# Patient Record
Sex: Male | Born: 1940
Health system: Southern US, Community
[De-identification: ages and names within clinical notes are randomized; demographics above are authoritative.]

## PROBLEM LIST (undated history)

## (undated) DIAGNOSIS — E785 Hyperlipidemia, unspecified: Secondary | ICD-10-CM

## (undated) DIAGNOSIS — G4733 Obstructive sleep apnea (adult) (pediatric): Secondary | ICD-10-CM

## (undated) DIAGNOSIS — I779 Disorder of arteries and arterioles, unspecified: Secondary | ICD-10-CM

## (undated) DIAGNOSIS — I071 Rheumatic tricuspid insufficiency: Secondary | ICD-10-CM

## (undated) DIAGNOSIS — E1142 Type 2 diabetes mellitus with diabetic polyneuropathy: Secondary | ICD-10-CM

## (undated) DIAGNOSIS — N200 Calculus of kidney: Secondary | ICD-10-CM

## (undated) DIAGNOSIS — I10 Essential (primary) hypertension: Secondary | ICD-10-CM

## (undated) DIAGNOSIS — G629 Polyneuropathy, unspecified: Secondary | ICD-10-CM

## (undated) DIAGNOSIS — I2510 Atherosclerotic heart disease of native coronary artery without angina pectoris: Secondary | ICD-10-CM

## (undated) DIAGNOSIS — I38 Endocarditis, valve unspecified: Secondary | ICD-10-CM

## (undated) DIAGNOSIS — G709 Myoneural disorder, unspecified: Secondary | ICD-10-CM

## (undated) DIAGNOSIS — M4317 Spondylolisthesis, lumbosacral region: Secondary | ICD-10-CM

## (undated) DIAGNOSIS — R161 Splenomegaly, not elsewhere classified: Secondary | ICD-10-CM

## (undated) DIAGNOSIS — K219 Gastro-esophageal reflux disease without esophagitis: Secondary | ICD-10-CM

## (undated) DIAGNOSIS — K760 Fatty (change of) liver, not elsewhere classified: Secondary | ICD-10-CM

## (undated) DIAGNOSIS — M170 Bilateral primary osteoarthritis of knee: Secondary | ICD-10-CM

## (undated) DIAGNOSIS — H269 Unspecified cataract: Secondary | ICD-10-CM

## (undated) DIAGNOSIS — G4730 Sleep apnea, unspecified: Secondary | ICD-10-CM

## (undated) DIAGNOSIS — C4490 Unspecified malignant neoplasm of skin, unspecified: Secondary | ICD-10-CM

## (undated) DIAGNOSIS — D1771 Benign lipomatous neoplasm of kidney: Secondary | ICD-10-CM

## (undated) DIAGNOSIS — I5189 Other ill-defined heart diseases: Secondary | ICD-10-CM

## (undated) DIAGNOSIS — E039 Hypothyroidism, unspecified: Secondary | ICD-10-CM

## (undated) DIAGNOSIS — K5790 Diverticulosis of intestine, part unspecified, without perforation or abscess without bleeding: Secondary | ICD-10-CM

## (undated) DIAGNOSIS — I872 Venous insufficiency (chronic) (peripheral): Secondary | ICD-10-CM

## (undated) DIAGNOSIS — I517 Cardiomegaly: Secondary | ICD-10-CM

## (undated) DIAGNOSIS — E119 Type 2 diabetes mellitus without complications: Secondary | ICD-10-CM

## (undated) DIAGNOSIS — K4090 Unilateral inguinal hernia, without obstruction or gangrene, not specified as recurrent: Secondary | ICD-10-CM

## (undated) DIAGNOSIS — I340 Nonrheumatic mitral (valve) insufficiency: Secondary | ICD-10-CM

## (undated) DIAGNOSIS — M1990 Unspecified osteoarthritis, unspecified site: Secondary | ICD-10-CM

## (undated) DIAGNOSIS — K449 Diaphragmatic hernia without obstruction or gangrene: Secondary | ICD-10-CM

## (undated) DIAGNOSIS — R001 Bradycardia, unspecified: Secondary | ICD-10-CM

## (undated) DIAGNOSIS — M47816 Spondylosis without myelopathy or radiculopathy, lumbar region: Secondary | ICD-10-CM

## (undated) HISTORY — PX: HERNIA REPAIR: SHX51

## (undated) HISTORY — DX: Sleep apnea, unspecified: G47.30

## (undated) HISTORY — PX: KNEE ARTHROSCOPY: SUR90

## (undated) HISTORY — DX: Unspecified osteoarthritis, unspecified site: M19.90

## (undated) HISTORY — DX: Hypothyroidism, unspecified: E03.9

## (undated) HISTORY — DX: Diverticulosis of intestine, part unspecified, without perforation or abscess without bleeding: K57.90

## (undated) HISTORY — DX: Hyperlipidemia, unspecified: E78.5

## (undated) HISTORY — PX: COLONOSCOPY: SHX174

## (undated) HISTORY — DX: Type 2 diabetes mellitus without complications: E11.9

## (undated) HISTORY — DX: Essential (primary) hypertension: I10

## (undated) HISTORY — DX: Atherosclerotic heart disease of native coronary artery without angina pectoris: I25.10

## (undated) HISTORY — DX: Unspecified cataract: H26.9

## (undated) HISTORY — DX: Myoneural disorder, unspecified: G70.9

## (undated) HISTORY — PX: CYST REMOVAL NECK: SHX6281

## (undated) HISTORY — PX: INGUINAL HERNIA REPAIR: SUR1180

## (undated) HISTORY — PX: POLYPECTOMY: SHX149

## (undated) HISTORY — PX: TONSILLECTOMY AND ADENOIDECTOMY: SUR1326

## (undated) HISTORY — PX: EYE SURGERY: SHX253

---

## 1976-10-31 HISTORY — PX: NASAL POLYP SURGERY: SHX186

## 1976-10-31 HISTORY — PX: OTHER SURGICAL HISTORY: SHX169

## 1998-10-31 HISTORY — PX: OTHER SURGICAL HISTORY: SHX169

## 2001-10-31 HISTORY — PX: KNEE ARTHROSCOPY: SUR90

## 2003-11-01 HISTORY — PX: KNEE ARTHROSCOPY: SUR90

## 2005-09-19 ENCOUNTER — Ambulatory Visit: Payer: Self-pay | Admitting: Specialist

## 2006-04-14 ENCOUNTER — Other Ambulatory Visit: Payer: Self-pay

## 2006-04-20 ENCOUNTER — Ambulatory Visit: Payer: Self-pay | Admitting: Orthopaedic Surgery

## 2007-12-19 ENCOUNTER — Ambulatory Visit: Payer: Self-pay | Admitting: Gastroenterology

## 2008-01-02 ENCOUNTER — Ambulatory Visit: Payer: Self-pay | Admitting: Gastroenterology

## 2008-01-29 ENCOUNTER — Ambulatory Visit: Payer: Self-pay

## 2008-02-21 ENCOUNTER — Other Ambulatory Visit: Payer: Self-pay

## 2008-02-21 ENCOUNTER — Ambulatory Visit: Payer: Self-pay | Admitting: Orthopaedic Surgery

## 2008-02-29 ENCOUNTER — Ambulatory Visit: Payer: Self-pay | Admitting: Orthopaedic Surgery

## 2010-10-31 HISTORY — PX: TIBIA FRACTURE SURGERY: SHX806

## 2011-04-01 ENCOUNTER — Ambulatory Visit: Payer: Self-pay | Admitting: Family Medicine

## 2011-04-06 ENCOUNTER — Ambulatory Visit: Payer: Self-pay | Admitting: Family Medicine

## 2011-05-05 ENCOUNTER — Ambulatory Visit: Payer: Self-pay | Admitting: Family Medicine

## 2011-05-24 ENCOUNTER — Ambulatory Visit: Payer: Self-pay

## 2011-10-18 ENCOUNTER — Ambulatory Visit: Payer: Self-pay | Admitting: Internal Medicine

## 2011-10-20 ENCOUNTER — Inpatient Hospital Stay: Payer: Self-pay | Admitting: Orthopedic Surgery

## 2011-11-01 HISTORY — PX: KNEE ARTHROSCOPY: SUR90

## 2011-11-02 DIAGNOSIS — S93409A Sprain of unspecified ligament of unspecified ankle, initial encounter: Secondary | ICD-10-CM | POA: Diagnosis not present

## 2011-11-23 DIAGNOSIS — S93409A Sprain of unspecified ligament of unspecified ankle, initial encounter: Secondary | ICD-10-CM | POA: Diagnosis not present

## 2011-11-30 DIAGNOSIS — M25669 Stiffness of unspecified knee, not elsewhere classified: Secondary | ICD-10-CM | POA: Diagnosis not present

## 2011-11-30 DIAGNOSIS — M6281 Muscle weakness (generalized): Secondary | ICD-10-CM | POA: Diagnosis not present

## 2011-11-30 DIAGNOSIS — M25619 Stiffness of unspecified shoulder, not elsewhere classified: Secondary | ICD-10-CM | POA: Diagnosis not present

## 2011-11-30 DIAGNOSIS — M25569 Pain in unspecified knee: Secondary | ICD-10-CM | POA: Diagnosis not present

## 2011-11-30 DIAGNOSIS — M7989 Other specified soft tissue disorders: Secondary | ICD-10-CM | POA: Diagnosis not present

## 2011-11-30 DIAGNOSIS — M25519 Pain in unspecified shoulder: Secondary | ICD-10-CM | POA: Diagnosis not present

## 2011-12-02 DIAGNOSIS — M25619 Stiffness of unspecified shoulder, not elsewhere classified: Secondary | ICD-10-CM | POA: Diagnosis not present

## 2011-12-02 DIAGNOSIS — M6281 Muscle weakness (generalized): Secondary | ICD-10-CM | POA: Diagnosis not present

## 2011-12-02 DIAGNOSIS — M25569 Pain in unspecified knee: Secondary | ICD-10-CM | POA: Diagnosis not present

## 2011-12-02 DIAGNOSIS — M25519 Pain in unspecified shoulder: Secondary | ICD-10-CM | POA: Diagnosis not present

## 2011-12-02 DIAGNOSIS — M25669 Stiffness of unspecified knee, not elsewhere classified: Secondary | ICD-10-CM | POA: Diagnosis not present

## 2011-12-02 DIAGNOSIS — M7989 Other specified soft tissue disorders: Secondary | ICD-10-CM | POA: Diagnosis not present

## 2011-12-07 DIAGNOSIS — M25579 Pain in unspecified ankle and joints of unspecified foot: Secondary | ICD-10-CM | POA: Diagnosis not present

## 2011-12-07 DIAGNOSIS — M25673 Stiffness of unspecified ankle, not elsewhere classified: Secondary | ICD-10-CM | POA: Diagnosis not present

## 2011-12-07 DIAGNOSIS — M7989 Other specified soft tissue disorders: Secondary | ICD-10-CM | POA: Diagnosis not present

## 2011-12-07 DIAGNOSIS — M6281 Muscle weakness (generalized): Secondary | ICD-10-CM | POA: Diagnosis not present

## 2011-12-07 DIAGNOSIS — M25676 Stiffness of unspecified foot, not elsewhere classified: Secondary | ICD-10-CM | POA: Diagnosis not present

## 2011-12-09 DIAGNOSIS — M25519 Pain in unspecified shoulder: Secondary | ICD-10-CM | POA: Diagnosis not present

## 2011-12-09 DIAGNOSIS — M6281 Muscle weakness (generalized): Secondary | ICD-10-CM | POA: Diagnosis not present

## 2011-12-09 DIAGNOSIS — M25619 Stiffness of unspecified shoulder, not elsewhere classified: Secondary | ICD-10-CM | POA: Diagnosis not present

## 2011-12-09 DIAGNOSIS — M7989 Other specified soft tissue disorders: Secondary | ICD-10-CM | POA: Diagnosis not present

## 2011-12-10 DIAGNOSIS — I10 Essential (primary) hypertension: Secondary | ICD-10-CM | POA: Diagnosis not present

## 2011-12-10 DIAGNOSIS — E119 Type 2 diabetes mellitus without complications: Secondary | ICD-10-CM | POA: Diagnosis not present

## 2011-12-10 DIAGNOSIS — E7800 Pure hypercholesterolemia, unspecified: Secondary | ICD-10-CM | POA: Diagnosis not present

## 2011-12-10 DIAGNOSIS — E669 Obesity, unspecified: Secondary | ICD-10-CM | POA: Diagnosis not present

## 2011-12-14 DIAGNOSIS — M25519 Pain in unspecified shoulder: Secondary | ICD-10-CM | POA: Diagnosis not present

## 2011-12-14 DIAGNOSIS — M25619 Stiffness of unspecified shoulder, not elsewhere classified: Secondary | ICD-10-CM | POA: Diagnosis not present

## 2011-12-14 DIAGNOSIS — M6281 Muscle weakness (generalized): Secondary | ICD-10-CM | POA: Diagnosis not present

## 2011-12-14 DIAGNOSIS — M7989 Other specified soft tissue disorders: Secondary | ICD-10-CM | POA: Diagnosis not present

## 2011-12-16 DIAGNOSIS — M7989 Other specified soft tissue disorders: Secondary | ICD-10-CM | POA: Diagnosis not present

## 2011-12-16 DIAGNOSIS — M25619 Stiffness of unspecified shoulder, not elsewhere classified: Secondary | ICD-10-CM | POA: Diagnosis not present

## 2011-12-16 DIAGNOSIS — M25519 Pain in unspecified shoulder: Secondary | ICD-10-CM | POA: Diagnosis not present

## 2011-12-16 DIAGNOSIS — M6281 Muscle weakness (generalized): Secondary | ICD-10-CM | POA: Diagnosis not present

## 2011-12-21 DIAGNOSIS — M6281 Muscle weakness (generalized): Secondary | ICD-10-CM | POA: Diagnosis not present

## 2011-12-21 DIAGNOSIS — M25519 Pain in unspecified shoulder: Secondary | ICD-10-CM | POA: Diagnosis not present

## 2011-12-21 DIAGNOSIS — M25619 Stiffness of unspecified shoulder, not elsewhere classified: Secondary | ICD-10-CM | POA: Diagnosis not present

## 2011-12-21 DIAGNOSIS — M7989 Other specified soft tissue disorders: Secondary | ICD-10-CM | POA: Diagnosis not present

## 2011-12-23 DIAGNOSIS — M6281 Muscle weakness (generalized): Secondary | ICD-10-CM | POA: Diagnosis not present

## 2011-12-23 DIAGNOSIS — M25519 Pain in unspecified shoulder: Secondary | ICD-10-CM | POA: Diagnosis not present

## 2011-12-23 DIAGNOSIS — M7989 Other specified soft tissue disorders: Secondary | ICD-10-CM | POA: Diagnosis not present

## 2011-12-23 DIAGNOSIS — M25619 Stiffness of unspecified shoulder, not elsewhere classified: Secondary | ICD-10-CM | POA: Diagnosis not present

## 2012-03-06 DIAGNOSIS — M25569 Pain in unspecified knee: Secondary | ICD-10-CM | POA: Diagnosis not present

## 2012-03-06 DIAGNOSIS — M2540 Effusion, unspecified joint: Secondary | ICD-10-CM | POA: Diagnosis not present

## 2012-03-14 DIAGNOSIS — M12569 Traumatic arthropathy, unspecified knee: Secondary | ICD-10-CM | POA: Diagnosis not present

## 2012-03-19 DIAGNOSIS — M1710 Unilateral primary osteoarthritis, unspecified knee: Secondary | ICD-10-CM | POA: Diagnosis not present

## 2012-03-19 DIAGNOSIS — M12569 Traumatic arthropathy, unspecified knee: Secondary | ICD-10-CM | POA: Diagnosis not present

## 2012-04-02 DIAGNOSIS — M1710 Unilateral primary osteoarthritis, unspecified knee: Secondary | ICD-10-CM | POA: Diagnosis not present

## 2012-04-02 DIAGNOSIS — M12569 Traumatic arthropathy, unspecified knee: Secondary | ICD-10-CM | POA: Diagnosis not present

## 2012-04-09 DIAGNOSIS — I10 Essential (primary) hypertension: Secondary | ICD-10-CM | POA: Diagnosis not present

## 2012-04-09 DIAGNOSIS — E119 Type 2 diabetes mellitus without complications: Secondary | ICD-10-CM | POA: Diagnosis not present

## 2012-04-09 DIAGNOSIS — E785 Hyperlipidemia, unspecified: Secondary | ICD-10-CM | POA: Diagnosis not present

## 2012-04-09 DIAGNOSIS — M1990 Unspecified osteoarthritis, unspecified site: Secondary | ICD-10-CM | POA: Diagnosis not present

## 2012-04-09 DIAGNOSIS — E039 Hypothyroidism, unspecified: Secondary | ICD-10-CM | POA: Diagnosis not present

## 2012-04-17 ENCOUNTER — Ambulatory Visit: Payer: Self-pay | Admitting: Orthopedic Surgery

## 2012-04-17 DIAGNOSIS — I10 Essential (primary) hypertension: Secondary | ICD-10-CM

## 2012-04-17 DIAGNOSIS — Z01812 Encounter for preprocedural laboratory examination: Secondary | ICD-10-CM | POA: Diagnosis not present

## 2012-04-17 DIAGNOSIS — Z01810 Encounter for preprocedural cardiovascular examination: Secondary | ICD-10-CM | POA: Diagnosis not present

## 2012-04-17 DIAGNOSIS — M25569 Pain in unspecified knee: Secondary | ICD-10-CM | POA: Diagnosis not present

## 2012-04-17 LAB — POTASSIUM: Potassium: 4.1 mmol/L (ref 3.5–5.1)

## 2012-04-20 ENCOUNTER — Ambulatory Visit: Payer: Self-pay | Admitting: Orthopedic Surgery

## 2012-04-20 DIAGNOSIS — E669 Obesity, unspecified: Secondary | ICD-10-CM | POA: Diagnosis not present

## 2012-04-20 DIAGNOSIS — M23305 Other meniscus derangements, unspecified medial meniscus, unspecified knee: Secondary | ICD-10-CM | POA: Diagnosis not present

## 2012-04-20 DIAGNOSIS — IMO0002 Reserved for concepts with insufficient information to code with codable children: Secondary | ICD-10-CM | POA: Diagnosis not present

## 2012-04-20 DIAGNOSIS — E079 Disorder of thyroid, unspecified: Secondary | ICD-10-CM | POA: Diagnosis not present

## 2012-04-20 DIAGNOSIS — E119 Type 2 diabetes mellitus without complications: Secondary | ICD-10-CM | POA: Diagnosis not present

## 2012-04-20 DIAGNOSIS — M129 Arthropathy, unspecified: Secondary | ICD-10-CM | POA: Diagnosis not present

## 2012-04-20 DIAGNOSIS — Z6839 Body mass index (BMI) 39.0-39.9, adult: Secondary | ICD-10-CM | POA: Diagnosis not present

## 2012-04-20 DIAGNOSIS — M23359 Other meniscus derangements, posterior horn of lateral meniscus, unspecified knee: Secondary | ICD-10-CM | POA: Diagnosis not present

## 2012-04-20 DIAGNOSIS — R609 Edema, unspecified: Secondary | ICD-10-CM | POA: Diagnosis not present

## 2012-04-20 DIAGNOSIS — M23349 Other meniscus derangements, anterior horn of lateral meniscus, unspecified knee: Secondary | ICD-10-CM | POA: Diagnosis not present

## 2012-04-20 DIAGNOSIS — Z7982 Long term (current) use of aspirin: Secondary | ICD-10-CM | POA: Diagnosis not present

## 2012-04-20 DIAGNOSIS — Z79899 Other long term (current) drug therapy: Secondary | ICD-10-CM | POA: Diagnosis not present

## 2012-04-20 DIAGNOSIS — Z87891 Personal history of nicotine dependence: Secondary | ICD-10-CM | POA: Diagnosis not present

## 2012-04-20 DIAGNOSIS — M1710 Unilateral primary osteoarthritis, unspecified knee: Secondary | ICD-10-CM | POA: Diagnosis not present

## 2012-04-20 DIAGNOSIS — I10 Essential (primary) hypertension: Secondary | ICD-10-CM | POA: Diagnosis not present

## 2012-06-18 DIAGNOSIS — T6391XA Toxic effect of contact with unspecified venomous animal, accidental (unintentional), initial encounter: Secondary | ICD-10-CM | POA: Diagnosis not present

## 2012-06-18 DIAGNOSIS — E119 Type 2 diabetes mellitus without complications: Secondary | ICD-10-CM | POA: Diagnosis not present

## 2012-06-18 DIAGNOSIS — I10 Essential (primary) hypertension: Secondary | ICD-10-CM | POA: Diagnosis not present

## 2012-06-18 DIAGNOSIS — M129 Arthropathy, unspecified: Secondary | ICD-10-CM | POA: Diagnosis not present

## 2012-06-18 DIAGNOSIS — Z23 Encounter for immunization: Secondary | ICD-10-CM | POA: Diagnosis not present

## 2012-07-25 DIAGNOSIS — E119 Type 2 diabetes mellitus without complications: Secondary | ICD-10-CM | POA: Diagnosis not present

## 2012-07-25 DIAGNOSIS — I10 Essential (primary) hypertension: Secondary | ICD-10-CM | POA: Diagnosis not present

## 2012-07-25 DIAGNOSIS — E785 Hyperlipidemia, unspecified: Secondary | ICD-10-CM | POA: Diagnosis not present

## 2012-07-25 DIAGNOSIS — M129 Arthropathy, unspecified: Secondary | ICD-10-CM | POA: Diagnosis not present

## 2012-08-21 DIAGNOSIS — Z23 Encounter for immunization: Secondary | ICD-10-CM | POA: Diagnosis not present

## 2012-08-21 DIAGNOSIS — I10 Essential (primary) hypertension: Secondary | ICD-10-CM | POA: Diagnosis not present

## 2012-08-21 DIAGNOSIS — M129 Arthropathy, unspecified: Secondary | ICD-10-CM | POA: Diagnosis not present

## 2012-10-29 DIAGNOSIS — E119 Type 2 diabetes mellitus without complications: Secondary | ICD-10-CM | POA: Diagnosis not present

## 2012-10-29 DIAGNOSIS — I10 Essential (primary) hypertension: Secondary | ICD-10-CM | POA: Diagnosis not present

## 2012-10-29 DIAGNOSIS — E7800 Pure hypercholesterolemia, unspecified: Secondary | ICD-10-CM | POA: Diagnosis not present

## 2012-10-29 DIAGNOSIS — M129 Arthropathy, unspecified: Secondary | ICD-10-CM | POA: Diagnosis not present

## 2012-12-05 DIAGNOSIS — Z87891 Personal history of nicotine dependence: Secondary | ICD-10-CM | POA: Diagnosis not present

## 2012-12-05 DIAGNOSIS — E119 Type 2 diabetes mellitus without complications: Secondary | ICD-10-CM | POA: Diagnosis not present

## 2012-12-05 DIAGNOSIS — T7840XA Allergy, unspecified, initial encounter: Secondary | ICD-10-CM | POA: Diagnosis not present

## 2012-12-05 DIAGNOSIS — R221 Localized swelling, mass and lump, neck: Secondary | ICD-10-CM | POA: Diagnosis not present

## 2012-12-05 DIAGNOSIS — I10 Essential (primary) hypertension: Secondary | ICD-10-CM | POA: Diagnosis not present

## 2012-12-05 DIAGNOSIS — T783XXA Angioneurotic edema, initial encounter: Secondary | ICD-10-CM | POA: Diagnosis not present

## 2012-12-05 DIAGNOSIS — Z7982 Long term (current) use of aspirin: Secondary | ICD-10-CM | POA: Diagnosis not present

## 2012-12-05 DIAGNOSIS — E039 Hypothyroidism, unspecified: Secondary | ICD-10-CM | POA: Diagnosis not present

## 2012-12-05 DIAGNOSIS — Z79899 Other long term (current) drug therapy: Secondary | ICD-10-CM | POA: Diagnosis not present

## 2012-12-05 DIAGNOSIS — Z800 Family history of malignant neoplasm of digestive organs: Secondary | ICD-10-CM | POA: Diagnosis not present

## 2012-12-05 DIAGNOSIS — R220 Localized swelling, mass and lump, head: Secondary | ICD-10-CM | POA: Diagnosis not present

## 2012-12-05 LAB — COMPREHENSIVE METABOLIC PANEL
Alkaline Phosphatase: 151 U/L — ABNORMAL HIGH (ref 50–136)
Anion Gap: 6 — ABNORMAL LOW (ref 7–16)
Chloride: 113 mmol/L — ABNORMAL HIGH (ref 98–107)
Co2: 24 mmol/L (ref 21–32)
Creatinine: 1.34 mg/dL — ABNORMAL HIGH (ref 0.60–1.30)
EGFR (African American): >60
EGFR (Non-African Amer.): 53 — ABNORMAL LOW
Glucose: 159 mg/dL — ABNORMAL HIGH (ref 65–99)
Osmolality: 298 (ref 275–301)
SGOT(AST): 24 U/L (ref 15–37)
SGPT (ALT): 34 U/L (ref 12–78)
Sodium: 143 mmol/L (ref 136–145)
Total Protein: 7.3 g/dL (ref 6.4–8.2)

## 2012-12-05 LAB — CBC
HGB: 14.5 g/dL (ref 13.0–18.0)
MCH: 30.1 pg (ref 26.0–34.0)
MCV: 90 fL (ref 80–100)
Platelet: 217 10*3/uL (ref 150–440)
RBC: 4.81 10*6/uL (ref 4.40–5.90)

## 2012-12-06 ENCOUNTER — Observation Stay: Payer: Self-pay | Admitting: Internal Medicine

## 2012-12-06 DIAGNOSIS — T783XXA Angioneurotic edema, initial encounter: Secondary | ICD-10-CM | POA: Diagnosis not present

## 2012-12-06 DIAGNOSIS — I10 Essential (primary) hypertension: Secondary | ICD-10-CM | POA: Diagnosis not present

## 2012-12-06 DIAGNOSIS — R21 Rash and other nonspecific skin eruption: Secondary | ICD-10-CM | POA: Diagnosis not present

## 2012-12-06 LAB — CBC WITH DIFFERENTIAL/PLATELET
Basophil #: 0 10*3/uL (ref 0.0–0.1)
Eosinophil #: 0 10*3/uL (ref 0.0–0.7)
HGB: 12.8 g/dL — ABNORMAL LOW (ref 13.0–18.0)
Lymphocyte #: 0.5 10*3/uL — ABNORMAL LOW (ref 1.0–3.6)
Lymphocyte %: 9.7 %
MCH: 30 pg (ref 26.0–34.0)
MCHC: 32.8 g/dL (ref 32.0–36.0)
MCV: 91 fL (ref 80–100)
Monocyte #: 0.1 x10 3/mm — ABNORMAL LOW (ref 0.2–1.0)
Neutrophil %: 88.6 %
Platelet: 163 10*3/uL (ref 150–440)
RBC: 4.26 10*6/uL — ABNORMAL LOW (ref 4.40–5.90)
RDW: 14.7 % — ABNORMAL HIGH (ref 11.5–14.5)
WBC: 5.2 10*3/uL (ref 3.8–10.6)

## 2012-12-06 LAB — BASIC METABOLIC PANEL
Anion Gap: 8 (ref 7–16)
BUN: 35 mg/dL — ABNORMAL HIGH (ref 7–18)
Calcium, Total: 10.1 mg/dL (ref 8.5–10.1)
Chloride: 114 mmol/L — ABNORMAL HIGH (ref 98–107)
Creatinine: 1.26 mg/dL (ref 0.60–1.30)
EGFR (African American): >60
Glucose: 327 mg/dL — ABNORMAL HIGH (ref 65–99)
Osmolality: 304 (ref 275–301)
Potassium: 4.3 mmol/L (ref 3.5–5.1)

## 2012-12-20 DIAGNOSIS — J309 Allergic rhinitis, unspecified: Secondary | ICD-10-CM | POA: Diagnosis not present

## 2012-12-20 DIAGNOSIS — I10 Essential (primary) hypertension: Secondary | ICD-10-CM | POA: Diagnosis not present

## 2012-12-20 DIAGNOSIS — E119 Type 2 diabetes mellitus without complications: Secondary | ICD-10-CM | POA: Diagnosis not present

## 2012-12-20 DIAGNOSIS — T783XXA Angioneurotic edema, initial encounter: Secondary | ICD-10-CM | POA: Diagnosis not present

## 2012-12-25 ENCOUNTER — Encounter: Payer: Self-pay | Admitting: Gastroenterology

## 2013-01-09 ENCOUNTER — Encounter: Payer: Self-pay | Admitting: Gastroenterology

## 2013-01-14 ENCOUNTER — Ambulatory Visit: Payer: Self-pay | Admitting: Family Medicine

## 2013-01-14 DIAGNOSIS — R05 Cough: Secondary | ICD-10-CM | POA: Diagnosis not present

## 2013-01-14 DIAGNOSIS — R059 Cough, unspecified: Secondary | ICD-10-CM | POA: Diagnosis not present

## 2013-01-14 DIAGNOSIS — R9431 Abnormal electrocardiogram [ECG] [EKG]: Secondary | ICD-10-CM | POA: Diagnosis not present

## 2013-01-14 DIAGNOSIS — I2510 Atherosclerotic heart disease of native coronary artery without angina pectoris: Secondary | ICD-10-CM | POA: Diagnosis not present

## 2013-01-14 DIAGNOSIS — E119 Type 2 diabetes mellitus without complications: Secondary | ICD-10-CM | POA: Diagnosis not present

## 2013-01-14 DIAGNOSIS — R079 Chest pain, unspecified: Secondary | ICD-10-CM | POA: Diagnosis not present

## 2013-01-14 DIAGNOSIS — I10 Essential (primary) hypertension: Secondary | ICD-10-CM | POA: Diagnosis not present

## 2013-01-21 DIAGNOSIS — E119 Type 2 diabetes mellitus without complications: Secondary | ICD-10-CM | POA: Diagnosis not present

## 2013-01-21 DIAGNOSIS — I209 Angina pectoris, unspecified: Secondary | ICD-10-CM | POA: Diagnosis not present

## 2013-01-21 DIAGNOSIS — E782 Mixed hyperlipidemia: Secondary | ICD-10-CM | POA: Diagnosis not present

## 2013-01-21 DIAGNOSIS — R0602 Shortness of breath: Secondary | ICD-10-CM | POA: Diagnosis not present

## 2013-02-05 DIAGNOSIS — I209 Angina pectoris, unspecified: Secondary | ICD-10-CM | POA: Diagnosis not present

## 2013-02-05 DIAGNOSIS — I119 Hypertensive heart disease without heart failure: Secondary | ICD-10-CM | POA: Diagnosis not present

## 2013-02-05 DIAGNOSIS — E782 Mixed hyperlipidemia: Secondary | ICD-10-CM | POA: Diagnosis not present

## 2013-02-05 DIAGNOSIS — R9430 Abnormal result of cardiovascular function study, unspecified: Secondary | ICD-10-CM | POA: Diagnosis not present

## 2013-02-13 ENCOUNTER — Ambulatory Visit: Payer: Self-pay | Admitting: Internal Medicine

## 2013-02-13 DIAGNOSIS — E785 Hyperlipidemia, unspecified: Secondary | ICD-10-CM | POA: Diagnosis not present

## 2013-02-13 DIAGNOSIS — Z87891 Personal history of nicotine dependence: Secondary | ICD-10-CM | POA: Diagnosis not present

## 2013-02-13 DIAGNOSIS — I209 Angina pectoris, unspecified: Secondary | ICD-10-CM | POA: Diagnosis not present

## 2013-02-13 DIAGNOSIS — E079 Disorder of thyroid, unspecified: Secondary | ICD-10-CM | POA: Diagnosis not present

## 2013-02-13 DIAGNOSIS — I2510 Atherosclerotic heart disease of native coronary artery without angina pectoris: Secondary | ICD-10-CM | POA: Diagnosis not present

## 2013-02-13 DIAGNOSIS — Z79899 Other long term (current) drug therapy: Secondary | ICD-10-CM | POA: Diagnosis not present

## 2013-02-13 DIAGNOSIS — I10 Essential (primary) hypertension: Secondary | ICD-10-CM | POA: Diagnosis not present

## 2013-02-13 DIAGNOSIS — Z800 Family history of malignant neoplasm of digestive organs: Secondary | ICD-10-CM | POA: Diagnosis not present

## 2013-02-13 DIAGNOSIS — Z7982 Long term (current) use of aspirin: Secondary | ICD-10-CM | POA: Diagnosis not present

## 2013-02-13 DIAGNOSIS — G4730 Sleep apnea, unspecified: Secondary | ICD-10-CM | POA: Diagnosis not present

## 2013-02-13 DIAGNOSIS — R9430 Abnormal result of cardiovascular function study, unspecified: Secondary | ICD-10-CM | POA: Diagnosis not present

## 2013-02-13 DIAGNOSIS — E119 Type 2 diabetes mellitus without complications: Secondary | ICD-10-CM | POA: Diagnosis not present

## 2013-02-13 DIAGNOSIS — Z8249 Family history of ischemic heart disease and other diseases of the circulatory system: Secondary | ICD-10-CM | POA: Diagnosis not present

## 2013-02-13 HISTORY — DX: Atherosclerotic heart disease of native coronary artery without angina pectoris: I25.10

## 2013-02-13 HISTORY — PX: LEFT HEART CATH AND CORONARY ANGIOGRAPHY: CATH118249

## 2013-02-14 ENCOUNTER — Ambulatory Visit: Payer: Self-pay | Admitting: Family Medicine

## 2013-02-14 DIAGNOSIS — J984 Other disorders of lung: Secondary | ICD-10-CM | POA: Diagnosis not present

## 2013-02-14 DIAGNOSIS — R918 Other nonspecific abnormal finding of lung field: Secondary | ICD-10-CM | POA: Diagnosis not present

## 2013-02-14 DIAGNOSIS — I2510 Atherosclerotic heart disease of native coronary artery without angina pectoris: Secondary | ICD-10-CM | POA: Diagnosis not present

## 2013-02-14 DIAGNOSIS — N289 Disorder of kidney and ureter, unspecified: Secondary | ICD-10-CM | POA: Diagnosis not present

## 2013-02-14 DIAGNOSIS — E119 Type 2 diabetes mellitus without complications: Secondary | ICD-10-CM | POA: Diagnosis not present

## 2013-02-14 DIAGNOSIS — I10 Essential (primary) hypertension: Secondary | ICD-10-CM | POA: Diagnosis not present

## 2013-02-14 DIAGNOSIS — E039 Hypothyroidism, unspecified: Secondary | ICD-10-CM | POA: Diagnosis not present

## 2013-02-27 DIAGNOSIS — G4730 Sleep apnea, unspecified: Secondary | ICD-10-CM | POA: Diagnosis not present

## 2013-02-27 DIAGNOSIS — I2510 Atherosclerotic heart disease of native coronary artery without angina pectoris: Secondary | ICD-10-CM | POA: Diagnosis not present

## 2013-02-27 DIAGNOSIS — I119 Hypertensive heart disease without heart failure: Secondary | ICD-10-CM | POA: Diagnosis not present

## 2013-02-27 DIAGNOSIS — E782 Mixed hyperlipidemia: Secondary | ICD-10-CM | POA: Diagnosis not present

## 2013-03-26 DIAGNOSIS — E785 Hyperlipidemia, unspecified: Secondary | ICD-10-CM | POA: Diagnosis not present

## 2013-03-26 DIAGNOSIS — I059 Rheumatic mitral valve disease, unspecified: Secondary | ICD-10-CM | POA: Diagnosis not present

## 2013-03-26 DIAGNOSIS — I10 Essential (primary) hypertension: Secondary | ICD-10-CM | POA: Diagnosis not present

## 2013-03-26 DIAGNOSIS — I2789 Other specified pulmonary heart diseases: Secondary | ICD-10-CM | POA: Diagnosis not present

## 2013-04-16 DIAGNOSIS — I10 Essential (primary) hypertension: Secondary | ICD-10-CM | POA: Diagnosis not present

## 2013-04-16 DIAGNOSIS — E119 Type 2 diabetes mellitus without complications: Secondary | ICD-10-CM | POA: Diagnosis not present

## 2013-04-16 DIAGNOSIS — E785 Hyperlipidemia, unspecified: Secondary | ICD-10-CM | POA: Diagnosis not present

## 2013-04-16 DIAGNOSIS — I2510 Atherosclerotic heart disease of native coronary artery without angina pectoris: Secondary | ICD-10-CM | POA: Diagnosis not present

## 2013-04-17 DIAGNOSIS — E785 Hyperlipidemia, unspecified: Secondary | ICD-10-CM | POA: Diagnosis not present

## 2013-04-17 DIAGNOSIS — I10 Essential (primary) hypertension: Secondary | ICD-10-CM | POA: Diagnosis not present

## 2013-05-02 DIAGNOSIS — H2510 Age-related nuclear cataract, unspecified eye: Secondary | ICD-10-CM | POA: Diagnosis not present

## 2013-05-14 DIAGNOSIS — G4730 Sleep apnea, unspecified: Secondary | ICD-10-CM | POA: Diagnosis not present

## 2013-05-14 DIAGNOSIS — I2510 Atherosclerotic heart disease of native coronary artery without angina pectoris: Secondary | ICD-10-CM | POA: Diagnosis not present

## 2013-05-14 DIAGNOSIS — E782 Mixed hyperlipidemia: Secondary | ICD-10-CM | POA: Diagnosis not present

## 2013-05-14 DIAGNOSIS — I119 Hypertensive heart disease without heart failure: Secondary | ICD-10-CM | POA: Diagnosis not present

## 2013-06-18 DIAGNOSIS — E119 Type 2 diabetes mellitus without complications: Secondary | ICD-10-CM | POA: Diagnosis not present

## 2013-06-18 DIAGNOSIS — I2510 Atherosclerotic heart disease of native coronary artery without angina pectoris: Secondary | ICD-10-CM | POA: Diagnosis not present

## 2013-06-18 DIAGNOSIS — E785 Hyperlipidemia, unspecified: Secondary | ICD-10-CM | POA: Diagnosis not present

## 2013-06-18 DIAGNOSIS — E669 Obesity, unspecified: Secondary | ICD-10-CM | POA: Diagnosis not present

## 2013-09-18 DIAGNOSIS — E782 Mixed hyperlipidemia: Secondary | ICD-10-CM | POA: Diagnosis not present

## 2013-09-18 DIAGNOSIS — E119 Type 2 diabetes mellitus without complications: Secondary | ICD-10-CM | POA: Diagnosis not present

## 2013-09-18 DIAGNOSIS — R609 Edema, unspecified: Secondary | ICD-10-CM | POA: Diagnosis not present

## 2013-09-18 DIAGNOSIS — I495 Sick sinus syndrome: Secondary | ICD-10-CM | POA: Diagnosis not present

## 2013-09-19 DIAGNOSIS — L600 Ingrowing nail: Secondary | ICD-10-CM | POA: Diagnosis not present

## 2013-09-19 DIAGNOSIS — E119 Type 2 diabetes mellitus without complications: Secondary | ICD-10-CM | POA: Diagnosis not present

## 2013-09-19 DIAGNOSIS — L02519 Cutaneous abscess of unspecified hand: Secondary | ICD-10-CM | POA: Diagnosis not present

## 2013-09-19 DIAGNOSIS — I2510 Atherosclerotic heart disease of native coronary artery without angina pectoris: Secondary | ICD-10-CM | POA: Diagnosis not present

## 2013-09-24 DIAGNOSIS — L03039 Cellulitis of unspecified toe: Secondary | ICD-10-CM | POA: Diagnosis not present

## 2013-09-24 DIAGNOSIS — L600 Ingrowing nail: Secondary | ICD-10-CM | POA: Diagnosis not present

## 2013-10-07 DIAGNOSIS — L600 Ingrowing nail: Secondary | ICD-10-CM | POA: Diagnosis not present

## 2013-10-14 DIAGNOSIS — L600 Ingrowing nail: Secondary | ICD-10-CM | POA: Diagnosis not present

## 2013-10-14 DIAGNOSIS — E119 Type 2 diabetes mellitus without complications: Secondary | ICD-10-CM | POA: Diagnosis not present

## 2013-10-14 DIAGNOSIS — E669 Obesity, unspecified: Secondary | ICD-10-CM | POA: Diagnosis not present

## 2013-10-14 DIAGNOSIS — Z1331 Encounter for screening for depression: Secondary | ICD-10-CM | POA: Diagnosis not present

## 2013-10-14 DIAGNOSIS — M129 Arthropathy, unspecified: Secondary | ICD-10-CM | POA: Diagnosis not present

## 2013-10-14 DIAGNOSIS — Z1330 Encounter for screening examination for mental health and behavioral disorders, unspecified: Secondary | ICD-10-CM | POA: Diagnosis not present

## 2013-10-14 DIAGNOSIS — L03039 Cellulitis of unspecified toe: Secondary | ICD-10-CM | POA: Diagnosis not present

## 2013-10-14 DIAGNOSIS — E7800 Pure hypercholesterolemia, unspecified: Secondary | ICD-10-CM | POA: Diagnosis not present

## 2013-10-14 DIAGNOSIS — I10 Essential (primary) hypertension: Secondary | ICD-10-CM | POA: Diagnosis not present

## 2014-01-14 DIAGNOSIS — I059 Rheumatic mitral valve disease, unspecified: Secondary | ICD-10-CM | POA: Diagnosis not present

## 2014-01-14 DIAGNOSIS — I2789 Other specified pulmonary heart diseases: Secondary | ICD-10-CM | POA: Diagnosis not present

## 2014-01-14 DIAGNOSIS — E785 Hyperlipidemia, unspecified: Secondary | ICD-10-CM | POA: Diagnosis not present

## 2014-01-14 DIAGNOSIS — I10 Essential (primary) hypertension: Secondary | ICD-10-CM | POA: Diagnosis not present

## 2014-02-17 DIAGNOSIS — E669 Obesity, unspecified: Secondary | ICD-10-CM | POA: Diagnosis not present

## 2014-02-17 DIAGNOSIS — I2510 Atherosclerotic heart disease of native coronary artery without angina pectoris: Secondary | ICD-10-CM | POA: Diagnosis not present

## 2014-02-17 DIAGNOSIS — E1149 Type 2 diabetes mellitus with other diabetic neurological complication: Secondary | ICD-10-CM | POA: Diagnosis not present

## 2014-02-17 DIAGNOSIS — I10 Essential (primary) hypertension: Secondary | ICD-10-CM | POA: Diagnosis not present

## 2014-03-03 DIAGNOSIS — I10 Essential (primary) hypertension: Secondary | ICD-10-CM | POA: Diagnosis not present

## 2014-03-03 DIAGNOSIS — E119 Type 2 diabetes mellitus without complications: Secondary | ICD-10-CM | POA: Diagnosis not present

## 2014-03-03 DIAGNOSIS — E7800 Pure hypercholesterolemia, unspecified: Secondary | ICD-10-CM | POA: Diagnosis not present

## 2014-03-03 DIAGNOSIS — E669 Obesity, unspecified: Secondary | ICD-10-CM | POA: Diagnosis not present

## 2014-05-19 DIAGNOSIS — E785 Hyperlipidemia, unspecified: Secondary | ICD-10-CM | POA: Diagnosis not present

## 2014-05-19 DIAGNOSIS — I10 Essential (primary) hypertension: Secondary | ICD-10-CM | POA: Diagnosis not present

## 2014-05-19 DIAGNOSIS — I2510 Atherosclerotic heart disease of native coronary artery without angina pectoris: Secondary | ICD-10-CM | POA: Diagnosis not present

## 2014-05-19 DIAGNOSIS — E119 Type 2 diabetes mellitus without complications: Secondary | ICD-10-CM | POA: Diagnosis not present

## 2014-05-27 ENCOUNTER — Encounter: Payer: Self-pay | Admitting: Gastroenterology

## 2014-06-11 ENCOUNTER — Encounter: Payer: Self-pay | Admitting: Gastroenterology

## 2014-06-16 DIAGNOSIS — Z23 Encounter for immunization: Secondary | ICD-10-CM | POA: Diagnosis not present

## 2014-06-16 DIAGNOSIS — E039 Hypothyroidism, unspecified: Secondary | ICD-10-CM | POA: Diagnosis not present

## 2014-06-16 DIAGNOSIS — E785 Hyperlipidemia, unspecified: Secondary | ICD-10-CM | POA: Diagnosis not present

## 2014-06-16 DIAGNOSIS — E1149 Type 2 diabetes mellitus with other diabetic neurological complication: Secondary | ICD-10-CM | POA: Diagnosis not present

## 2014-06-16 DIAGNOSIS — I10 Essential (primary) hypertension: Secondary | ICD-10-CM | POA: Diagnosis not present

## 2014-06-16 DIAGNOSIS — N529 Male erectile dysfunction, unspecified: Secondary | ICD-10-CM | POA: Diagnosis not present

## 2014-06-16 LAB — LIPID PANEL
Cholesterol: 101 mg/dL (ref 0–200)
HDL: 28 mg/dL — AB (ref 35–70)
LDL CALC: 29 mg/dL
LDL/HDL RATIO: 1
Triglycerides: 221 mg/dL — AB (ref 40–160)

## 2014-07-08 ENCOUNTER — Ambulatory Visit: Payer: Self-pay | Admitting: Family Medicine

## 2014-07-08 DIAGNOSIS — Z23 Encounter for immunization: Secondary | ICD-10-CM | POA: Diagnosis not present

## 2014-07-08 DIAGNOSIS — M129 Arthropathy, unspecified: Secondary | ICD-10-CM | POA: Diagnosis not present

## 2014-07-08 DIAGNOSIS — N529 Male erectile dysfunction, unspecified: Secondary | ICD-10-CM | POA: Diagnosis not present

## 2014-07-08 DIAGNOSIS — E119 Type 2 diabetes mellitus without complications: Secondary | ICD-10-CM | POA: Diagnosis not present

## 2014-07-08 DIAGNOSIS — I2510 Atherosclerotic heart disease of native coronary artery without angina pectoris: Secondary | ICD-10-CM | POA: Diagnosis not present

## 2014-07-08 DIAGNOSIS — E785 Hyperlipidemia, unspecified: Secondary | ICD-10-CM | POA: Diagnosis not present

## 2014-07-08 LAB — PSA: PSA: 2.9

## 2014-07-22 DIAGNOSIS — E785 Hyperlipidemia, unspecified: Secondary | ICD-10-CM | POA: Diagnosis not present

## 2014-07-22 DIAGNOSIS — E1129 Type 2 diabetes mellitus with other diabetic kidney complication: Secondary | ICD-10-CM | POA: Diagnosis not present

## 2014-07-22 DIAGNOSIS — Z23 Encounter for immunization: Secondary | ICD-10-CM | POA: Diagnosis not present

## 2014-07-22 DIAGNOSIS — N529 Male erectile dysfunction, unspecified: Secondary | ICD-10-CM | POA: Diagnosis not present

## 2014-07-22 DIAGNOSIS — I10 Essential (primary) hypertension: Secondary | ICD-10-CM | POA: Diagnosis not present

## 2014-07-31 ENCOUNTER — Ambulatory Visit (AMBULATORY_SURGERY_CENTER): Payer: Self-pay | Admitting: *Deleted

## 2014-07-31 VITALS — Ht 69.0 in | Wt 250.6 lb

## 2014-07-31 DIAGNOSIS — Z800 Family history of malignant neoplasm of digestive organs: Secondary | ICD-10-CM

## 2014-07-31 MED ORDER — MOVIPREP 100 G PO SOLR: ORAL | Status: DC

## 2014-07-31 NOTE — Progress Notes (Signed)
No allergies to eggs or soy. No problems with anesthesia.  Pt given Emmi instructions for colonoscopy  No oxygen use  No diet drug use  

## 2014-08-14 ENCOUNTER — Encounter: Payer: Self-pay | Admitting: Gastroenterology

## 2014-08-14 ENCOUNTER — Ambulatory Visit (AMBULATORY_SURGERY_CENTER): Payer: Medicare Other | Admitting: Gastroenterology

## 2014-08-14 VITALS — BP 136/71 | HR 51 | Temp 97.6°F | Resp 17 | Ht 69.0 in | Wt 250.0 lb

## 2014-08-14 DIAGNOSIS — D120 Benign neoplasm of cecum: Secondary | ICD-10-CM

## 2014-08-14 DIAGNOSIS — I2510 Atherosclerotic heart disease of native coronary artery without angina pectoris: Secondary | ICD-10-CM | POA: Diagnosis not present

## 2014-08-14 DIAGNOSIS — Z1211 Encounter for screening for malignant neoplasm of colon: Secondary | ICD-10-CM

## 2014-08-14 DIAGNOSIS — E119 Type 2 diabetes mellitus without complications: Secondary | ICD-10-CM | POA: Diagnosis not present

## 2014-08-14 DIAGNOSIS — D128 Benign neoplasm of rectum: Secondary | ICD-10-CM

## 2014-08-14 DIAGNOSIS — Z800 Family history of malignant neoplasm of digestive organs: Secondary | ICD-10-CM

## 2014-08-14 LAB — HM COLONOSCOPY

## 2014-08-14 MED ORDER — SODIUM CHLORIDE 0.9 % IV SOLN: 500.0000 mL | INTRAVENOUS | Status: DC

## 2014-08-14 NOTE — Op Note (Signed)
[facility]  [address] [place], [id number]   COLONOSCOPY PROCEDURE REPORT  PATIENT: Zachary Martinez, Zachary Martinez  MR#: [id number] BIRTHDATE: 1941-07-14 , 73  yrs. old GENDER: male ENDOSCOPIST: Ladene Artist, MD, [facility] PROCEDURE DATE:  08/14/2014 PROCEDURE:   Colonoscopy with biopsy First Screening Colonoscopy - Avg.  risk and is 50 yrs.  old or older - No.  Prior Negative Screening - Now for repeat screening. N/A  History of Adenoma - Now for follow-up colonoscopy & has been > or = to 3 yrs.  N/A  Polyps Removed Today? Yes. ASA CLASS:   Class II INDICATIONS:patient's immediate family history of colon cancer and patient's family history of colon cancer, distant relatives. MEDICATIONS: Monitored anesthesia care and Propofol 300 mg IV DESCRIPTION OF PROCEDURE:   After the risks benefits and alternatives of the procedure were thoroughly explained, informed consent was obtained.  The digital rectal exam revealed no abnormalities of the rectum.   The LB [id number]  endoscope was introduced through the anus and advanced to the cecum, which was identified by both the appendix and ileocecal valve. No adverse events experienced.   The quality of the prep was good, using MoviPrep  The instrument was then slowly withdrawn as the colon was fully examined.  COLON FINDINGS: There was moderate diverticulosis noted in the sigmoid colon and descending colon with associated colonic spasm and muscular hypertrophy.   A sessile polyp measuring 5 mm in size was found at the cecum.  A polypectomy was performed with cold forceps.  The resection was complete, the polyp tissue was completely retrieved and sent to histology.   A sessile polyp measuring 4 mm in size was found in the rectum.  A polypectomy was performed with cold forceps.  The resection was complete, the polyp tissue was completely retrieved and sent to histology.   The examination was otherwise normal.  Retroflexed views  revealed internal Grade II hemorrhoids. The time to cecum=4 minutes 13 seconds.  Withdrawal time=11 minutes 26 seconds.  The scope was withdrawn and the procedure completed. COMPLICATIONS: There were no immediate complications.  ENDOSCOPIC IMPRESSION: 1.   Moderate diverticulosis in the sigmoid colon and descending colon 2.   Sessile polyp at the cecum; polypectomy performed with cold forceps 3.   Sessile polyp in the rectum; polypectomy performed with cold forceps 4.   Grade II internal hemorrhoids  RECOMMENDATIONS: 1.  Await pathology results 2.  High fiber diet with liberal fluid intake. 3.  Repeat Colonoscopy in 5 years.  eSigned:  Ladene Artist, MD, [facility] 08/14/2014 11:50 AM   cc: Miguel Aschoff, MD

## 2014-08-14 NOTE — Progress Notes (Signed)
Called to room to assist during endoscopic procedure.  Patient ID and intended procedure confirmed with present staff. Received instructions for my participation in the procedure from the performing physician.  

## 2014-08-14 NOTE — Patient Instructions (Signed)
YOU HAD AN ENDOSCOPIC PROCEDURE TODAY AT THE [facility]: Refer to the procedure report that was given to you for any specific questions about what was found during the examination.  If the procedure report does not answer your questions, please call your gastroenterologist to clarify.  If you requested that your care partner not be given the details of your procedure findings, then the procedure report has been included in a sealed envelope for you to review at your convenience later.  YOU SHOULD EXPECT: Some feelings of bloating in the abdomen. Passage of more gas than usual.  Walking can help get rid of the air that was put into your GI tract during the procedure and reduce the bloating. If you had a lower endoscopy (such as a colonoscopy or flexible sigmoidoscopy) you may notice spotting of blood in your stool or on the toilet paper. If you underwent a bowel prep for your procedure, then you may not have a normal bowel movement for a few days.  DIET: Your first meal following the procedure should be a light meal and then it is ok to progress to your normal diet.  A half-sandwich or bowl of soup is an example of a good first meal.  Heavy or fried foods are harder to digest and may make you feel nauseous or bloated.  Likewise meals heavy in dairy and vegetables can cause extra gas to form and this can also increase the bloating.  Drink plenty of fluids but you should avoid alcoholic beverages for 24 hours.  ACTIVITY: Your care partner should take you home directly after the procedure.  You should plan to take it easy, moving slowly for the rest of the day.  You can resume normal activity the day after the procedure however you should NOT DRIVE or use heavy machinery for 24 hours (because of the sedation medicines used during the test).    SYMPTOMS TO REPORT IMMEDIATELY: A gastroenterologist can be reached at any hour.  During normal business hours, 8:30 AM to 5:00 PM Monday through Friday,  call [phone].  After hours and on weekends, please call the GI answering service at [phone] who will take a message and have the physician on call contact you.   Following lower endoscopy (colonoscopy or flexible sigmoidoscopy):  Excessive amounts of blood in the stool  Significant tenderness or worsening of abdominal pains  Swelling of the abdomen that is new, acute  Fever of 100F or higher  FOLLOW UP: If any biopsies were taken you will be contacted by phone or by letter within the next 1-3 weeks.  Call your gastroenterologist if you have not heard about the biopsies in 3 weeks.  Our staff will call the home number listed on your records the next business day following your procedure to check on you and address any questions or concerns that you may have at that time regarding the information given to you following your procedure. This is a courtesy call and so if there is no answer at the home number and we have not heard from you through the emergency physician on call, we will assume that you have returned to your regular daily activities without incident.  SIGNATURES/CONFIDENTIALITY: You and/or your care partner have signed paperwork which will be entered into your electronic medical record.  These signatures attest to the fact that that the information above on your After Visit Summary has been reviewed and is understood.  Full responsibility of the confidentiality of this   discharge information lies with you and/or your care-partner.   Resume medications. Information given on polyps, diverticulosis, hemorrhoids and high fiber diet.

## 2014-08-14 NOTE — Progress Notes (Signed)
Patient awakening,vss,report to rn 

## 2014-08-15 ENCOUNTER — Telehealth: Payer: Self-pay | Admitting: *Deleted

## 2014-08-15 NOTE — Telephone Encounter (Signed)
  Follow up Call-  Call back number 08/14/2014  Post procedure Call Back phone  # [phone]  Permission to leave phone message Yes     Patient questions:  Do you have a fever, pain , or abdominal swelling? No. Pain Score  0 *  Have you tolerated food without any problems? Yes.    Have you been able to return to your normal activities? Yes.    Do you have any questions about your discharge instructions: Diet   No. Medications  No. Follow up visit  No.  Do you have questions or concerns about your Care? No.  Actions: * If pain score is 4 or above: No action needed, pain <4.

## 2014-08-20 ENCOUNTER — Encounter: Payer: Self-pay | Admitting: Gastroenterology

## 2014-10-14 DIAGNOSIS — M25511 Pain in right shoulder: Secondary | ICD-10-CM | POA: Diagnosis not present

## 2014-10-14 DIAGNOSIS — I10 Essential (primary) hypertension: Secondary | ICD-10-CM | POA: Diagnosis not present

## 2014-10-14 DIAGNOSIS — E780 Pure hypercholesterolemia: Secondary | ICD-10-CM | POA: Diagnosis not present

## 2014-10-14 DIAGNOSIS — E119 Type 2 diabetes mellitus without complications: Secondary | ICD-10-CM | POA: Diagnosis not present

## 2014-10-27 DIAGNOSIS — M75111 Incomplete rotator cuff tear or rupture of right shoulder, not specified as traumatic: Secondary | ICD-10-CM | POA: Diagnosis not present

## 2014-10-27 DIAGNOSIS — M25511 Pain in right shoulder: Secondary | ICD-10-CM | POA: Diagnosis not present

## 2014-11-05 DIAGNOSIS — H2513 Age-related nuclear cataract, bilateral: Secondary | ICD-10-CM | POA: Diagnosis not present

## 2014-11-11 DIAGNOSIS — M25511 Pain in right shoulder: Secondary | ICD-10-CM | POA: Diagnosis not present

## 2014-11-13 DIAGNOSIS — M75111 Incomplete rotator cuff tear or rupture of right shoulder, not specified as traumatic: Secondary | ICD-10-CM | POA: Diagnosis not present

## 2014-11-18 DIAGNOSIS — M75111 Incomplete rotator cuff tear or rupture of right shoulder, not specified as traumatic: Secondary | ICD-10-CM | POA: Diagnosis not present

## 2014-11-20 DIAGNOSIS — M75111 Incomplete rotator cuff tear or rupture of right shoulder, not specified as traumatic: Secondary | ICD-10-CM | POA: Diagnosis not present

## 2014-11-24 DIAGNOSIS — G4733 Obstructive sleep apnea (adult) (pediatric): Secondary | ICD-10-CM | POA: Diagnosis not present

## 2014-11-24 DIAGNOSIS — I119 Hypertensive heart disease without heart failure: Secondary | ICD-10-CM | POA: Insufficient documentation

## 2014-11-24 DIAGNOSIS — I10 Essential (primary) hypertension: Secondary | ICD-10-CM | POA: Diagnosis not present

## 2014-11-24 DIAGNOSIS — I340 Nonrheumatic mitral (valve) insufficiency: Secondary | ICD-10-CM | POA: Insufficient documentation

## 2014-11-24 DIAGNOSIS — E782 Mixed hyperlipidemia: Secondary | ICD-10-CM | POA: Diagnosis not present

## 2014-11-25 DIAGNOSIS — M75111 Incomplete rotator cuff tear or rupture of right shoulder, not specified as traumatic: Secondary | ICD-10-CM | POA: Diagnosis not present

## 2014-11-27 DIAGNOSIS — M75111 Incomplete rotator cuff tear or rupture of right shoulder, not specified as traumatic: Secondary | ICD-10-CM | POA: Diagnosis not present

## 2014-12-01 DIAGNOSIS — R0602 Shortness of breath: Secondary | ICD-10-CM | POA: Diagnosis not present

## 2014-12-01 DIAGNOSIS — E782 Mixed hyperlipidemia: Secondary | ICD-10-CM | POA: Diagnosis not present

## 2014-12-01 DIAGNOSIS — I2510 Atherosclerotic heart disease of native coronary artery without angina pectoris: Secondary | ICD-10-CM | POA: Diagnosis not present

## 2014-12-01 DIAGNOSIS — R609 Edema, unspecified: Secondary | ICD-10-CM | POA: Diagnosis not present

## 2014-12-01 DIAGNOSIS — I10 Essential (primary) hypertension: Secondary | ICD-10-CM | POA: Diagnosis not present

## 2014-12-01 DIAGNOSIS — R600 Localized edema: Secondary | ICD-10-CM | POA: Diagnosis not present

## 2014-12-15 ENCOUNTER — Ambulatory Visit: Payer: Self-pay | Admitting: Orthopedic Surgery

## 2014-12-15 DIAGNOSIS — S46211A Strain of muscle, fascia and tendon of other parts of biceps, right arm, initial encounter: Secondary | ICD-10-CM | POA: Diagnosis not present

## 2014-12-15 DIAGNOSIS — M24111 Other articular cartilage disorders, right shoulder: Secondary | ICD-10-CM | POA: Diagnosis not present

## 2014-12-15 DIAGNOSIS — S46811A Strain of other muscles, fascia and tendons at shoulder and upper arm level, right arm, initial encounter: Secondary | ICD-10-CM | POA: Diagnosis not present

## 2014-12-22 DIAGNOSIS — M67919 Unspecified disorder of synovium and tendon, unspecified shoulder: Secondary | ICD-10-CM | POA: Insufficient documentation

## 2014-12-22 DIAGNOSIS — M65811 Other synovitis and tenosynovitis, right shoulder: Secondary | ICD-10-CM | POA: Diagnosis not present

## 2014-12-22 DIAGNOSIS — M75121 Complete rotator cuff tear or rupture of right shoulder, not specified as traumatic: Secondary | ICD-10-CM | POA: Diagnosis not present

## 2014-12-22 DIAGNOSIS — M719 Bursopathy, unspecified: Secondary | ICD-10-CM

## 2014-12-30 ENCOUNTER — Ambulatory Visit: Payer: Self-pay | Admitting: Surgery

## 2014-12-30 DIAGNOSIS — I2510 Atherosclerotic heart disease of native coronary artery without angina pectoris: Secondary | ICD-10-CM | POA: Diagnosis not present

## 2014-12-30 DIAGNOSIS — M75101 Unspecified rotator cuff tear or rupture of right shoulder, not specified as traumatic: Secondary | ICD-10-CM | POA: Diagnosis not present

## 2014-12-30 DIAGNOSIS — Z01812 Encounter for preprocedural laboratory examination: Secondary | ICD-10-CM | POA: Diagnosis not present

## 2014-12-30 DIAGNOSIS — Z01810 Encounter for preprocedural cardiovascular examination: Secondary | ICD-10-CM | POA: Diagnosis not present

## 2014-12-31 DIAGNOSIS — E119 Type 2 diabetes mellitus without complications: Secondary | ICD-10-CM | POA: Diagnosis not present

## 2014-12-31 DIAGNOSIS — I2510 Atherosclerotic heart disease of native coronary artery without angina pectoris: Secondary | ICD-10-CM | POA: Diagnosis not present

## 2014-12-31 DIAGNOSIS — E780 Pure hypercholesterolemia: Secondary | ICD-10-CM | POA: Diagnosis not present

## 2015-01-01 ENCOUNTER — Ambulatory Visit: Payer: Self-pay | Admitting: Surgery

## 2015-01-01 DIAGNOSIS — I10 Essential (primary) hypertension: Secondary | ICD-10-CM | POA: Diagnosis not present

## 2015-01-01 DIAGNOSIS — M65811 Other synovitis and tenosynovitis, right shoulder: Secondary | ICD-10-CM | POA: Diagnosis not present

## 2015-01-01 DIAGNOSIS — Z79899 Other long term (current) drug therapy: Secondary | ICD-10-CM | POA: Diagnosis not present

## 2015-01-01 DIAGNOSIS — E118 Type 2 diabetes mellitus with unspecified complications: Secondary | ICD-10-CM | POA: Diagnosis not present

## 2015-01-01 DIAGNOSIS — M25511 Pain in right shoulder: Secondary | ICD-10-CM | POA: Diagnosis not present

## 2015-01-01 DIAGNOSIS — M75101 Unspecified rotator cuff tear or rupture of right shoulder, not specified as traumatic: Secondary | ICD-10-CM | POA: Diagnosis not present

## 2015-01-01 DIAGNOSIS — S43421A Sprain of right rotator cuff capsule, initial encounter: Secondary | ICD-10-CM | POA: Diagnosis not present

## 2015-01-01 DIAGNOSIS — Z800 Family history of malignant neoplasm of digestive organs: Secondary | ICD-10-CM | POA: Diagnosis not present

## 2015-01-01 DIAGNOSIS — G4730 Sleep apnea, unspecified: Secondary | ICD-10-CM | POA: Diagnosis not present

## 2015-01-01 DIAGNOSIS — Z8249 Family history of ischemic heart disease and other diseases of the circulatory system: Secondary | ICD-10-CM | POA: Diagnosis not present

## 2015-01-01 DIAGNOSIS — Z87891 Personal history of nicotine dependence: Secondary | ICD-10-CM | POA: Diagnosis not present

## 2015-01-01 DIAGNOSIS — Z888 Allergy status to other drugs, medicaments and biological substances status: Secondary | ICD-10-CM | POA: Diagnosis not present

## 2015-01-01 DIAGNOSIS — M75121 Complete rotator cuff tear or rupture of right shoulder, not specified as traumatic: Secondary | ICD-10-CM | POA: Diagnosis not present

## 2015-01-01 DIAGNOSIS — Z9889 Other specified postprocedural states: Secondary | ICD-10-CM | POA: Diagnosis not present

## 2015-01-01 DIAGNOSIS — M7521 Bicipital tendinitis, right shoulder: Secondary | ICD-10-CM | POA: Diagnosis not present

## 2015-01-01 DIAGNOSIS — I2510 Atherosclerotic heart disease of native coronary artery without angina pectoris: Secondary | ICD-10-CM | POA: Diagnosis not present

## 2015-01-01 DIAGNOSIS — E785 Hyperlipidemia, unspecified: Secondary | ICD-10-CM | POA: Diagnosis not present

## 2015-01-01 DIAGNOSIS — S43431A Superior glenoid labrum lesion of right shoulder, initial encounter: Secondary | ICD-10-CM | POA: Diagnosis not present

## 2015-01-01 DIAGNOSIS — G8918 Other acute postprocedural pain: Secondary | ICD-10-CM | POA: Diagnosis not present

## 2015-01-01 DIAGNOSIS — Z7982 Long term (current) use of aspirin: Secondary | ICD-10-CM | POA: Diagnosis not present

## 2015-01-07 DIAGNOSIS — M75111 Incomplete rotator cuff tear or rupture of right shoulder, not specified as traumatic: Secondary | ICD-10-CM | POA: Diagnosis not present

## 2015-01-14 DIAGNOSIS — M75111 Incomplete rotator cuff tear or rupture of right shoulder, not specified as traumatic: Secondary | ICD-10-CM | POA: Diagnosis not present

## 2015-01-21 DIAGNOSIS — M75111 Incomplete rotator cuff tear or rupture of right shoulder, not specified as traumatic: Secondary | ICD-10-CM | POA: Diagnosis not present

## 2015-01-24 ENCOUNTER — Emergency Department: Payer: Self-pay | Admitting: Emergency Medicine

## 2015-01-24 DIAGNOSIS — E119 Type 2 diabetes mellitus without complications: Secondary | ICD-10-CM | POA: Diagnosis not present

## 2015-01-24 DIAGNOSIS — S81812A Laceration without foreign body, left lower leg, initial encounter: Secondary | ICD-10-CM | POA: Diagnosis not present

## 2015-01-24 DIAGNOSIS — I10 Essential (primary) hypertension: Secondary | ICD-10-CM | POA: Diagnosis not present

## 2015-01-28 DIAGNOSIS — M75111 Incomplete rotator cuff tear or rupture of right shoulder, not specified as traumatic: Secondary | ICD-10-CM | POA: Diagnosis not present

## 2015-02-04 DIAGNOSIS — M75111 Incomplete rotator cuff tear or rupture of right shoulder, not specified as traumatic: Secondary | ICD-10-CM | POA: Diagnosis not present

## 2015-02-05 DIAGNOSIS — E119 Type 2 diabetes mellitus without complications: Secondary | ICD-10-CM | POA: Diagnosis not present

## 2015-02-05 DIAGNOSIS — E780 Pure hypercholesterolemia: Secondary | ICD-10-CM | POA: Diagnosis not present

## 2015-02-05 DIAGNOSIS — I10 Essential (primary) hypertension: Secondary | ICD-10-CM | POA: Diagnosis not present

## 2015-02-05 DIAGNOSIS — E1143 Type 2 diabetes mellitus with diabetic autonomic (poly)neuropathy: Secondary | ICD-10-CM | POA: Diagnosis not present

## 2015-02-05 DIAGNOSIS — E669 Obesity, unspecified: Secondary | ICD-10-CM | POA: Diagnosis not present

## 2015-02-05 LAB — CBC AND DIFFERENTIAL
HEMATOCRIT: 42 % (ref 41–53)
Hemoglobin: 13.8 g/dL (ref 13.5–17.5)
NEUTROS ABS: 3 /uL
PLATELETS: 208 10*3/uL (ref 150–399)
WBC: 5.2 10*3/mL

## 2015-02-05 LAB — BASIC METABOLIC PANEL
BUN: 18 mg/dL (ref 4–21)
CREATININE: 0.7 mg/dL (ref 0.6–1.3)
Glucose: 183 mg/dL
Potassium: 4.4 mmol/L (ref 3.4–5.3)
SODIUM: 143 mmol/L (ref 137–147)

## 2015-02-05 LAB — TSH: TSH: 2.99 u[IU]/mL (ref 0.41–5.90)

## 2015-02-05 LAB — HEPATIC FUNCTION PANEL
ALT: 16 U/L (ref 10–40)
AST: 15 U/L (ref 14–40)
Alkaline Phosphatase: 104 U/L (ref 25–125)
BILIRUBIN, TOTAL: 0.3 mg/dL

## 2015-02-11 DIAGNOSIS — M75111 Incomplete rotator cuff tear or rupture of right shoulder, not specified as traumatic: Secondary | ICD-10-CM | POA: Diagnosis not present

## 2015-02-16 DIAGNOSIS — M75111 Incomplete rotator cuff tear or rupture of right shoulder, not specified as traumatic: Secondary | ICD-10-CM | POA: Diagnosis not present

## 2015-02-19 DIAGNOSIS — M25511 Pain in right shoulder: Secondary | ICD-10-CM | POA: Diagnosis not present

## 2015-02-20 NOTE — Consult Note (Signed)
PATIENT NAME:  Zachary Martinez, Zachary Martinez MR#:  [id number] DATE OF BIRTH:  01/18/1941  DATE OF CONSULTATION:  12/05/2012  REFERRING PHYSICIAN: Sheryl L. Benjaman Lobe, MD CONSULTING PHYSICIAN:  Sammuel Hines. Richardson Landry, MD  REASON FOR CONSULTATION: Throat swelling.   HISTORY OF PRESENTING ILLNESS: This 74 year old male was brought into the Emergency Room this evening with swelling of his tongue as well as hives and itching involving the abdomen. He is on lisinopril, but the rash and itching were a little bit uncharacteristic of angioedema. He does not have any known allergies. He did not take any unusual medications beyond his normal medications today, except for an ibuprofen earlier in the day, but all the medications were taken in the morning, none of them have been taken in the evening. When this started, he had just finished drinking milk and eating some crackers. He had not had any exposure to peanuts or shrimp. He had had spaghetti for dinner, really nothing out of the ordinary. When I was called, he had had some impressive tongue swelling, making it hard for Dr. Benjaman Lobe to see the posterior pharynx beyond the swollen tongue and was feeling like his throat was swelling. When he was evaluated here, he was treated with a dose of epinephrine, Solu-Medrol, Zantac and Benadryl. Since I was called, apparently the tongue swelling has improved, and he is feeling like his throat is better and denies any difficulty breathing at the moment.   PAST MEDICAL HISTORY: Hypothyroidism, diabetes, hypertension. He recently had some issues of abdominal discomfort and diarrhea after a trip out of the country, although the symptoms have been improving.   SOCIAL HISTORY: The patient is a nonsmoker, nondrinker.   MEDICATIONS:  1. Synthroid 88 mcg daily.  2. Pioglitazone 30 mg p.o. daily.  3. Metformin 1000 mg p.o. daily.  4. Hydralazine 25 mg p.o. b.i.d.  5. Bisoprolol 1 tablet p.o. daily.  6. Benazepril 20 mg 2 p.o. daily.  7. Aspirin 81  mg p.o. daily.  8. Amlodipine 10 mg p.o. daily.   ALLERGIES: None known.   REVIEW OF SYSTEMS: The patient has had rash with itching over his trunk, swelling of the tongue. Was not having any trouble swallowing except for the brief period where his throat felt tight. He has not had any cough, nausea or vomiting.   PHYSICAL EXAMINATION:  VITAL SIGNS: Temperature is 96.8, pulse 53, blood pressure was 178/64, oxygen concentration is 99%.  GENERAL: A well-developed, well-nourished male. He seems anxious, but he is not stridulous or showing any labored breathing, and he says that he is breathing better currently.  HEAD AND FACE: Head is normocephalic, atraumatic. There are no facial skin lesions.  EARS: External ears, ear canals and tympanic membranes are clear bilaterally.   NASAL: The external nose unremarkable. Nasal cavity is minimally congested with clear secretions. Slight leftward septal deviation. No purulence is seen.  ORAL CAVITY AND OROPHARYNX: Lips did not look particularly swollen. The tongue has some mild edema at this point, but now I can see the posterior pharynx, and apparently this was not possible previously. There is some minimal swelling of the uvula at this point. No exudate is seen. Floor of mouth is unremarkable. Teeth are unremarkable.  NECK: The neck is supple without adenopathy or mass. There is no thyromegaly. There is no swelling of the neck.  RESPIRATORY: Lungs are clear to auscultation without rales, rhonchi or wheezing.  NEUROLOGIC: Cranial nerves II through XII are grossly intact.   DATA REVIEW: White count is  normal at 5.9.   LARYNGOSCOPY: Fiber-optic nasal laryngoscopy was performed. There may be some residual edema of the tongue visible at the level of the tongue base. There is no evidence of infection, and the supraglottic larynx and vocal cords are free of any edema. Vocal cords are clear and mobile.   ASSESSMENT: This patient has had an allergic reaction. This  is less likely angioedema because of the presence of urticaria. The cause of the rash is uncertain, however. It may be prudent to have him stop nonsteroidals until this can be sorted out further. We would be happy to see him in the office for RAST testing to establish whether any of the foods he has eaten recently might have caused this as well as nonsteroidals, which could be assessed. Stopping the lisinopril is somewhat questionable since this was not typical of actual angioedema given the rash, but could be considered as a safety measure. Currently, he has no evidence of need for airway intervention, particularly a surgical airway. The tongue swelling has improved significantly, and there is no swelling of the lower airway. Obviously, he will need to be observed for any rebound effect and reconsultation considered if the airway becomes unstable. Hospital admission to the medicine service might be considered after a period of observation this evening in the Emergency Room. Given the nature of the swelling recently, which was more involving the tongue base, a nasal trumpet and jaw thrust maneuvers could be utilized if the patient's airway becomes difficult again until a proper airway can be established, but fortunately, that is not necessary at this point. Obviously, further management with steroids and epinephrine may be necessary depending on any rebound progression of his condition.     ____________________________ Sammuel Hines. Richardson Landry, MD psb:OSi D: 12/05/2012 23:05:45 ET T: 12/06/2012 05:51:30 ET JOB#: [id number]  cc: Sammuel Hines. Richardson Landry, MD, <Dictator> Riley Nearing MD ELECTRONICALLY SIGNED 12/12/2012 10:56

## 2015-02-20 NOTE — Discharge Summary (Signed)
PATIENT NAME:  Zachary Martinez, Zachary Martinez MR#:  [id number] DATE OF BIRTH:  Dec 07, 1940  DATE OF ADMISSION:  12/06/2012 DATE OF DISCHARGE:  12/06/2012  PRIMARY CARE PHYSICIAN:  Dr. Rosanna Randy.   DISCHARGE DIAGNOSES:  1.  Angioedema.  2.  Allergic reaction.  3.  Uncontrolled hypertension.  4.  Diabetes mellitus.  5.  Obesity.   CONSULTATIONS:  Dr. Clyde Canterbury of ENT.   IMAGING STUDIES DONE:  None.   PROCEDURES:  Flexible fiber-optic nasal laryngoscope.   ADMITTING HISTORY, PHYSICAL AND HOSPITAL COURSE:  Please see detailed H and P dictated by Dr. Tana Coast.  In brief, 74 year old Caucasian male patient with history of diabetes mellitus, hypertension, hypothyroidism on benazepril presented to the Emergency Room with acute swelling of his lips and tongue which started at 8:30 p.m. on 12/05/2012.  The patient had significant swelling of his tongue and some edema in his throat, was admitted to the hospitalist service and CCU for observation for any deterioration.   HOSPITAL COURSE: 1.  Tongue swelling.  The patient was seen by ENT, Clyde Canterbury who did not feel like this is angioedema, although patient was on an ACE inhibitor.  The patient was treated with steroids along with epinephrine, had a flexible nasal laryngoscope that showed some edema in his throat.  The patient's tongue swelling has resolved completely.  No further rash which he had on his arms and is being discharged home with an EpiPen, prednisone for three more days, benazepril stopped and Benadryl as needed.  The patient will follow up with primary care physician, Dr. Rosanna Randy and Dr. Clyde Canterbury of ENT for an RAST allergy test.  2.  Hypertension.  The patient's benazepril has been stopped.  His hydralazine increased from 25 twice daily to 100 3 times a day.  I have advised him to keep a log of his blood pressures to be checked twice a day and follow up with his primary care physician.   Prior to discharge the patient is saturating 97% on room air, blood  pressure 147/61.   DISCHARGE MEDICATIONS: 1.  Amlodipine 10 mg oral once a day.  2.  Aspirin 81 mg oral once a day.  3.  Bisoprolol hydrochlorothiazide 10/6.25 oral once a day.  4.  Synthroid 88 mcg oral once a day.  5.  Pioglitazone 30 mg oral once a day.  6.  Metformin 1000 mg oral once a day.  7.  Hydralazine 100 mg oral 3 times a day.  8.  Prednisone 20 mg oral once a day for three days.  9.  Benadryl 25 mg oral 3 times a day as needed for rash or itching.  10.  EpiPen one dose as needed for severe allergy, throat or tongue swelling.   DISCHARGE INSTRUCTIONS:  The patient was discharged home on a carbohydrate-controlled low-sodium diet.  Activity as tolerated.  Follow up with Dr. Clyde Canterbury of ENT in 1 to 2 weeks and Dr. Rosanna Randy within a week.   TIME SPENT TODAY ON THIS DISCHARGE SUMMARY:  Was 25 minutes.     ____________________________ Leia Alf Kemoni Ortega, MD srs:ea D: 12/06/2012 14:23:00 ET T: 12/07/2012 01:06:53 ET JOB#: [id number]  cc: Alveta Heimlich R. Benjamim Harnish, MD, <Dictator> Sammuel Hines. Richardson Landry, MD Dr. Lindwood Coke MD ELECTRONICALLY SIGNED 12/14/2012 1:01

## 2015-02-20 NOTE — Op Note (Signed)
PATIENT NAME:  Zachary Martinez, Zachary Martinez MR#:  [id number] DATE OF BIRTH:  06-18-1941  DATE OF PROCEDURE:  12/05/2012  PREOPERATIVE DIAGNOSIS: Allergic reaction with throat swelling.   POSTOPERATIVE DIAGNOSIS: Allergic reaction with throat swelling.   PROCEDURE: Flexible fiber-optic nasal laryngoscopy.   SURGEON: Malon Kindle, MD   ANESTHESIA: None.   INDICATIONS: This is a patient with probable allergic reaction with swelling of his tongue and feeling like his throat was tightening. He is actually doing a little better currently, but the airway is being assessed for any risk of progressive involvement.   FINDINGS: The nasal cavity, nasopharynx, hypopharynx, larynx and tongue base were essentially unremarkable. There might have been some residual edema of the base of the tongue from his tongue swelling, but by the time I saw the patient his tongue swelling was improving. There was absolutely no swelling of the supraglottic larynx or vocal cords. Vocal cords were clear and mobile. There was no swelling involving the hypopharynx at all.   DESCRIPTION OF PROCEDURE: After discussing the procedure with the patient, the flexible scope was passed through the right nasal cavity, through the nasopharynx and down to the region of the hypopharynx, larynx and tongue base. These areas were carefully inspected and he was instructed to phonate. Findings are as noted above.  ____________________________ Sammuel Hines. Richardson Landry, MD psb:sb D: 12/05/2012 22:55:00 ET T: 12/06/2012 07:18:48 ET JOB#: [id number]  cc: Sammuel Hines. Richardson Landry, MD, <Dictator> Riley Nearing MD ELECTRONICALLY SIGNED 12/12/2012 10:57

## 2015-02-20 NOTE — H&P (Signed)
PATIENT NAME:  Zachary Martinez, Zachary Martinez MR#:  [id number] DATE OF BIRTH:  1941/10/10  DATE OF ADMISSION:  12/05/2012  CHIEF COMPLAINT:  Swelling of the lips and tongue that started around 8:30 p.m. tonight.   HISTORY OF PRESENT ILLNESS:  The patient is a 74 year old Caucasian male with multiple medical problems including diabetes mellitus, hypertension, hypothyroidism, who is currently on benazepril, presented to the ED with sudden swelling of the lips and the tongue that started around 8:30 p.m. today.  The patient had only a soup and water after returning from the church and started having swelling in the tongue and the lips.  He also developed a rash on his chest which at the time of my encounter had considerably improved.  He also had itching on the chest.  The patient currently has no difficulty swallowing or in his respiratory status.  In the Emergency Room patient was given Solu-Medrol which improved the swelling of the tongue.  He denied any fevers or chills or starting any new medications or eating any new food today.  Hospitalist service was requested for admission for observation.    REVIEW OF SYSTEMS:  CONSTITUTIONAL:  He denies any fever, chills or any loss of appetite or fatigue.  HEENT:  Please see history of present illness.  No blurry vision or any headaches.  The patient has developed tongue swelling and seen in the oropharynx, but no stridor or any dysphonia.  CARDIOVASCULAR:  No chest pain, any shortness of breath or palpitations or any peripheral edema. RESPIRATORY:  The patient denies any wheezing or any shortness of breath, any coughing or productive phlegm. GASTROINTESTINAL:  The patient denies any nausea, vomiting, abdominal pain, any diarrhea, constipation or bleeding in the stools.  MUSCULOSKELETAL:  He has arthritis and has had left leg surgery, otherwise no gait instability.  NEUROLOGIC:  The patient denies any dizziness, lightheadedness or any syncopal episode or any seizures.   HEMATOLOGY:  The patient denies any bleeding disorders or any family history of angioedema.  PAST MEDICAL HISTORY:   1.  Hypertension. 2.  Hypothyroidism. 3.  Diabetes mellitus.  PAST SURGICAL HISTORY:  Left arthroscopic surgery on the left leg.   MEDICATIONS PRIOR TO ADMISSION:  Synthroid 88 mcg daily, pioglitazone 30 mg by mouth daily, metformin 1000 mg daily, hydralazine 25 mg twice daily, bisoprolol hydrochlorothiazide 10 mg/6.25 mg daily, benazepril 20 mg daily, aspirin 81 mg daily.   SOCIAL HISTORY:  The patient is a former smoker, however has quit smoking.  Alcohol denies.  He denies any drug use.  He currently lives at home and is functional with his ADLs.  FAMILY HISTORY:  The patient states that he has a history of colon cancer in the brother and one of his aunts, but no history of familial angioedema in the family.   PHYSICAL EXAMINATION:  VITAL SIGNS:  Pulse 59, respirations 17, blood pressure 163/54, O2 sat is 98%.   GENERAL:  The patient is alert, awake and oriented x 3, not in acute distress.  No dysarthria or dysphonia.  No stridor noted. HEENT:  Anicteric sclerae.  Pink conjunctivae.  Pupils reactive to light and accommodation.  EOMI.  Still significant swelling of the tongue, lip swelling is decreasing per family.  CARDIOVASCULAR:  S1, S2.  Regular rate and rhythm.   CHEST:  Clear to auscultation bilaterally. ABDOMEN:  Soft, nontender, nondistended.  Normal bowel sounds.  EXTREMITIES:  No cyanosis, clubbing or edema noted in the upper and lower extremities bilaterally.   NEUROLOGIC:  No focal neurological deficits noted.  Cranial nerves II-XII intact.  Strength V/V in upper and lower extremities bilaterally.   SKIN:  The rash on the chest, small macular papular rash diffusely over the chest has significantly improved per family.    LABORATORY AND DIAGNOSTIC DATA:  Sodium 143, potassium 4.0, chloride 113, BUN 39, creatinine 1.34, calcium 10.2, alk phos 151, AST 24, ALT  34, WBC is 5.9, hemoglobin 14.5, hematocrit 43.5.  ASSESSMENT AND PLAN:  The patient is a 74 year old male with a history of hypertension, hyperlipidemia and diabetes mellitus on ACE inhibitor for hypertension, presenting with an onset of angioedema with rash on his chest.  Differential diagnosis includes angioedema, likely secondary to ACE inhibitor, however he could have allergic reaction to something else, however he denies any new medications or new food.   1.  Angioedema.  The patient will be admitted for observation to CCU step-down for closer monitoring.  He still has significant tongue swelling, however no dysphonia, dysarthria, dysphagia or any stridor.  The patient is currently maintaining his respiratory airway.  I will start the patient on Decadron scheduled with IV Pepcid and Benadryl as needed.  I will also obtain Complement levels, C1 esterase inhibitor levels.  The patient does not have a history of any familial angioedema.  The patient can be safely discharged tomorrow if he is tolerating diet and the tongue swelling has improved.  2.  Rash on his chest.  Unclear in etiology, possibly allergic reaction.  This has significantly resolved at the time of my encounter, continue Benadryl. 3.  Hypertension.  Hold ACE inhibitor and discontinue.  Continue for now bisoprolol, amlodipine and HCTZ.   4.  Diabetes mellitus.  Continue sliding scale insulin while inpatient.  5.  CODE STATUS:  FULL CODE.  6.  Deep vein thrombosis prophylaxis, SCDs.   TIME SPENT:  One hour.     ____________________________ Estill Cotta, MD rr:ea D: 12/06/2012 00:08:36 ET T: 12/06/2012 00:33:18 ET JOB#: [id number]  cc: Estill Cotta, MD, <Dictator> Audia Amick MD ELECTRONICALLY SIGNED 01/12/2013 20:06

## 2015-02-22 NOTE — Discharge Summary (Signed)
PATIENT NAME:  Zachary Martinez, Zachary Martinez MR#:  [id number] DATE OF BIRTH:  May 12, 1941  DATE OF ADMISSION:  10/20/2011 DATE OF DISCHARGE:  10/22/2011  ADMITTING DIAGNOSIS: Status post ORIF of left tibial plateau fracture with meniscal debridement.   ATTENDING: Hessie Knows, MD, [place]    PROCEDURES: On December 20th, the patient underwent ORIF of left lateral tibial plateau fracture arthroscopically aided. This was by Dr. Hessie Knows with general anesthesia.   ESTIMATED BLOOD LOSS: 150 mL.  TOURNIQUET TIME: 96 minutes.   OPERATIVE FINDINGS: Large depressed fragment was reduced. A small lateral meniscal tear was debrided.  IMPLANTS: Biomet with a lateral tibial locking plate.   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 He was working with one other person carrying a 6 x 6 x 16 foot pressure treated piece of wood. He stepped in a hole and had a valgus injury to his knee. He was brought to [facility] where x-ray was obtained and then Dr. Rudene Christians had talked to the physician at [facility], Dr. Valere Dross, and a CT scan was ordered of the knee for better evaluation of the fracture pattern. He is a generally active male. He does not do a lot of exercising but is physically active. He has a history of non-insulin-dependent diabetes and reports it's well controlled. He has not smoked for many years. He denies any prodromal symptoms. No passing out or other problem with this fall. It is a mechanical injury.   ALLERGIES AND ADVERSE REACTIONS: None.   PAST MEDICAL HISTORY:  1. Chickenpox.  2. Diabetes. 3. Hypertension. 4. Thyroid disease.   PHYSICAL EXAMINATION: HEART: Regular rate and rhythm. LUNGS: Clear to auscultation. LEFT KNEE: Moderate swelling to the knee but no fracture blisters. No significant ecchymosis. He has a valgus deformity that is present that can be passively corrected. Distally he is neurovascularly intact with  moderate edema around the lower calf and foot on the left, worse compared to the right. Sensation is intact. He has palpable pulse at the dorsalis pedis. Skin is intact about the left knee.   X-rays revealed a lateral tibial plateau fracture with very large depressed central lateral tibial plateau injury.   HOSPITAL COURSE: The patient underwent the aforementioned procedure to his left knee by Dr. Rudene Christians on December 20th and was transferred to the PAC-U and then the orthopedic floor in stable condition. Hemoglobin was found to be 11.1 on postop #1. The patient had been treated with left knee immobilizer. He was treated with aspirin and TED hose for DVT prophylaxis. The patient was able to tolerate his diet well. He worked with physical therapy while here and had ambulated 100 feet with a rolling walker. He did have a stable postoperative course. Foley catheter was removed on the 22nd.  His dressing was changed on December 22nd and there was no sign of infection about his knee reported per Zachary Dixon, PA-C.   CONDITION AT DISCHARGE: Stable.   DISPOSITION: Home.  DISCHARGE MEDICATIONS:  1. Percocet 5/325 mg 1 to 2 every four hours as needed for pain.  2. Aspirin 81 mg once a day.   DISCHARGE INSTRUCTIONS AND FOLLOW-UP:  1. He will elevate the left leg as much as possible.  2. He is nonweightbearing on the left leg but may put a little weight on his toes to steady himself.  3. Regular diet with no concentrated sweets or sugar.  3. He may ice  his left knee but will leave his bandage on and keep it clean and dry.  4. He will call our office for any disturbing symptoms.  5. He will follow-up December 24th at 9:15 a.m. with Dr. Hessie Knows of [facility].  6. He will continue his knee immobilizer.  ____________________________ [name], [name] jrp:drc D: 10/24/2011 13:58:15 ET T: 10/26/2011 11:25:07 ET JOB#: [id number]  cc: Jerrel Ivory. Charlett Nose, [name], <Dictator> Jerrel Ivory  Tameko Halder PA ELECTRONICALLY SIGNED 11/01/2011 9:25

## 2015-02-22 NOTE — Op Note (Signed)
PATIENT NAME:  Zachary Martinez, Zachary Martinez MR#:  [id number] DATE OF BIRTH:  1941/02/14  DATE OF PROCEDURE:  04/20/2012  PREOPERATIVE DIAGNOSES: Left knee possible meniscus tear and posttraumatic arthritis.   POSTOPERATIVE DIAGNOSES: Medial and lateral meniscus tears and degenerative arthritis, left knee.   PROCEDURE: Arthroscopy left knee with partial medial and lateral meniscectomy.   ANESTHESIA: General.   SURGEON: Laurene Footman, MD  DESCRIPTION OF PROCEDURE: Patient was brought back to the Operating Room and after adequate anesthesia was obtained the left leg was prepped and draped in the usual sterile fashion with the leg in the arthroscopic legholder with a tourniquet applied. After appropriate patient identification and timeout procedures were completed an inferolateral portal was made and the arthroscope was introduced. There was some loose debris present within the knee as well as some bands of scar tissue which were subsequently debrided with the use of a shaver. There was significant patellofemoral degenerative changes present in the patellofemoral joint without exposed bone. Next, the scope was brought into the medial compartment and an inferomedial portal was made and a probe introduced. Inspection revealed a small parrot beak  type tear at the junction of the middle and posterior thirds as well as significant degenerative changes with no normal appearing articular cartilage in the medial compartment. A meniscal punch and ArthroCare wand were used to address the meniscus tear. The anterior cruciate ligament was intact. Going to the lateral compartment, there was a very large tear of the posterior horn of the lateral meniscus that had actually displaced into the prior fracture site. There was a joint depression injury. Next an arthroscopic shaver was used to debride this large tear as well as a small tear of the anterior horn and lateral meniscus. After addressing this, the gutters were checked and there  were no loose bodies. Some fibrotic tissue superiorly was then debrided. The knee was irrigated until clear and all instrumentation withdrawn pre- and postprocedure pictures having been obtained. The wounds were dressed with. Xeroform, 4 x 4's, Webril, and Ace wrap with wounds closed with simple and interrupted 4-0 nylon and infiltrated with a total of 20 mL of 0.5% Sensorcaine.   CONDITION: To recovery room stable.   ESTIMATED BLOOD LOSS: Minimal.   SPECIMEN: None.  ____________________________ Laurene Footman, MD mjm:cms D: 04/20/2012 25:00:37 ET T: 04/21/2012 10:21:03 ET JOB#: [id number]  cc: Laurene Footman, MD, <Dictator>  Laurene Footman MD ELECTRONICALLY SIGNED 04/22/2012 7:53

## 2015-02-23 DIAGNOSIS — M25511 Pain in right shoulder: Secondary | ICD-10-CM | POA: Diagnosis not present

## 2015-03-01 NOTE — Op Note (Signed)
PATIENT NAME:  Zachary Martinez, Zachary Martinez MR#:  [id number] DATE OF BIRTH:  23-Nov-1940  DATE OF PROCEDURE:  01/01/2015  PREOPERATIVE DIAGNOSES: Rotator cuff tear, right shoulder.   POSTOPERATIVE DIAGNOSES: Rotator cuff and superior labrum anterior and posterior  tears, right shoulder.   PROCEDURES: Arthroscopic superior labrum anterior and posterior repair, arthroscopic debridement, arthroscopic subacromial decompression, mini-open rotator cuff repair, and mini-open biceps tenodesis, right shoulder.   SURGEON:   Pascal Lux, MD   ASSISTANT:  April M. Tretha Sciara, NP   ANESTHESIA: General endotracheal intubation with an interscalene block placed preoperative by the anesthesiologist.   FINDINGS: As noted above. There was degenerative tearing of the superior third of the labrum from approximately the 10 o'clock to 2:30 position. There also was a detachment of the labrum from the 11:30 to the 1:30 position. The biceps tendon had some mild tendinopathic changes, but otherwise was intact. There was a full-thickness tear involving the anterior insertional fibers of the supraspinatus tendon. The remainder of the rotator cuff was in satisfactory condition; grade 1 to 2 chondromalacial changes were noted involving the central glenoid.   COMPLICATIONS: None.   ESTIMATED BLOOD LOSS: Minimal.   TOTAL FLUIDS: Crystalloid 1000 mL.   TOURNIQUET: None.   DRAINS: None.   CLOSURE: Staples.   BRIEF CLINICAL NOTE: The patient is a 74 year old male with a several month history of right shoulder pain; his symptoms have persisted despite medications, activity modification, et Ronney Asters. His history and examination are consistent a rotator cuff tear, confirmed by MRI scan. He presents at this time for definitive management of this injury.   PROCEDURE: The patient was brought into the operating room and lain in the supine position. After adequate IV sedation was achieved, an interscalene block was placed by the anesthesiologist.  The patient then underwent general endotracheal intubation and anesthesia before being repositioned in the beach chair position using the beach chair positioner. The right shoulder and upper extremity were prepped with ChloraPrep solution before being draped sterilely. Preoperative antibiotics were administered. The expected portal sites and incision site were injected with 1% lidocaine with epinephrine before the camera was placed in the posterior portal. The glenohumeral joint was thoroughly inspected with the findings as described above.   An anterior portal was created using an outside-in technique. The labrum was carefully probed, confirming the above noted findings. The frayed portions of the labrum were debrided back to stable margins. The torn edge of the rotator cuff also was debrided. The exposed glenoid rim was roughened with an end-cutting rasp and full radius resector to expose bleeding bone. A separate superolateral portal was created using an outside in technique and the labrum repaired using a single BioKnotless anchor. Of note, the biceps tendon had been released from its superior labrum using the ArthroCare wand prior to repairing the labrum. Subsequent probing of the repair demonstrated excellent stability.  The instruments were removed from the joint after suctioning the excess fluid.   The camera was repositioned through the posterior portal into the subacromial space. A separate lateral portal was created and the shaver introduced to perform a subtotal bursectomy. The ArthroCare wand was introduced to remove the periosteal tissue off the undersurface of the anterior third of the acromion as well as to recess the coracoacromial ligament from its attachment along the anterior and lateral margins of the acromion. The 4 mm acromionizing bur was inserted to complete the decompression by removing the undersurface of the anterior third of the acromion, as well as some osteophytes  laterally. The  full radius resector was reintroduced to remove any residual bony debris before the ArthroCare wand was reinserted to obtain hemostasis. The instruments were then removed from the subacromial space after suctioning the excess fluid.   An approximately 5 to 6 cm incision was made over the anterolateral aspect of the shoulder, incorporating the superolateral portal site. The incision was carried down through the subcutaneous tissues to expose the deltoid fascia. The raphe between the anterior and middle thirds was identified and this plane developed to provide access into the subacromial space. Additional bursal tissues were debrided using Metzenbaum scissors to improve visualization. The torn portion of the rotator cuff was readily identified. A #15 blade was used to freshen the edges further before the exposed greater tuberosity was roughened with a rongeur to provide a good bleeding surface for attachment. Two BioKnotless 2.9 mm JuggerKnot anchors were inserted. Both sets of sutures from the more anterior anchor were placed through the torn margin of the rotator cuff whereas 1 set of the sutures from the more posterior anchor were placed through the anterior flap and the other placed through the posterior flap. Each of these sutures were tied securely to effect the repair. These sutures were then brought back out laterally through bone tunnels and tied over a bony bridge laterally to effect a two-layer closure. An apparent watertight closure was obtained.   The bicipital groove was identified by palpation and opened. The biceps tendon was brought out through this defect. The floor of the bicipital groove was roughened with a curette before a third Biomet 2.9 mm JuggerKnot anchor was inserted. Both sets of sutures were passed through the tendon and tied securely to effect the tenodesis. Two additional #0 Ethibond interrupted sutures were placed to close the bicipital sheath, incorporating the biceps tendon to  further reinforce the tenodesis.   The wound was copiously irrigated with sterile saline solution before the deltoid raphe was reapproximated using 2-0 Vicryl interrupted sutures. The subcutaneous tissues were closed in 2 layers using 2-0 Vicryl interrupted sutures before the skin was closed using staples. The portal sites also were closed using staples. A sterile bulky dressing was applied to the shoulder before the arm was placed into a shoulder immobilizer.  The patient was then awakened, extubated, and returned to the recovery room in satisfactory condition after tolerating the procedure well.    ____________________________ J. Dorien Chihuahua, MD jjp:nt D: 01/01/2015 09:58:53 ET T: 01/01/2015 19:34:10 ET JOB#: [id number]  cc: Pascal Lux, MD, <Dictator> Pascal Lux MD ELECTRONICALLY SIGNED 01/06/2015 14:17

## 2015-03-02 DIAGNOSIS — M25511 Pain in right shoulder: Secondary | ICD-10-CM | POA: Diagnosis not present

## 2015-03-05 DIAGNOSIS — M25511 Pain in right shoulder: Secondary | ICD-10-CM | POA: Diagnosis not present

## 2015-03-09 DIAGNOSIS — M25511 Pain in right shoulder: Secondary | ICD-10-CM | POA: Diagnosis not present

## 2015-03-11 ENCOUNTER — Other Ambulatory Visit: Payer: Self-pay | Admitting: Surgery

## 2015-03-11 DIAGNOSIS — D351 Benign neoplasm of parathyroid gland: Secondary | ICD-10-CM

## 2015-03-12 ENCOUNTER — Other Ambulatory Visit: Payer: Self-pay | Admitting: Surgery

## 2015-03-12 DIAGNOSIS — M25511 Pain in right shoulder: Secondary | ICD-10-CM | POA: Diagnosis not present

## 2015-03-12 DIAGNOSIS — D351 Benign neoplasm of parathyroid gland: Secondary | ICD-10-CM

## 2015-03-16 DIAGNOSIS — M25511 Pain in right shoulder: Secondary | ICD-10-CM | POA: Diagnosis not present

## 2015-03-18 ENCOUNTER — Encounter (HOSPITAL_COMMUNITY)
Admission: RE | Admit: 2015-03-18 | Discharge: 2015-03-18 | Disposition: A | Discharge status: Home / Self Care | Payer: Medicare Other | Source: Ambulatory Visit | Attending: Surgery | Admitting: Surgery

## 2015-03-18 DIAGNOSIS — R799 Abnormal finding of blood chemistry, unspecified: Secondary | ICD-10-CM | POA: Insufficient documentation

## 2015-03-18 MED ORDER — TECHNETIUM TC 99M SESTAMIBI GENERIC - CARDIOLITE
25.0000 | Freq: Once | INTRAVENOUS | Status: AC | PRN
  Administered 2015-03-18: 25 via INTRAVENOUS

## 2015-03-19 DIAGNOSIS — M25511 Pain in right shoulder: Secondary | ICD-10-CM | POA: Diagnosis not present

## 2015-03-23 DIAGNOSIS — M25511 Pain in right shoulder: Secondary | ICD-10-CM | POA: Diagnosis not present

## 2015-03-26 ENCOUNTER — Other Ambulatory Visit: Payer: Self-pay | Admitting: Surgery

## 2015-03-26 DIAGNOSIS — D351 Benign neoplasm of parathyroid gland: Secondary | ICD-10-CM

## 2015-03-26 DIAGNOSIS — M25511 Pain in right shoulder: Secondary | ICD-10-CM | POA: Diagnosis not present

## 2015-03-27 ENCOUNTER — Other Ambulatory Visit: Payer: Self-pay | Admitting: Surgery

## 2015-03-27 DIAGNOSIS — D351 Benign neoplasm of parathyroid gland: Secondary | ICD-10-CM

## 2015-04-02 DIAGNOSIS — M25511 Pain in right shoulder: Secondary | ICD-10-CM | POA: Diagnosis not present

## 2015-04-03 ENCOUNTER — Ambulatory Visit
Admission: RE | Admit: 2015-04-03 | Discharge: 2015-04-03 | Disposition: A | Discharge status: Home / Self Care | Payer: Medicare Other | Source: Ambulatory Visit | Attending: Surgery | Admitting: Surgery

## 2015-04-03 DIAGNOSIS — E213 Hyperparathyroidism, unspecified: Secondary | ICD-10-CM | POA: Diagnosis not present

## 2015-04-19 ENCOUNTER — Other Ambulatory Visit: Payer: Self-pay | Admitting: Family Medicine

## 2015-05-09 DIAGNOSIS — E1142 Type 2 diabetes mellitus with diabetic polyneuropathy: Secondary | ICD-10-CM | POA: Insufficient documentation

## 2015-05-09 DIAGNOSIS — Z800 Family history of malignant neoplasm of digestive organs: Secondary | ICD-10-CM | POA: Insufficient documentation

## 2015-05-09 DIAGNOSIS — E669 Obesity, unspecified: Secondary | ICD-10-CM | POA: Insufficient documentation

## 2015-05-09 DIAGNOSIS — I2510 Atherosclerotic heart disease of native coronary artery without angina pectoris: Secondary | ICD-10-CM | POA: Insufficient documentation

## 2015-05-09 DIAGNOSIS — E785 Hyperlipidemia, unspecified: Secondary | ICD-10-CM | POA: Insufficient documentation

## 2015-05-09 DIAGNOSIS — D351 Benign neoplasm of parathyroid gland: Secondary | ICD-10-CM | POA: Insufficient documentation

## 2015-05-09 DIAGNOSIS — G4730 Sleep apnea, unspecified: Secondary | ICD-10-CM | POA: Insufficient documentation

## 2015-05-09 DIAGNOSIS — M1990 Unspecified osteoarthritis, unspecified site: Secondary | ICD-10-CM | POA: Insufficient documentation

## 2015-05-09 DIAGNOSIS — E039 Hypothyroidism, unspecified: Secondary | ICD-10-CM | POA: Insufficient documentation

## 2015-05-09 DIAGNOSIS — K5730 Diverticulosis of large intestine without perforation or abscess without bleeding: Secondary | ICD-10-CM | POA: Insufficient documentation

## 2015-05-09 DIAGNOSIS — R9389 Abnormal findings on diagnostic imaging of other specified body structures: Secondary | ICD-10-CM | POA: Insufficient documentation

## 2015-05-09 DIAGNOSIS — N529 Male erectile dysfunction, unspecified: Secondary | ICD-10-CM | POA: Insufficient documentation

## 2015-05-09 DIAGNOSIS — N400 Enlarged prostate without lower urinary tract symptoms: Secondary | ICD-10-CM | POA: Insufficient documentation

## 2015-05-21 ENCOUNTER — Other Ambulatory Visit: Payer: Self-pay | Admitting: Family Medicine

## 2015-05-21 MED ORDER — CANAGLIFLOZIN-METFORMIN HCL 150-1000 MG PO TABS: 1.0000 | ORAL_TABLET | Freq: Two times a day (BID) | ORAL | Status: DC

## 2015-05-21 NOTE — Telephone Encounter (Signed)
RX as requested below sent in-aa

## 2015-05-21 NOTE — Telephone Encounter (Signed)
Patient states that he needs five 30 day supply refills of Invokamet [phone] sent to [facility].  He states that he is in the donut hole and needs 30 supply sent to [facility] to last until the end of the year.  The 90 day supply is too expensive.  CB# [phone]

## 2015-05-22 ENCOUNTER — Other Ambulatory Visit: Payer: Self-pay | Admitting: Family Medicine

## 2015-06-03 ENCOUNTER — Other Ambulatory Visit: Payer: Self-pay | Admitting: Family Medicine

## 2015-06-08 ENCOUNTER — Ambulatory Visit (INDEPENDENT_AMBULATORY_CARE_PROVIDER_SITE_OTHER): Payer: Medicare Other | Admitting: Family Medicine

## 2015-06-08 ENCOUNTER — Encounter: Payer: Self-pay | Admitting: Family Medicine

## 2015-06-08 VITALS — BP 136/62 | HR 64 | Temp 98.2°F | Resp 18 | Ht 69.25 in | Wt 248.0 lb

## 2015-06-08 DIAGNOSIS — E669 Obesity, unspecified: Secondary | ICD-10-CM | POA: Diagnosis not present

## 2015-06-08 DIAGNOSIS — E785 Hyperlipidemia, unspecified: Secondary | ICD-10-CM

## 2015-06-08 DIAGNOSIS — I10 Essential (primary) hypertension: Secondary | ICD-10-CM | POA: Diagnosis not present

## 2015-06-08 DIAGNOSIS — D351 Benign neoplasm of parathyroid gland: Secondary | ICD-10-CM | POA: Diagnosis not present

## 2015-06-08 DIAGNOSIS — E1142 Type 2 diabetes mellitus with diabetic polyneuropathy: Secondary | ICD-10-CM

## 2015-06-08 DIAGNOSIS — E038 Other specified hypothyroidism: Secondary | ICD-10-CM

## 2015-06-08 DIAGNOSIS — I2510 Atherosclerotic heart disease of native coronary artery without angina pectoris: Secondary | ICD-10-CM | POA: Diagnosis not present

## 2015-06-08 NOTE — Progress Notes (Signed)
Patient ID: Zachary Martinez, male   DOB: 03/26/1941, 74 y.o.   MRN: [id number]    Subjective:  HPI   Hypertension, follow-up:  BP Readings from Last 3 Encounters:  06/08/15 136/62  02/10/15 148/66  08/14/14 136/71    He was last seen for hypertension 4 months ago.  BP at that visit was 148/66. Management changes since that visit include none. He reports good compliance with treatment. He is not having side effects.  He stays active exercising. He is not adherent to low salt diet.   Outside blood pressures are not been checked. He is experiencing none.   Weight trend: stable Wt Readings from Last 3 Encounters:  06/08/15 248 lb (112.492 kg)  02/10/15 253 lb (114.76 kg)  08/14/14 250 lb (113.399 kg)    Current diet: not really following a specific diet.   Lipid/Cholesterol, Follow-up:   Last seen for this4 months ago.  Management changes since that visit include none. . Last Lipid Panel:    Component Value Date/Time   CHOL 101 06/16/2014   TRIG 221* 06/16/2014   HDL 28* 06/16/2014   LDLCALC 29 06/16/2014    He reports good compliance with treatment. He is not having side effects.  Current symptoms include none and have been stable. Weight trend: stable Prior visit with dietician: yes years ago maybe. Current exercise: stays active not a routine set of exercise.   Wt Readings from Last 3 Encounters:  06/08/15 248 lb (112.492 kg)  02/10/15 253 lb (114.76 kg)  08/14/14 250 lb (113.399 kg)     Hypothyroid, follow-up:  TSH  Date Value Ref Range Status  02/05/2015 2.99 0.41 - 5.90 uIU/mL Final   Wt Readings from Last 3 Encounters:  06/08/15 248 lb (112.492 kg)  02/10/15 253 lb (114.76 kg)  08/14/14 250 lb (113.399 kg)    He was last seen for hypothyroid 4 months ago.  Management changes since that visit include none. He reports good compliance with treatment. He is not having side effects.  He stays active daily exercising. He is experiencing  none Weight trend: stable    Diabetes Mellitus Type II, Follow-up:   Last A1C was 6.6 in April 2016.  Last seen for diabetes 4 months ago.  Management changes included none. He reports good compliance with treatment. He is not having side effects.  Current symptoms include none and have been stable. Home blood sugar records: fasting range: 124-he checked his sugars about 3 times he does not really check his sugars. One time sugar was 140  Episodes of hypoglycemia? no   Current Insulin Regimen: none Weight trend: stable Prior visit with dietician: yes - years ago per patient  Pertinent Labs:    Component Value Date/Time   CHOL 101 06/16/2014   TRIG 221* 06/16/2014   CREATININE 0.7 02/05/2015   CREATININE 1.26 12/06/2012 0426    Wt Readings from Last 3 Encounters:  06/08/15 248 lb (112.492 kg)  02/10/15 253 lb (114.76 kg)  08/14/14 250 lb (113.399 kg)    Patient also states he has seen Dr. Harlow Asa in [place]. Had Nuclear xray, [place] of thyroid, had different labs done, 24 hour urine done (was pretty high per doctor). They can not find out what is going on and pin point the issue. Doctor advised patient he did not want to do exploratory surgery yet. Patient has been put on Vitamin D daily and will re check it in September.         Prior  to Admission medications   Medication Sig Start Date End Date Taking? Authorizing Provider  amLODipine (NORVASC) 10 MG tablet Take by mouth. 02/05/15  Yes Historical Provider, MD  aspirin EC 81 MG tablet Take by mouth.   Yes Historical Provider, MD  atorvastatin (LIPITOR) 40 MG tablet TAKE 1 TABLET EVERY DAY 06/03/15  Yes Jerrol Banana., MD  Canagliflozin-Metformin HCl (INVOKAMET) [phone] MG TABS Take 1 tablet by mouth 2 (two) times daily. 05/21/15  Yes Dawid Dupriest Maceo Pro., MD  carvedilol (COREG) 6.25 MG tablet TAKE 1 TABLET TWICE DAILY 04/19/15  Yes Jerrol Banana., MD  Cholecalciferol (VITAMIN D3) 1000 UNITS CAPS Take 1  capsule by mouth daily.   Yes Historical Provider, MD  EPINEPHRINE, ANAPHYLAXIS THERAPY AGENTS, EPIPEN 2-PAK, 0.'3MG'$ /0.3ML (Injection Device) - Historical Medication  as directed (0.3 MG/0.3ML) Active   Yes Historical Provider, MD  hydrALAZINE (APRESOLINE) 100 MG tablet Take by mouth. 02/05/15  Yes Historical Provider, MD  levothyroxine (SYNTHROID, LEVOTHROID) 88 MCG tablet Take by mouth. 02/05/15  Yes Historical Provider, MD  pioglitazone (ACTOS) 30 MG tablet TAKE 1 TABLET EVERY DAY 05/24/15  Yes Jerrol Banana., MD  sildenafil (VIAGRA) 100 MG tablet Take by mouth. 06/16/14  Yes Historical Provider, MD    Patient Active Problem List   Diagnosis Date Noted  . Abnormal chest x-ray 05/09/2015  . CAD in native artery 05/09/2015  . Benign fibroma of prostate 05/09/2015  . Arteriosclerosis of coronary artery 05/09/2015  . Diabetes mellitus with polyneuropathy 05/09/2015  . Diverticulosis of colon 05/09/2015  . ED (erectile dysfunction) of organic origin 05/09/2015  . Family history of colon cancer 05/09/2015  . HLD (hyperlipidemia) 05/09/2015  . Adult hypothyroidism 05/09/2015  . Adiposity 05/09/2015  . Arthritis, degenerative 05/09/2015  . Parathyroid adenoma 05/09/2015  . Apnea, sleep 05/09/2015  . Disorder of bursae and tendons in shoulder region 12/22/2014  . Hypertensive left ventricular hypertrophy 11/24/2014  . MI (mitral incompetence) 11/24/2014  . Obstructive apnea 11/24/2014    Past Medical History  Diagnosis Date  . Diabetes   . CAD (coronary artery disease)   . Hypertension   . Hyperlipidemia   . Sleep apnea     cpap  . Hypothyroidism     History   Social History  . Marital Status: Married    Spouse Name: N/A  . Number of Children: N/A  . Years of Education: N/A   Occupational History  . Not on file.   Social History Main Topics  . Smoking status: Former Smoker    Quit date: 07/04/1979  . Smokeless tobacco: Former Systems developer    Quit date: 11/01/1979  .  Alcohol Use: No  . Drug Use: No  . Sexual Activity: Not on file   Other Topics Concern  . Not on file   Social History Narrative    Allergies  Allergen Reactions  . Ace Inhibitors Anaphylaxis    Review of Systems  Constitutional: Positive for malaise/fatigue. Negative for fever and chills.  Respiratory: Negative for cough, sputum production, shortness of breath and wheezing.   Cardiovascular: Positive for leg swelling (concerned with both lower legs swelling ,redness present there also, had a blister come up recently on the lower left leg. No pain or heat in that area.). Negative for chest pain, palpitations, orthopnea and claudication.  Gastrointestinal: Negative for heartburn, nausea, abdominal pain and diarrhea.  Musculoskeletal: Positive for joint pain (knees chronic). Negative for myalgias, back pain and neck pain.  Neurological: Positive for  weakness (at times). Negative for dizziness, tingling, tremors and headaches.    Immunization History  Administered Date(s) Administered  . Pneumococcal Conjugate-13 06/16/2014  . Pneumococcal Polysaccharide-23 06/18/2012  . Td 11/21/2003, 01/24/2015  . Zoster 06/18/2012   Objective:  BP 136/62 mmHg  Pulse 64  Temp(Src) 98.2 F (36.8 C)  Resp 18  Ht 5' 9.25" (1.759 m)  Wt 248 lb (112.492 kg)  BMI 36.36 kg/m2  Physical Exam  Constitutional: He is oriented to person, place, and time and well-developed, well-nourished, and in no distress.  HENT:  Head: Normocephalic and atraumatic.  Eyes: Conjunctivae are normal. Pupils are equal, round, and reactive to light.  Neck: Normal range of motion. Neck supple.  Cardiovascular: Normal rate, regular rhythm, normal heart sounds and intact distal pulses.   Pulmonary/Chest: Effort normal and breath sounds normal. No respiratory distress. He has no wheezes. He has no rales.  Musculoskeletal: He exhibits edema (1+ right and 2+ on the left lower leg). He exhibits no tenderness.   Neurological: He is alert and oriented to person, place, and time.  Skin: There is erythema.  Psychiatric: Memory, affect and judgment normal.    Lab Results  Component Value Date   WBC 5.2 02/05/2015   HGB 13.8 02/05/2015   HCT 42 02/05/2015   PLT 208 02/05/2015   GLUCOSE 327* 12/06/2012   CHOL 101 06/16/2014   TRIG 221* 06/16/2014   HDL 28* 06/16/2014   LDLCALC 29 06/16/2014   TSH 2.99 02/05/2015   PSA 2.9 07/08/2014    CMP     Component Value Date/Time   NA 143 02/05/2015   NA 142 12/06/2012 0426   K 4.4 02/05/2015   K 4.3 12/06/2012 0426   CL 114* 12/06/2012 0426   CO2 20* 12/06/2012 0426   GLUCOSE 327* 12/06/2012 0426   BUN 18 02/05/2015   BUN 35* 12/06/2012 0426   CREATININE 0.7 02/05/2015   CREATININE 1.26 12/06/2012 0426   CALCIUM 10.1 12/06/2012 0426   PROT 7.3 12/05/2012 2208   ALBUMIN 3.8 12/05/2012 2208   AST 15 02/05/2015   AST 24 12/05/2012 2208   ALT 16 02/05/2015   ALT 34 12/05/2012 2208   ALKPHOS 104 02/05/2015   ALKPHOS 151* 12/05/2012 2208   BILITOT 0.2 12/05/2012 2208   GFRNONAA 57* 12/06/2012 0426   GFRAA >60 12/06/2012 0426    Assessment and Plan :  1. Other specified hypothyroidism Stable on the last labs.  2. Diabetic polyneuropathy associated with type 2 diabetes mellitus Unable to check A1C in the office today due to test needing controls. Sent patient to [facility]. Pending results. - HgB A1c  3. Adiposity Work on diet and exercise. - Lipid Panel With LDL/HDL Ratio  4. HLD (hyperlipidemia) - Lipid Panel With LDL/HDL Ratio  5. CAD in native artery  6. Essential hypertension Stable. Follow.  7. Parathyroid adenoma I though the surgeon diagnosed patient with this nad is following him at this time. Patient states he was never told 100% what was going. Continue to follow up with endocrinologist as scheduled.  8. Edema Probably from Amlodipine. Advised patient may need to adjust medications. Will follow for  now.      Patient was seen and examined by Dr. Eulas Post and note was scribed by Theressa Millard, RMA.     Miguel Aschoff MD [facility] 06/08/2015 11:08 AM

## 2015-06-10 ENCOUNTER — Other Ambulatory Visit: Payer: Self-pay | Admitting: Family Medicine

## 2015-06-10 DIAGNOSIS — E1142 Type 2 diabetes mellitus with diabetic polyneuropathy: Secondary | ICD-10-CM | POA: Diagnosis not present

## 2015-06-10 DIAGNOSIS — E785 Hyperlipidemia, unspecified: Secondary | ICD-10-CM | POA: Diagnosis not present

## 2015-06-10 DIAGNOSIS — E669 Obesity, unspecified: Secondary | ICD-10-CM | POA: Diagnosis not present

## 2015-06-11 LAB — LIPID PANEL WITH LDL/HDL RATIO
Cholesterol, Total: 112 mg/dL (ref 100–199)
HDL: 27 mg/dL — ABNORMAL LOW (ref >39)
LDL CALC: 47 mg/dL (ref 0–99)
LDl/HDL Ratio: 1.7 ratio units (ref 0.0–3.6)
Triglycerides: 190 mg/dL — ABNORMAL HIGH (ref 0–149)
VLDL Cholesterol Cal: 38 mg/dL (ref 5–40)

## 2015-06-11 LAB — HEMOGLOBIN A1C
ESTIMATED AVERAGE GLUCOSE: 146 mg/dL
Hgb A1c MFr Bld: 6.7 % — ABNORMAL HIGH (ref 4.8–5.6)

## 2015-08-01 DEATH — deceased

## 2015-08-17 ENCOUNTER — Other Ambulatory Visit: Payer: Self-pay | Admitting: Surgery

## 2015-08-21 ENCOUNTER — Ambulatory Visit
Admission: RE | Admit: 2015-08-21 | Discharge: 2015-08-21 | Disposition: A | Discharge status: Home / Self Care | Payer: Medicare Other | Source: Ambulatory Visit | Attending: Surgery | Admitting: Surgery

## 2015-08-21 DIAGNOSIS — E049 Nontoxic goiter, unspecified: Secondary | ICD-10-CM | POA: Diagnosis not present

## 2015-09-07 DIAGNOSIS — Z23 Encounter for immunization: Secondary | ICD-10-CM | POA: Diagnosis not present

## 2015-10-08 ENCOUNTER — Encounter: Payer: Self-pay | Admitting: Family Medicine

## 2015-10-08 ENCOUNTER — Ambulatory Visit (INDEPENDENT_AMBULATORY_CARE_PROVIDER_SITE_OTHER): Payer: Medicare Other | Admitting: Family Medicine

## 2015-10-08 VITALS — BP 144/58 | HR 64 | Temp 98.8°F | Resp 16 | Ht 69.0 in | Wt 249.0 lb

## 2015-10-08 DIAGNOSIS — Z0000 Encounter for general adult medical examination without abnormal findings: Secondary | ICD-10-CM | POA: Diagnosis not present

## 2015-10-08 DIAGNOSIS — Z79899 Other long term (current) drug therapy: Secondary | ICD-10-CM | POA: Diagnosis not present

## 2015-10-08 DIAGNOSIS — N400 Enlarged prostate without lower urinary tract symptoms: Secondary | ICD-10-CM | POA: Diagnosis not present

## 2015-10-08 DIAGNOSIS — I2510 Atherosclerotic heart disease of native coronary artery without angina pectoris: Secondary | ICD-10-CM | POA: Diagnosis not present

## 2015-10-08 DIAGNOSIS — E1142 Type 2 diabetes mellitus with diabetic polyneuropathy: Secondary | ICD-10-CM | POA: Diagnosis not present

## 2015-10-08 DIAGNOSIS — E785 Hyperlipidemia, unspecified: Secondary | ICD-10-CM | POA: Diagnosis not present

## 2015-10-08 DIAGNOSIS — E119 Type 2 diabetes mellitus without complications: Secondary | ICD-10-CM | POA: Insufficient documentation

## 2015-10-08 DIAGNOSIS — E213 Hyperparathyroidism, unspecified: Secondary | ICD-10-CM | POA: Diagnosis not present

## 2015-10-08 DIAGNOSIS — Z1211 Encounter for screening for malignant neoplasm of colon: Secondary | ICD-10-CM | POA: Diagnosis not present

## 2015-10-08 LAB — POCT URINALYSIS DIPSTICK
Bilirubin, UA: NEGATIVE
Glucose, UA: 1000
KETONES UA: NEGATIVE
Leukocytes, UA: NEGATIVE
Nitrite, UA: NEGATIVE
PH UA: 6
PROTEIN UA: NEGATIVE
RBC UA: NEGATIVE
Spec Grav, UA: 1.02
UROBILINOGEN UA: NEGATIVE

## 2015-10-08 LAB — GLUCOSE, POCT (MANUAL RESULT ENTRY): POC GLUCOSE: 131 mg/dL — AB (ref 70–99)

## 2015-10-08 LAB — POCT GLYCOSYLATED HEMOGLOBIN (HGB A1C): Hemoglobin A1C: 6.6

## 2015-10-08 LAB — IFOBT (OCCULT BLOOD): IFOBT: NEGATIVE

## 2015-10-08 MED ORDER — CANAGLIFLOZIN-METFORMIN HCL 150-1000 MG PO TABS: 1.0000 | ORAL_TABLET | Freq: Two times a day (BID) | ORAL | Status: DC

## 2015-10-08 NOTE — Progress Notes (Signed)
Patient ID: Zachary Martinez, male   DOB: 1941-03-20, 74 y.o.   MRN: [id number]  Visit Date: 10/08/2015  Today's Provider: [name], MD   Chief Complaint  Patient presents with  . Medicare Wellness   Subjective:   Zachary Martinez is a 74 y.o. male who presents today for his Subsequent Annual Wellness Visit. He feels fairly well. He reports exercising very little. He reports he is sleeping well. Immunization History  Administered Date(s) Administered  . Influenza-Unspecified 09/07/2015  . Pneumococcal Conjugate-13 06/16/2014  . Pneumococcal Polysaccharide-23 06/18/2012  . Td 11/21/2003, 01/24/2015  . Zoster 06/18/2012   LAST Colonoscopy 06/14/14 diverticulosis, polyps, internal hemorrhoids, repeat 5 years. Patient has chronic BPH symptoms with hesitancy and decreased urinary stream. He has nocturia 1-2. He needs lab work on his hyperparathyroid disease. He also needs follow-up lab work on his type 2 diabetes and associated lipids  Review of Systems  Constitutional: Positive for fatigue.  HENT: Negative.   Eyes: Negative.   Respiratory: Negative.   Cardiovascular: Positive for leg swelling.  Gastrointestinal: Negative.   Endocrine: Positive for polyuria.  Genitourinary: Positive for enuresis.  Musculoskeletal: Positive for arthralgias.  Skin: Negative.   Allergic/Immunologic: Negative.   Neurological: Negative.   Hematological: Bruises/bleeds easily.  Psychiatric/Behavioral: Negative.     Patient Active Problem List   Diagnosis Date Noted  . Abnormal chest x-ray 05/09/2015  . CAD in native artery 05/09/2015  . Benign fibroma of prostate 05/09/2015  . Arteriosclerosis of coronary artery 05/09/2015  . Diabetes mellitus with polyneuropathy ([place]) 05/09/2015  . Diverticulosis of colon 05/09/2015  . ED (erectile dysfunction) of organic origin 05/09/2015  . Family history of colon cancer 05/09/2015  . HLD (hyperlipidemia) 05/09/2015  . Adult hypothyroidism 05/09/2015  .  Adiposity 05/09/2015  . Arthritis, degenerative 05/09/2015  . Parathyroid adenoma 05/09/2015  . Apnea, sleep 05/09/2015  . Disorder of bursae and tendons in shoulder region 12/22/2014  . Hypertensive left ventricular hypertrophy 11/24/2014  . MI (mitral incompetence) 11/24/2014  . Obstructive apnea 11/24/2014    Social History   Social History  . Marital Status: Married    Spouse Name: N/A  . Number of Children: N/A  . Years of Education: N/A   Occupational History  . Not on file.   Social History Main Topics  . Smoking status: Former Smoker    Quit date: 07/04/1979  . Smokeless tobacco: Former Systems developer    Quit date: 11/01/1979  . Alcohol Use: No  . Drug Use: No  . Sexual Activity: Not on file   Other Topics Concern  . Not on file   Social History Narrative    Past Surgical History  Procedure Laterality Date  . Tonsillectomy and adenoidectomy    . Knee arthroscopy Right 2003, 2005    x2  . Knee arthroscopy Left 2013  . Uvuloplasty  2000  . Cyst removal neck    . Nasal polyps  1978  . Tibia fracture surgery Left 2012    His family history includes Anemia in his mother; Colon cancer (age of onset: 62) in his brother; Colon cancer (age of onset: 34) in his maternal aunt and maternal aunt; Diabetes in his brother; Hyperlipidemia in his brother; Hypertension in his brother; Kidney disease in his mother.    Outpatient Prescriptions Prior to Visit  Medication Sig Dispense Refill  . amLODipine (NORVASC) 10 MG tablet Take by mouth.    Marland Kitchen aspirin EC 81 MG tablet Take by mouth.    Marland Kitchen  atorvastatin (LIPITOR) 40 MG tablet TAKE 1 TABLET EVERY DAY 90 tablet 3  . carvedilol (COREG) 6.25 MG tablet TAKE 1 TABLET TWICE DAILY 180 tablet 4  . Cholecalciferol (VITAMIN D3) 1000 UNITS CAPS Take 1 capsule by mouth daily.    Marland Kitchen EPINEPHRINE, ANAPHYLAXIS THERAPY AGENTS, EPIPEN 2-PAK, 0.3MG/0.3ML (Injection Device) - Historical Medication  as directed (0.3 MG/0.3ML) Active    . hydrALAZINE  (APRESOLINE) 100 MG tablet Take by mouth.    . levothyroxine (SYNTHROID, LEVOTHROID) 88 MCG tablet Take by mouth.    . pioglitazone (ACTOS) 30 MG tablet TAKE 1 TABLET EVERY DAY 90 tablet 3  . sildenafil (VIAGRA) 100 MG tablet Take by mouth.    . Canagliflozin-Metformin HCl (INVOKAMET) [phone] MG TABS Take 1 tablet by mouth 2 (two) times daily. 60 tablet 5   No facility-administered medications prior to visit.    Allergies  Allergen Reactions  . Ace Inhibitors Anaphylaxis    Patient Care Team: Jerrol Banana., MD as PCP - General (Family Medicine)  Objective:   Vitals:  Filed Vitals:   10/08/15 1017  BP: 144/58  Pulse: 64  Temp: 98.8 F (37.1 C)  Resp: 16  Height: 5' 9"  (1.753 m)  Weight: 249 lb (112.946 kg)    Physical Exam  Constitutional: He is oriented to person, place, and time. He appears well-developed and well-nourished.  HENT:  Head: Normocephalic and atraumatic.  Right Ear: External ear normal.  Left Ear: External ear normal.  Nose: Nose normal.  Mouth/Throat: Oropharynx is clear and moist.  Eyes: Conjunctivae are normal.  Neck: Neck supple.  Cardiovascular: Normal rate, regular rhythm, normal heart sounds and intact distal pulses.   Pulmonary/Chest: Effort normal and breath sounds normal.  Abdominal: Soft.  Genitourinary: Rectum normal, prostate normal and penis normal. Guaiac negative stool.  Neurological: He is alert and oriented to person, place, and time.  Skin: Skin is warm and dry.  Psychiatric: He has a normal mood and affect. His behavior is normal. Judgment and thought content normal.    Activities of Daily Living In your present state of health, do you have any difficulty performing the following activities: 10/08/2015 06/08/2015  Hearing? N N  Vision? N N  Difficulty concentrating or making decisions? N N  Walking or climbing stairs? N N  Dressing or bathing? N N  Doing errands, shopping? N N    Fall Risk Assessment Fall Risk   10/08/2015 06/08/2015  Falls in the past year? No No     Depression Screen PHQ 2/9 Scores 10/08/2015 06/08/2015  PHQ - 2 Score 0 0    Cognitive Testing - 6-CIT    Year: 0 4 points  Month: 0 3 points  Memorize "Pia Mau, [address], [place]"  Time (within 1 hour:) 0 3 points  Count backwards from 20: 0 2 4 points  Name months of year: 0 2 4 points  Repeat Address: 0 2 4 6 8 10  points   Total Score: 0/28  Interpretation : Normal (0-7) Abnormal (8-28)    Assessment & Plan:     Annual Wellness Visit  Reviewed patient's Family Medical History Reviewed and updated list of patient's medical providers Assessment of cognitive impairment was done Assessed patient's functional ability Established a written schedule for health screening [employer] Completed and Reviewed   Hyperparathyroidism Type 2 diabetes Obesity Osteoarthritis BPH Hyperlipidemia As time obtain PSA, TSH, met C, lipid panel, A1c.    Miguel Aschoff MD [facility]  [facility] 10/08/2015 10:18 AM  ------------------------------------------------------------------------------------------------------------

## 2016-01-11 ENCOUNTER — Other Ambulatory Visit: Payer: Self-pay | Admitting: Family Medicine

## 2016-01-13 ENCOUNTER — Ambulatory Visit (INDEPENDENT_AMBULATORY_CARE_PROVIDER_SITE_OTHER): Payer: Medicare Other | Admitting: Family Medicine

## 2016-01-13 ENCOUNTER — Encounter: Payer: Self-pay | Admitting: Family Medicine

## 2016-01-13 VITALS — BP 152/78 | HR 60 | Temp 98.5°F | Resp 14 | Wt 254.2 lb

## 2016-01-13 DIAGNOSIS — I889 Nonspecific lymphadenitis, unspecified: Secondary | ICD-10-CM

## 2016-01-13 MED ORDER — AMOXICILLIN-POT CLAVULANATE 875-125 MG PO TABS: 1.0000 | ORAL_TABLET | Freq: Two times a day (BID) | ORAL | Status: DC

## 2016-01-13 NOTE — Progress Notes (Signed)
Patient ID: Zachary Martinez, male   DOB: Jun 12, 1941, 75 y.o.   MRN: [id number]   Patient: Zachary Martinez Male    DOB: Nov 07, 1940   75 y.o.   MRN: [id number] Visit Date: 01/13/2016  Today's Provider: [name], MD   Chief Complaint  Patient presents with  . URI   Subjective:    URI  This is a new problem. The current episode started in the past 7 days (3 days ago). The problem has been unchanged. There has been no fever. Associated symptoms include congestion, coughing and a sore throat. Treatments tried: sudafed and throat spray. The treatment provided no relief.      Previous Medications   AMLODIPINE (NORVASC) 10 MG TABLET    TAKE 1 TABLET EVERY DAY   ASPIRIN EC 81 MG TABLET    Take by mouth.   ATORVASTATIN (LIPITOR) 40 MG TABLET    TAKE 1 TABLET EVERY DAY   CANAGLIFLOZIN-METFORMIN HCL (INVOKAMET) [phone] MG TABS    Take 1 tablet by mouth 2 (two) times daily.   CARVEDILOL (COREG) 6.25 MG TABLET    TAKE 1 TABLET TWICE DAILY   CHOLECALCIFEROL (VITAMIN D3) 1000 UNITS CAPS    Take 1 capsule by mouth daily.   EPINEPHRINE, ANAPHYLAXIS THERAPY AGENTS,    EPIPEN 2-PAK, 0.'3MG'$ /0.3ML (Injection Device) - Historical Medication  as directed (0.3 MG/0.3ML) Active   HYDRALAZINE (APRESOLINE) 100 MG TABLET    Take by mouth.   LEVOTHYROXINE (SYNTHROID, LEVOTHROID) 88 MCG TABLET    TAKE 1 TABLET EVERY DAY   PIOGLITAZONE (ACTOS) 30 MG TABLET    TAKE 1 TABLET EVERY DAY   SILDENAFIL (VIAGRA) 100 MG TABLET    Take by mouth.   Allergies  Allergen Reactions  . Ace Inhibitors Anaphylaxis    Review of Systems  Constitutional: Negative.   HENT: Positive for congestion, postnasal drip and sore throat.   Eyes: Negative.   Respiratory: Positive for cough.   Cardiovascular: Negative.   Gastrointestinal: Negative.   Endocrine: Negative.   Musculoskeletal: Negative.   Skin: Negative.   Allergic/Immunologic: Negative.   Neurological: Negative.   Hematological: Negative.   Psychiatric/Behavioral:  Negative.     Social History  Substance Use Topics  . Smoking status: Former Smoker    Quit date: 07/04/1979  . Smokeless tobacco: Former Systems developer    Quit date: 11/01/1979  . Alcohol Use: No   Objective:   BP 152/78 mmHg  Pulse 60  Temp(Src) 98.5 F (36.9 C) (Oral)  Resp 14  Wt 254 lb 3.2 oz (115.304 kg)  Physical Exam  Constitutional: He is oriented to person, place, and time. He appears well-developed and well-nourished.  Obese white male in no acute distress.  HENT:  Head: Normocephalic and atraumatic.  Right Ear: External ear normal.  Left Ear: External ear normal.  Nose: Nose normal.  Eyes: Conjunctivae are normal.  Neck: Neck supple.  Cardiovascular: Normal rate, regular rhythm and normal heart sounds.   Pulmonary/Chest: Effort normal and breath sounds normal.  Abdominal: Soft.  Neurological: He is alert and oriented to person, place, and time.  Skin: Skin is warm and dry.  Psychiatric: He has a normal mood and affect. His behavior is normal. Judgment and thought content normal.  mildly tender and swollen anterior cervical lymph nodes      Assessment & Plan:     1. Lymphadenitis  - amoxicillin-clavulanate (AUGMENTIN) 875-125 MG tablet; Take 1 tablet by mouth 2 (two) times daily.  Dispense: 14 tablet;  Refill: 0 2. URI Fluids rest and symptomatic treatment. I have done the exam and reviewed the above chart and it is accurate to the best of my knowledge.   Follow up: No Follow-up on file.

## 2016-01-18 DIAGNOSIS — E119 Type 2 diabetes mellitus without complications: Secondary | ICD-10-CM | POA: Diagnosis not present

## 2016-01-18 LAB — HM DIABETES EYE EXAM

## 2016-02-01 ENCOUNTER — Ambulatory Visit: Payer: Medicare Other | Admitting: Family Medicine

## 2016-02-02 ENCOUNTER — Ambulatory Visit (INDEPENDENT_AMBULATORY_CARE_PROVIDER_SITE_OTHER): Payer: Medicare Other | Admitting: Family Medicine

## 2016-02-02 ENCOUNTER — Encounter: Payer: Self-pay | Admitting: Family Medicine

## 2016-02-02 VITALS — BP 142/60 | HR 52 | Temp 97.9°F | Resp 16 | Wt 255.0 lb

## 2016-02-02 DIAGNOSIS — I10 Essential (primary) hypertension: Secondary | ICD-10-CM

## 2016-02-02 DIAGNOSIS — E1142 Type 2 diabetes mellitus with diabetic polyneuropathy: Secondary | ICD-10-CM

## 2016-02-02 DIAGNOSIS — D1720 Benign lipomatous neoplasm of skin and subcutaneous tissue of unspecified limb: Secondary | ICD-10-CM | POA: Diagnosis not present

## 2016-02-02 LAB — POCT GLYCOSYLATED HEMOGLOBIN (HGB A1C): Hemoglobin A1C: 6.7

## 2016-02-02 NOTE — Progress Notes (Signed)
Patient ID: Zachary Martinez, male   DOB: [birth year], 75 y.o.   MRN: [id number]    Subjective:  HPI  Diabetes Mellitus Type II, Follow-up:   Lab Results  Component Value Date   HGBA1C 6.6 10/08/2015   HGBA1C 6.7* 06/10/2015    Last seen for diabetes 4 months ago.  Management since then includes none. He reports good compliance with treatment. He is not having side effects.  Current symptoms include none and have been unchanged. Home blood sugar records: not being checked  Episodes of hypoglycemia? no   Current Insulin Regimen: n/a Most Recent Eye Exam: about a month ago. Current exercise: gardening and yard work  Pertinent Labs:    Component Value Date/Time   CHOL 112 06/10/2015 0839   CHOL 101 06/16/2014   TRIG 190* 06/10/2015 0839   HDL 27* 06/10/2015 0839   HDL 28* 06/16/2014   LDLCALC 47 06/10/2015 0839   LDLCALC 29 06/16/2014   CREATININE 0.7 02/05/2015   CREATININE 1.26 12/06/2012 0426    Wt Readings from Last 3 Encounters:  02/02/16 255 lb (115.667 kg)  01/13/16 254 lb 3.2 oz (115.304 kg)  10/08/15 249 lb (112.946 kg)    ------------------------------------------------------------------------    Hypertension, follow-up:  BP Readings from Last 3 Encounters:  02/02/16 142/60  01/13/16 152/78  10/08/15 144/58    He was last seen for hypertension 4 months ago.  BP at that visit was 152/78. Management since that visit includes none. He reports good compliance with treatment. He is not having side effects.  He is not exercising. He is adherent to low salt diet.   Outside blood pressures are not being checked. He is experiencing none.  Patient denies chest pain, chest pressure/discomfort, claudication, dyspnea, exertional chest pressure/discomfort, fatigue, irregular heart beat, lower extremity edema, near-syncope, orthopnea, palpitations and paroxysmal nocturnal dyspnea.   Cardiovascular risk factors include advanced age (older than 88 for men, 64 for  women), diabetes mellitus, dyslipidemia, hypertension, male gender, obesity (BMI >= 30 kg/m2) and sedentary lifestyle.   Wt Readings from Last 3 Encounters:  02/02/16 255 lb (115.667 kg)  01/13/16 254 lb 3.2 oz (115.304 kg)  10/08/15 249 lb (112.946 kg)   ------------------------------------------------------------------------  He has lumps under his left upper arm. He reports that they have been there for a while and the affected arm is started hurting. He said that he was told in the past that it was fatty tumor.    Prior to Admission medications   Medication Sig Start Date End Date Taking? Authorizing Provider  amLODipine (NORVASC) 10 MG tablet TAKE 1 TABLET EVERY DAY 01/11/16  Yes Jerrol Banana., MD  aspirin EC 81 MG tablet Take by mouth.   Yes Historical Provider, MD  atorvastatin (LIPITOR) 40 MG tablet TAKE 1 TABLET EVERY DAY 06/03/15  Yes Jerrol Banana., MD  Canagliflozin-Metformin HCl (INVOKAMET) [phone] MG TABS Take 1 tablet by mouth 2 (two) times daily. 10/08/15  Yes Richard Maceo Pro., MD  carvedilol (COREG) 6.25 MG tablet TAKE 1 TABLET TWICE DAILY 04/19/15  Yes Jerrol Banana., MD  Cholecalciferol (VITAMIN D3) 1000 UNITS CAPS Take 1 capsule by mouth daily.   Yes Historical Provider, MD  EPINEPHRINE, ANAPHYLAXIS THERAPY AGENTS, EPIPEN 2-PAK, 0.'3MG'$ /0.3ML (Injection Device) - Historical Medication  as directed (0.3 MG/0.3ML) Active   Yes Historical Provider, MD  hydrALAZINE (APRESOLINE) 100 MG tablet Take by mouth. 02/05/15  Yes Historical Provider, MD  levothyroxine (SYNTHROID, LEVOTHROID) 88 MCG tablet TAKE 1  TABLET EVERY DAY 01/11/16  Yes Jerrol Banana., MD  pioglitazone (ACTOS) 30 MG tablet TAKE 1 TABLET EVERY DAY 05/24/15  Yes Jerrol Banana., MD  sildenafil (VIAGRA) 100 MG tablet Take by mouth. 06/16/14  Yes Historical Provider, MD    Patient Active Problem List   Diagnosis Date Noted  . Type 2 diabetes mellitus ([place]) 10/08/2015  . Abnormal  chest x-ray 05/09/2015  . CAD in native artery 05/09/2015  . Benign fibroma of prostate 05/09/2015  . Arteriosclerosis of coronary artery 05/09/2015  . Diabetes mellitus with polyneuropathy ([place]) 05/09/2015  . Diverticulosis of colon 05/09/2015  . ED (erectile dysfunction) of organic origin 05/09/2015  . Family history of colon cancer 05/09/2015  . HLD (hyperlipidemia) 05/09/2015  . Adult hypothyroidism 05/09/2015  . Adiposity 05/09/2015  . Arthritis, degenerative 05/09/2015  . Parathyroid adenoma 05/09/2015  . Apnea, sleep 05/09/2015  . Disorder of bursae and tendons in shoulder region 12/22/2014  . Hypertensive left ventricular hypertrophy 11/24/2014  . MI (mitral incompetence) 11/24/2014  . Obstructive apnea 11/24/2014    Past Medical History  Diagnosis Date  . Diabetes ([place])   . CAD (coronary artery disease)   . Hypertension   . Hyperlipidemia   . Sleep apnea     cpap  . Hypothyroidism     Social History   Social History  . Marital Status: Married    Spouse Name: N/A  . Number of Children: N/A  . Years of Education: N/A   Occupational History  . Not on file.   Social History Main Topics  . Smoking status: Former Smoker    Quit date: 07/04/1979  . Smokeless tobacco: Former Systems developer    Quit date: 11/01/1979  . Alcohol Use: No  . Drug Use: No  . Sexual Activity: Not on file   Other Topics Concern  . Not on file   Social History Narrative    Allergies  Allergen Reactions  . Ace Inhibitors Anaphylaxis    Review of Systems  Constitutional: Negative.   HENT: Negative.   Eyes: Negative.   Respiratory: Negative.   Cardiovascular: Negative.   Gastrointestinal: Negative.   Genitourinary: Negative.   Musculoskeletal: Negative.   Skin: Negative.        Lumps under left upper arm.  Neurological: Negative.   Endo/Heme/Allergies: Negative.   Psychiatric/Behavioral: Negative.     Immunization History  Administered Date(s) Administered  .  Influenza-Unspecified 09/07/2015  . Pneumococcal Conjugate-13 06/16/2014  . Pneumococcal Polysaccharide-23 06/18/2012  . Td 11/21/2003, 01/24/2015  . Zoster 06/18/2012   Objective:  BP 142/60 mmHg  Pulse 52  Temp(Src) 97.9 F (36.6 C) (Oral)  Resp 16  Wt 255 lb (115.667 kg)  Physical Exam  Constitutional: He is oriented to person, place, and time and well-developed, well-nourished, and in no distress.  HENT:  Head: Normocephalic and atraumatic.  Right Ear: External ear normal.  Left Ear: External ear normal.  Nose: Nose normal.  Eyes: Conjunctivae and EOM are normal. Pupils are equal, round, and reactive to light.  Neck: Normal range of motion. Neck supple.  Cardiovascular: Normal rate, regular rhythm, normal heart sounds and intact distal pulses.   Pulmonary/Chest: Effort normal and breath sounds normal.  Abdominal: Soft.  Musculoskeletal: Normal range of motion.  Neurological: He is alert and oriented to person, place, and time. He has normal reflexes. Gait normal. GCS score is 15.  Skin: Skin is warm and dry.  Lipoma left media upper arm.   Psychiatric: Mood, memory,  affect and judgment normal.    Lab Results  Component Value Date   WBC 5.2 02/05/2015   HGB 13.8 02/05/2015   HCT 42 02/05/2015   PLT 208 02/05/2015   GLUCOSE 327* 12/06/2012   CHOL 112 06/10/2015   TRIG 190* 06/10/2015   HDL 27* 06/10/2015   LDLCALC 47 06/10/2015   TSH 2.99 02/05/2015   PSA 2.9 07/08/2014   HGBA1C 6.6 10/08/2015    CMP     Component Value Date/Time   NA 143 02/05/2015   NA 142 12/06/2012 0426   K 4.4 02/05/2015   K 4.3 12/06/2012 0426   CL 114* 12/06/2012 0426   CO2 20* 12/06/2012 0426   GLUCOSE 327* 12/06/2012 0426   BUN 18 02/05/2015   BUN 35* 12/06/2012 0426   CREATININE 0.7 02/05/2015   CREATININE 1.26 12/06/2012 0426   CALCIUM 10.1 12/06/2012 0426   PROT 7.3 12/05/2012 2208   ALBUMIN 3.8 12/05/2012 2208   AST 15 02/05/2015   AST 24 12/05/2012 2208   ALT 16  02/05/2015   ALT 34 12/05/2012 2208   ALKPHOS 104 02/05/2015   ALKPHOS 151* 12/05/2012 2208   BILITOT 0.2 12/05/2012 2208   GFRNONAA 57* 12/06/2012 0426   GFRAA >60 12/06/2012 0426    Assessment and Plan :  1. Diabetic polyneuropathy associated with type 2 diabetes mellitus (HCC)  - POCT HgB A1C 6.7 today.   2. Essential hypertension Stable.   3. Lipoma of arm  May consider general surgery referral in the future.  4. Obesity I have done the exam and reviewed the above chart and it is accurate to the best of my knowledge.  Patient was seen and examined by Dr. Miguel Aschoff, and noted scribed by Webb Laws, [place] MD [facility] 02/02/2016 4:14 PM

## 2016-02-08 ENCOUNTER — Ambulatory Visit: Payer: Medicare Other | Admitting: Family Medicine

## 2016-02-15 DIAGNOSIS — H35033 Hypertensive retinopathy, bilateral: Secondary | ICD-10-CM | POA: Diagnosis not present

## 2016-02-19 ENCOUNTER — Other Ambulatory Visit
Admission: RE | Admit: 2016-02-19 | Discharge: 2016-02-19 | Disposition: A | Discharge status: Home / Self Care | Payer: Medicare Other | Source: Ambulatory Visit | Attending: Ophthalmology | Admitting: Ophthalmology

## 2016-02-19 DIAGNOSIS — H35031 Hypertensive retinopathy, right eye: Secondary | ICD-10-CM | POA: Diagnosis not present

## 2016-02-19 DIAGNOSIS — R51 Headache: Secondary | ICD-10-CM | POA: Insufficient documentation

## 2016-02-19 LAB — PLATELET COUNT: Platelets: 174 10*3/uL (ref 150–440)

## 2016-02-19 LAB — C-REACTIVE PROTEIN: CRP: 0.8 mg/dL (ref <1.0)

## 2016-02-19 LAB — SEDIMENTATION RATE: Sed Rate: 9 mm/hr (ref 0–20)

## 2016-02-29 DIAGNOSIS — I10 Essential (primary) hypertension: Secondary | ICD-10-CM | POA: Diagnosis not present

## 2016-02-29 DIAGNOSIS — I340 Nonrheumatic mitral (valve) insufficiency: Secondary | ICD-10-CM | POA: Diagnosis not present

## 2016-02-29 DIAGNOSIS — I119 Hypertensive heart disease without heart failure: Secondary | ICD-10-CM | POA: Diagnosis not present

## 2016-02-29 DIAGNOSIS — I2510 Atherosclerotic heart disease of native coronary artery without angina pectoris: Secondary | ICD-10-CM | POA: Diagnosis not present

## 2016-02-29 DIAGNOSIS — G4733 Obstructive sleep apnea (adult) (pediatric): Secondary | ICD-10-CM | POA: Diagnosis not present

## 2016-02-29 DIAGNOSIS — E782 Mixed hyperlipidemia: Secondary | ICD-10-CM | POA: Diagnosis not present

## 2016-02-29 DIAGNOSIS — E119 Type 2 diabetes mellitus without complications: Secondary | ICD-10-CM | POA: Diagnosis not present

## 2016-02-29 DIAGNOSIS — R600 Localized edema: Secondary | ICD-10-CM | POA: Diagnosis not present

## 2016-02-29 DIAGNOSIS — H35031 Hypertensive retinopathy, right eye: Secondary | ICD-10-CM | POA: Diagnosis not present

## 2016-03-01 ENCOUNTER — Other Ambulatory Visit (HOSPITAL_COMMUNITY): Payer: Self-pay | Admitting: Surgery

## 2016-03-02 ENCOUNTER — Other Ambulatory Visit: Payer: Self-pay | Admitting: Surgery

## 2016-03-09 ENCOUNTER — Other Ambulatory Visit: Payer: Self-pay | Admitting: Surgery

## 2016-03-09 ENCOUNTER — Ambulatory Visit
Admission: RE | Admit: 2016-03-09 | Discharge: 2016-03-09 | Disposition: A | Discharge status: Home / Self Care | Payer: Medicare Other | Source: Ambulatory Visit | Attending: Surgery | Admitting: Surgery

## 2016-03-09 DIAGNOSIS — J439 Emphysema, unspecified: Secondary | ICD-10-CM | POA: Diagnosis not present

## 2016-03-09 DIAGNOSIS — D351 Benign neoplasm of parathyroid gland: Secondary | ICD-10-CM | POA: Insufficient documentation

## 2016-03-09 DIAGNOSIS — Z01812 Encounter for preprocedural laboratory examination: Secondary | ICD-10-CM | POA: Insufficient documentation

## 2016-03-09 LAB — POCT I-STAT CREATININE: CREATININE: 1 mg/dL (ref 0.61–1.24)

## 2016-03-09 MED ORDER — IOPAMIDOL (ISOVUE-300) INJECTION 61%
75.0000 mL | Freq: Once | INTRAVENOUS | Status: AC | PRN
  Administered 2016-03-09: 75 mL via INTRAVENOUS

## 2016-03-10 ENCOUNTER — Other Ambulatory Visit: Payer: Self-pay | Admitting: Surgery

## 2016-03-11 ENCOUNTER — Ambulatory Visit
Admission: RE | Admit: 2016-03-11 | Discharge: 2016-03-11 | Disposition: A | Discharge status: Home / Self Care | Payer: Medicare Other | Source: Ambulatory Visit | Attending: Surgery | Admitting: Surgery

## 2016-03-11 DIAGNOSIS — J439 Emphysema, unspecified: Secondary | ICD-10-CM | POA: Diagnosis not present

## 2016-03-11 DIAGNOSIS — D351 Benign neoplasm of parathyroid gland: Secondary | ICD-10-CM | POA: Diagnosis not present

## 2016-03-11 LAB — POCT I-STAT CREATININE: Creatinine, Ser: 0.9 mg/dL (ref 0.61–1.24)

## 2016-03-11 MED ORDER — IOPAMIDOL (ISOVUE-370) INJECTION 76%
75.0000 mL | Freq: Once | INTRAVENOUS | Status: AC | PRN
  Administered 2016-03-11: 75 mL via INTRAVENOUS

## 2016-03-22 ENCOUNTER — Other Ambulatory Visit: Payer: Self-pay | Admitting: Ophthalmology

## 2016-03-22 ENCOUNTER — Ambulatory Visit
Admission: RE | Admit: 2016-03-22 | Discharge: 2016-03-22 | Disposition: A | Discharge status: Home / Self Care | Payer: Medicare Other | Source: Ambulatory Visit | Attending: Ophthalmology | Admitting: Ophthalmology

## 2016-03-22 ENCOUNTER — Other Ambulatory Visit
Admission: RE | Admit: 2016-03-22 | Discharge: 2016-03-22 | Disposition: A | Discharge status: Home / Self Care | Payer: Medicare Other | Source: Ambulatory Visit | Attending: Ophthalmology | Admitting: Ophthalmology

## 2016-03-22 DIAGNOSIS — R9389 Abnormal findings on diagnostic imaging of other specified body structures: Secondary | ICD-10-CM

## 2016-03-22 DIAGNOSIS — Z139 Encounter for screening, unspecified: Secondary | ICD-10-CM | POA: Diagnosis not present

## 2016-03-22 DIAGNOSIS — H35031 Hypertensive retinopathy, right eye: Secondary | ICD-10-CM | POA: Diagnosis not present

## 2016-03-22 DIAGNOSIS — Z122 Encounter for screening for malignant neoplasm of respiratory organs: Secondary | ICD-10-CM

## 2016-03-22 DIAGNOSIS — C3490 Malignant neoplasm of unspecified part of unspecified bronchus or lung: Secondary | ICD-10-CM | POA: Diagnosis not present

## 2016-03-22 DIAGNOSIS — H3581 Retinal edema: Secondary | ICD-10-CM

## 2016-03-23 LAB — MISC LABCORP TEST (SEND OUT): Labcorp test code: 258004

## 2016-03-25 DIAGNOSIS — H4311 Vitreous hemorrhage, right eye: Secondary | ICD-10-CM | POA: Diagnosis not present

## 2016-04-04 DIAGNOSIS — H4311 Vitreous hemorrhage, right eye: Secondary | ICD-10-CM | POA: Diagnosis not present

## 2016-04-05 ENCOUNTER — Encounter: Payer: Self-pay | Admitting: Family Medicine

## 2016-04-05 ENCOUNTER — Ambulatory Visit (INDEPENDENT_AMBULATORY_CARE_PROVIDER_SITE_OTHER): Payer: Medicare Other | Admitting: Family Medicine

## 2016-04-05 VITALS — BP 148/70 | HR 60 | Temp 98.0°F | Resp 16 | Wt 250.0 lb

## 2016-04-05 DIAGNOSIS — N492 Inflammatory disorders of scrotum: Secondary | ICD-10-CM

## 2016-04-05 MED ORDER — DOXYCYCLINE MONOHYDRATE 100 MG PO CAPS: 100.0000 mg | ORAL_CAPSULE | Freq: Two times a day (BID) | ORAL | Status: DC

## 2016-04-05 NOTE — Progress Notes (Addendum)
Patient ID: Zachary Martinez, male   DOB: Dec 19, 1940, 75 y.o.   MRN: [id number]    Subjective:  HPI Pt is here for at tick bite located in his testicular area. He reports that he found it about 3 days ago. He reports that the area is red has a "knot on it" and the knot is getting bigger. He reports that the entire genital area was red earlier today, but has not looked since. The area itches and uncomfortable. Pt denies fever, chills or body aches. He has found about 6 ticks on his this year already. 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 Retina specialist said that he thought it was blood in his eye.   Prior to Admission medications   Medication Sig Start Date End Date Taking? Authorizing Provider  amLODipine (NORVASC) 10 MG tablet TAKE 1 TABLET EVERY DAY 01/11/16  Yes Jerrol Banana., MD  aspirin EC 81 MG tablet Take by mouth.   Yes Historical Provider, MD  atorvastatin (LIPITOR) 40 MG tablet TAKE 1 TABLET EVERY DAY 06/03/15  Yes Jerrol Banana., MD  Canagliflozin-Metformin HCl (INVOKAMET) [phone] MG TABS Take 1 tablet by mouth 2 (two) times daily. 10/08/15  Yes Dalaysia Harms Maceo Pro., MD  carvedilol (COREG) 6.25 MG tablet TAKE 1 TABLET TWICE DAILY 04/19/15  Yes Jerrol Banana., MD  Cholecalciferol (VITAMIN D3) 1000 UNITS CAPS Take 1 capsule by mouth daily.   Yes Historical Provider, MD  EPINEPHRINE, ANAPHYLAXIS THERAPY AGENTS, EPIPEN 2-PAK, 0.'3MG'$ /0.3ML (Injection Device) - Historical Medication  as directed (0.3 MG/0.3ML) Active   Yes Historical Provider, MD  hydrALAZINE (APRESOLINE) 100 MG tablet Take by mouth. 02/05/15  Yes Historical Provider, MD  levothyroxine (SYNTHROID, LEVOTHROID) 88 MCG tablet TAKE 1 TABLET EVERY DAY 01/11/16  Yes Jerrol Banana., MD  pioglitazone (ACTOS) 30 MG tablet TAKE 1 TABLET EVERY DAY 05/24/15  Yes Jerrol Banana., MD  sildenafil (VIAGRA)  100 MG tablet Take by mouth. 06/16/14  Yes Historical Provider, MD    Patient Active Problem List   Diagnosis Date Noted  . Type 2 diabetes mellitus ([place]) 10/08/2015  . Abnormal chest x-ray 05/09/2015  . CAD in native artery 05/09/2015  . Benign fibroma of prostate 05/09/2015  . Arteriosclerosis of coronary artery 05/09/2015  . Diabetes mellitus with polyneuropathy ([place]) 05/09/2015  . Diverticulosis of colon 05/09/2015  . ED (erectile dysfunction) of organic origin 05/09/2015  . Family history of colon cancer 05/09/2015  . HLD (hyperlipidemia) 05/09/2015  . Adult hypothyroidism 05/09/2015  . Adiposity 05/09/2015  . Arthritis, degenerative 05/09/2015  . Parathyroid adenoma 05/09/2015  . Apnea, sleep 05/09/2015  . Disorder of bursae and tendons in shoulder region 12/22/2014  . Hypertensive left ventricular hypertrophy 11/24/2014  . MI (mitral incompetence) 11/24/2014  . Obstructive apnea 11/24/2014    Past Medical History  Diagnosis Date  . Diabetes ([place])   . CAD (coronary artery disease)   . Hypertension   . Hyperlipidemia   . Sleep apnea     cpap  . Hypothyroidism     Social History   Social History  . Marital Status: Married    Spouse Name: N/A  . Number of Children: N/A  . Years of Education: N/A   Occupational History  . Not on file.   Social History Main Topics  . Smoking status: Former Smoker  Quit date: 07/04/1979  . Smokeless tobacco: Former Systems developer    Quit date: 11/01/1979  . Alcohol Use: No  . Drug Use: No  . Sexual Activity: Not on file   Other Topics Concern  . Not on file   Social History Narrative    Allergies  Allergen Reactions  . Ace Inhibitors Anaphylaxis    Review of Systems  Constitutional: Negative.   HENT: Negative.   Eyes: Positive for blurred vision.  Respiratory: Negative.   Cardiovascular: Negative.   Gastrointestinal: Negative.   Genitourinary: Negative.   Musculoskeletal: Negative.   Skin: Positive for itching and  rash.  Neurological: Negative.   Endo/Heme/Allergies: Negative.   Psychiatric/Behavioral: Negative.     Immunization History  Administered Date(s) Administered  . Influenza-Unspecified 09/07/2015  . Pneumococcal Conjugate-13 06/16/2014  . Pneumococcal Polysaccharide-23 06/18/2012  . Td 11/21/2003, 01/24/2015  . Zoster 06/18/2012   Objective:  BP 148/70 mmHg  Pulse 60  Temp(Src) 98 F (36.7 C) (Oral)  Resp 16  Wt 250 lb (113.399 kg)  Physical Exam  Constitutional: He is oriented to person, place, and time and well-developed, well-nourished, and in no distress.  HENT:  Head: Normocephalic and atraumatic.  Right Ear: External ear normal.  Left Ear: External ear normal.  Nose: Nose normal.  Neck: Neck supple.  Cardiovascular: Normal rate and regular rhythm.   Pulmonary/Chest: Effort normal and breath sounds normal.  Abdominal: Soft.  Neurological: He is alert and oriented to person, place, and time.  Skin: Skin is warm and dry. There is erythema.  The proximal portion of the left shaft of the penis is indurated and tender, the left groin is erythematous without any obvious lymph node swelling.  Psychiatric: Mood, memory, affect and judgment normal.  Left side of the scrotum is also swollen and indurated  Lab Results  Component Value Date   WBC 5.2 02/05/2015   HGB 13.8 02/05/2015   HCT 42 02/05/2015   PLT 174 02/19/2016   GLUCOSE 327* 12/06/2012   CHOL 112 06/10/2015   TRIG 190* 06/10/2015   HDL 27* 06/10/2015   LDLCALC 47 06/10/2015   TSH 2.99 02/05/2015   PSA 2.9 07/08/2014   HGBA1C 6.7 02/02/2016    CMP     Component Value Date/Time   NA 143 02/05/2015   NA 142 12/06/2012 0426   K 4.4 02/05/2015   K 4.3 12/06/2012 0426   CL 114* 12/06/2012 0426   CO2 20* 12/06/2012 0426   GLUCOSE 327* 12/06/2012 0426   BUN 18 02/05/2015   BUN 35* 12/06/2012 0426   CREATININE 0.90 03/11/2016 1333   CREATININE 0.7 02/05/2015   CREATININE 1.26 12/06/2012 0426    CALCIUM 10.1 12/06/2012 0426   PROT 7.3 12/05/2012 2208   ALBUMIN 3.8 12/05/2012 2208   AST 15 02/05/2015   AST 24 12/05/2012 2208   ALT 16 02/05/2015   ALT 34 12/05/2012 2208   ALKPHOS 104 02/05/2015   ALKPHOS 151* 12/05/2012 2208   BILITOT 0.2 12/05/2012 2208   GFRNONAA 57* 12/06/2012 0426   GFRAA >60 12/06/2012 0426    Assessment and Plan :  1. Cellulitis, scrotum Treat with Doxy and follow up in 2 days.   I have done the exam and reviewed the above chart and it is accurate to the best of my knowledge.  Miguel Aschoff MD [facility] 04/05/2016 2:38 PM

## 2016-04-07 ENCOUNTER — Ambulatory Visit: Payer: Medicare Other | Admitting: Family Medicine

## 2016-04-07 ENCOUNTER — Encounter: Payer: Self-pay | Admitting: Family Medicine

## 2016-04-07 VITALS — BP 134/58 | HR 64 | Temp 97.9°F | Resp 16 | Wt 253.0 lb

## 2016-04-07 DIAGNOSIS — N492 Inflammatory disorders of scrotum: Secondary | ICD-10-CM

## 2016-04-07 NOTE — Progress Notes (Signed)
Patient ID: Zachary Martinez, male   DOB: 06-29-1941, 75 y.o.   MRN: [id number]    Subjective:  HPI Pt is here for a 2 day follow up of cellulitis of the scrotum. He was started on Doxy. He reports that he is still itching but the swelling is gone down and he is feeling better. Pt denies any side effects from the antibiotic.   Prior to Admission medications   Medication Sig Start Date End Date Taking? Authorizing Provider  amLODipine (NORVASC) 10 MG tablet TAKE 1 TABLET EVERY DAY 01/11/16  Yes Jerrol Banana., MD  aspirin EC 81 MG tablet Take by mouth.   Yes Historical Provider, MD  atorvastatin (LIPITOR) 40 MG tablet TAKE 1 TABLET EVERY DAY 06/03/15  Yes Jerrol Banana., MD  Canagliflozin-Metformin HCl (INVOKAMET) [phone] MG TABS Take 1 tablet by mouth 2 (two) times daily. 10/08/15  Yes Richard Maceo Pro., MD  carvedilol (COREG) 6.25 MG tablet TAKE 1 TABLET TWICE DAILY 04/19/15  Yes Jerrol Banana., MD  Cholecalciferol (VITAMIN D3) 1000 UNITS CAPS Take 1 capsule by mouth daily.   Yes Historical Provider, MD  doxycycline (MONODOX) 100 MG capsule Take 1 capsule (100 mg total) by mouth 2 (two) times daily. 04/05/16  Yes Richard Maceo Pro., MD  EPINEPHRINE, ANAPHYLAXIS THERAPY AGENTS, EPIPEN 2-PAK, 0.'3MG'$ /0.3ML (Injection Device) - Historical Medication  as directed (0.3 MG/0.3ML) Active   Yes Historical Provider, MD  hydrALAZINE (APRESOLINE) 100 MG tablet Take by mouth. 02/05/15  Yes Historical Provider, MD  levothyroxine (SYNTHROID, LEVOTHROID) 88 MCG tablet TAKE 1 TABLET EVERY DAY 01/11/16  Yes Jerrol Banana., MD  pioglitazone (ACTOS) 30 MG tablet TAKE 1 TABLET EVERY DAY 05/24/15  Yes Jerrol Banana., MD    Patient Active Problem List   Diagnosis Date Noted  . Type 2 diabetes mellitus ([place]) 10/08/2015  . Abnormal chest x-ray 05/09/2015  . CAD in native artery 05/09/2015  . Benign fibroma of prostate 05/09/2015  . Arteriosclerosis of coronary artery 05/09/2015  .  Diabetes mellitus with polyneuropathy ([place]) 05/09/2015  . Diverticulosis of colon 05/09/2015  . ED (erectile dysfunction) of organic origin 05/09/2015  . Family history of colon cancer 05/09/2015  . HLD (hyperlipidemia) 05/09/2015  . Adult hypothyroidism 05/09/2015  . Adiposity 05/09/2015  . Arthritis, degenerative 05/09/2015  . Parathyroid adenoma 05/09/2015  . Apnea, sleep 05/09/2015  . Disorder of bursae and tendons in shoulder region 12/22/2014  . Hypertensive left ventricular hypertrophy 11/24/2014  . MI (mitral incompetence) 11/24/2014  . Obstructive apnea 11/24/2014    Past Medical History  Diagnosis Date  . Diabetes ([place])   . CAD (coronary artery disease)   . Hypertension   . Hyperlipidemia   . Sleep apnea     cpap  . Hypothyroidism     Social History   Social History  . Marital Status: Married    Spouse Name: N/A  . Number of Children: N/A  . Years of Education: N/A   Occupational History  . Not on file.   Social History Main Topics  . Smoking status: Former Smoker    Quit date: 07/04/1979  . Smokeless tobacco: Former Systems developer    Quit date: 11/01/1979  . Alcohol Use: No  . Drug Use: No  . Sexual Activity: Not on file   Other Topics Concern  . Not on file   Social History Narrative    Allergies  Allergen Reactions  . Ace Inhibitors Anaphylaxis  Review of Systems  Constitutional: Negative.   HENT: Negative.   Eyes: Negative.   Respiratory: Negative.   Cardiovascular: Negative.   Gastrointestinal: Negative.   Genitourinary: Negative.   Musculoskeletal: Negative.   Skin: Positive for itching.  Neurological: Negative.   Endo/Heme/Allergies: Negative.   Psychiatric/Behavioral: Negative.     Immunization History  Administered Date(s) Administered  . Influenza-Unspecified 09/07/2015  . Pneumococcal Conjugate-13 06/16/2014  . Pneumococcal Polysaccharide-23 06/18/2012  . Td 11/21/2003, 01/24/2015  . Zoster 06/18/2012   Objective:  BP  134/58 mmHg  Pulse 64  Temp(Src) 97.9 F (36.6 C) (Oral)  Resp 16  Wt 253 lb (114.76 kg)  Physical Exam  Constitutional: He is well-developed, well-nourished, and in no distress.  HENT:  Head: Normocephalic and atraumatic.  Right Ear: External ear normal.  Left Ear: External ear normal.  Nose: Nose normal.  Cardiovascular: Normal rate and regular rhythm.   Pulmonary/Chest: Effort normal and breath sounds normal.  Abdominal: Soft.  Genitourinary: Penis normal.  Left side of the scrotum and the left shaft of the penis are markedly improved with induration and swelling. Erythema of the left groin improved. Still no adenopathy.    Lab Results  Component Value Date   WBC 5.2 02/05/2015   HGB 13.8 02/05/2015   HCT 42 02/05/2015   PLT 174 02/19/2016   GLUCOSE 327* 12/06/2012   CHOL 112 06/10/2015   TRIG 190* 06/10/2015   HDL 27* 06/10/2015   LDLCALC 47 06/10/2015   TSH 2.99 02/05/2015   PSA 2.9 07/08/2014   HGBA1C 6.7 02/02/2016    CMP     Component Value Date/Time   NA 143 02/05/2015   NA 142 12/06/2012 0426   K 4.4 02/05/2015   K 4.3 12/06/2012 0426   CL 114* 12/06/2012 0426   CO2 20* 12/06/2012 0426   GLUCOSE 327* 12/06/2012 0426   BUN 18 02/05/2015   BUN 35* 12/06/2012 0426   CREATININE 0.90 03/11/2016 1333   CREATININE 0.7 02/05/2015   CREATININE 1.26 12/06/2012 0426   CALCIUM 10.1 12/06/2012 0426   PROT 7.3 12/05/2012 2208   ALBUMIN 3.8 12/05/2012 2208   AST 15 02/05/2015   AST 24 12/05/2012 2208   ALT 16 02/05/2015   ALT 34 12/05/2012 2208   ALKPHOS 104 02/05/2015   ALKPHOS 151* 12/05/2012 2208   BILITOT 0.2 12/05/2012 2208   GFRNONAA 57* 12/06/2012 0426   GFRAA >60 12/06/2012 0426    Assessment and Plan :  1. Cellulitis, scrotum Markedly improved since last visit. Finish antibiotics.   Miguel Aschoff MD [facility] 04/07/2016 9:42 AM

## 2016-04-22 DIAGNOSIS — H4311 Vitreous hemorrhage, right eye: Secondary | ICD-10-CM | POA: Diagnosis not present

## 2016-04-22 DIAGNOSIS — H542 Low vision, both eyes: Secondary | ICD-10-CM | POA: Diagnosis not present

## 2016-04-22 DIAGNOSIS — H2513 Age-related nuclear cataract, bilateral: Secondary | ICD-10-CM | POA: Diagnosis not present

## 2016-04-25 ENCOUNTER — Other Ambulatory Visit: Payer: Self-pay | Admitting: Family Medicine

## 2016-06-07 ENCOUNTER — Ambulatory Visit (INDEPENDENT_AMBULATORY_CARE_PROVIDER_SITE_OTHER): Payer: Medicare Other | Admitting: Family Medicine

## 2016-06-07 VITALS — BP 140/76 | HR 60 | Temp 97.7°F | Resp 16 | Wt 253.0 lb

## 2016-06-07 DIAGNOSIS — E038 Other specified hypothyroidism: Secondary | ICD-10-CM

## 2016-06-07 DIAGNOSIS — I10 Essential (primary) hypertension: Secondary | ICD-10-CM | POA: Diagnosis not present

## 2016-06-07 DIAGNOSIS — E1142 Type 2 diabetes mellitus with diabetic polyneuropathy: Secondary | ICD-10-CM

## 2016-06-07 DIAGNOSIS — E785 Hyperlipidemia, unspecified: Secondary | ICD-10-CM | POA: Diagnosis not present

## 2016-06-07 MED ORDER — PIOGLITAZONE HCL 30 MG PO TABS: 30.0000 mg | ORAL_TABLET | Freq: Every day | ORAL | 3 refills | Status: DC

## 2016-06-07 NOTE — Progress Notes (Signed)
Zachary Martinez  MRN: [id number] DOB: November 29, 1940  Subjective:  HPI   1. Diabetic polyneuropathy associated with type 2 diabetes mellitus [facility]) The patient is a 75 year old male who presents for follow up of his diabetes.  His last visit was on 04/07/16.  His last A1C was done on 02/02/16 and it was 6.7.  He checks his glucose about twice weekly and it has been running in the 120s with 1 reading in the 130s.  He does need to have his Pioglitazone refilled today.  He has good tolerance and compliance of the medications.  He does request samples of the Invokamet due to being in the Medicare donut hole and it is costing him a lot.  2. Essential hypertension Patient also here for his blood pressure follow up.  He checks his BP on occasion and states he has been getting readings around 130s-140s over 70s.    3. Other specified hypothyroidism It is time for the patient t have his TSH checked.  4. HLD (hyperlipidemia) Patient is due to have his lipids checked.  Patient has had a work up for the elevated calcium level.  He has had 2 ultrasounds and a CT scan and some labs.  They have not found any cause for the elevated calcium and the patient states that he was last told that they would contact him in the fall for follow up labs.  Patient has been having a problem with his right eye that he is seeing Zachary Martinez.  The patient states that he was having a veil to his right eye sight.  He states that the eye doctor told him that he did not have a tear in the retina but did have a vessel that bled into that area causing the problem.  He states that it came from a separation of the gel in his eye and that it should resolve by absorbing the blood.  The patient is concerned that it is taking so long as he said the problem first started in April.  He has an appointment with Zachary Martinez on June 14, 2016.  He said at this point he is going to request that they remove the blood surgically as they had told him  in the past that this was an option.   Patient Active Problem List   Diagnosis Date Noted  . Type 2 diabetes mellitus ([place]) 10/08/2015  . Abnormal chest x-ray 05/09/2015  . CAD in native artery 05/09/2015  . Benign fibroma of prostate 05/09/2015  . Arteriosclerosis of coronary artery 05/09/2015  . Diabetes mellitus with polyneuropathy ([place]) 05/09/2015  . Diverticulosis of colon 05/09/2015  . ED (erectile dysfunction) of organic origin 05/09/2015  . Family history of colon cancer 05/09/2015  . HLD (hyperlipidemia) 05/09/2015  . Adult hypothyroidism 05/09/2015  . Adiposity 05/09/2015  . Arthritis, degenerative 05/09/2015  . Parathyroid adenoma 05/09/2015  . Apnea, sleep 05/09/2015  . Disorder of bursae and tendons in shoulder region 12/22/2014  . Hypertensive left ventricular hypertrophy 11/24/2014  . MI (mitral incompetence) 11/24/2014  . Obstructive apnea 11/24/2014    Past Medical History:  Diagnosis Date  . CAD (coronary artery disease)   . Diabetes ([place])   . Hyperlipidemia   . Hypertension   . Hypothyroidism   . Sleep apnea    cpap    Social History   Social History  . Marital status: Married    Spouse name: N/A  . Number of children: N/A  . Years  of education: N/A   Occupational History  . Not on file.   Social History Main Topics  . Smoking status: Former Smoker    Quit date: 07/04/1979  . Smokeless tobacco: Former Systems developer    Quit date: 11/01/1979  . Alcohol use No  . Drug use: No  . Sexual activity: Not on file   Other Topics Concern  . Not on file   Social History Narrative  . No narrative on file    Outpatient Medications Prior to Visit  Medication Sig Dispense Refill  . amLODipine (NORVASC) 10 MG tablet TAKE 1 TABLET EVERY DAY 90 tablet 3  . aspirin EC 81 MG tablet Take by mouth.    Marland Kitchen atorvastatin (LIPITOR) 40 MG tablet TAKE 1 TABLET EVERY DAY 90 tablet 3  . Canagliflozin-Metformin HCl (INVOKAMET) [phone] MG TABS Take 1 tablet by mouth 2 (two)  times daily. 180 tablet 3  . carvedilol (COREG) 6.25 MG tablet TAKE 1 TABLET TWICE DAILY 180 tablet 4  . Cholecalciferol (VITAMIN D3) 1000 UNITS CAPS Take 1 capsule by mouth daily.    Marland Kitchen EPINEPHRINE, ANAPHYLAXIS THERAPY AGENTS, EPIPEN 2-PAK, 0.'3MG'$ /0.3ML (Injection Device) - Historical Medication  as directed (0.3 MG/0.3ML) Active    . hydrALAZINE (APRESOLINE) 100 MG tablet TAKE 1 TABLET TWICE DAILY 180 tablet 3  . levothyroxine (SYNTHROID, LEVOTHROID) 88 MCG tablet TAKE 1 TABLET EVERY DAY 90 tablet 3  . pioglitazone (ACTOS) 30 MG tablet TAKE 1 TABLET EVERY DAY 90 tablet 3  . doxycycline (MONODOX) 100 MG capsule Take 1 capsule (100 mg total) by mouth 2 (two) times daily. 14 capsule 0   No facility-administered medications prior to visit.     Allergies  Allergen Reactions  . Ace Inhibitors Anaphylaxis and Other (See Comments)    Review of Systems  Constitutional: Positive for malaise/fatigue. Negative for fever.  Eyes: Positive for blurred vision and photophobia. Negative for double vision, pain, discharge and redness.  Respiratory: Negative for cough, shortness of breath and wheezing.   Cardiovascular: Positive for leg swelling. Negative for chest pain, palpitations, orthopnea, claudication and PND.  Gastrointestinal: Negative.   Genitourinary: Negative.   Musculoskeletal: Positive for joint pain.  Skin: Negative.   Neurological: Negative for dizziness, weakness and headaches.  Endo/Heme/Allergies: Negative.   Psychiatric/Behavioral: Negative.    Objective:  BP 140/76   Pulse 60   Temp 97.7 F (36.5 C) (Oral)   Resp 16   Wt 253 lb (114.8 kg)   BMI 37.36 kg/m   Physical Exam  Constitutional: He is oriented to person, place, and time and well-developed, well-nourished, and in no distress.  HENT:  Head: Normocephalic and atraumatic.  Right Ear: External ear normal.  Left Ear: External ear normal.  Nose: Nose normal.  Eyes: Conjunctivae are normal. Pupils are equal, round,  and reactive to light. No scleral icterus.  Neck: Normal range of motion. Neck supple.  Cardiovascular: Normal rate, regular rhythm and normal heart sounds.   Pulmonary/Chest: Effort normal and breath sounds normal.  Abdominal: Soft.  Musculoskeletal: He exhibits edema (2+ left and 1+ right).  Neurological: He is alert and oriented to person, place, and time. Gait normal.  Skin: Skin is warm and dry.  Psychiatric: Mood, memory, affect and judgment normal.    Assessment and Plan :   1. Diabetic polyneuropathy associated with type 2 diabetes mellitus (HCC)  - Hemoglobin A1c - pioglitazone (ACTOS) 30 MG tablet; Take 1 tablet (30 mg total) by mouth daily.  Dispense: 90 tablet; Refill: 3  2. Essential hypertension  - CBC with Differential/Platelet - Comprehensive metabolic panel  3. Other specified hypothyroidism  - TSH  4. HLD (hyperlipidemia)  - Lipid Panel With LDL/HDL Ratio 5. Venous insufficiency Recommended support hose today. We'll be happy to refer to vascular. 6. Hypercalcemia with elevated PTH Followed by Dr. Eloisa Northern from endocrine surgery 7. Morbid obesity Dietary measures for key going forward. AWE end of 2017. Patient was seen and examined by Dr. Delfino Lovett L. [place] Mon and the note was scribed by Althea Charon, RMA.  Miguel Aschoff MD [facility] 06/07/2016 8:10 AM

## 2016-06-08 ENCOUNTER — Telehealth: Payer: Self-pay

## 2016-06-08 ENCOUNTER — Ambulatory Visit: Payer: Medicare Other | Admitting: Family Medicine

## 2016-06-08 LAB — CBC WITH DIFFERENTIAL/PLATELET
BASOS ABS: 0 10*3/uL (ref 0.0–0.2)
Basos: 0 %
EOS (ABSOLUTE): 0.1 10*3/uL (ref 0.0–0.4)
EOS: 2 %
HEMOGLOBIN: 13.6 g/dL (ref 12.6–17.7)
Hematocrit: 40.9 % (ref 37.5–51.0)
IMMATURE GRANS (ABS): 0 10*3/uL (ref 0.0–0.1)
IMMATURE GRANULOCYTES: 0 %
LYMPHS: 27 %
Lymphocytes Absolute: 1.3 10*3/uL (ref 0.7–3.1)
MCH: 30.4 pg (ref 26.6–33.0)
MCHC: 33.3 g/dL (ref 31.5–35.7)
MCV: 91 fL (ref 79–97)
MONOCYTES: 8 %
Monocytes Absolute: 0.4 10*3/uL (ref 0.1–0.9)
Neutrophils Absolute: 3 10*3/uL (ref 1.4–7.0)
Neutrophils: 63 %
PLATELETS: 166 10*3/uL (ref 150–379)
RBC: 4.48 x10E6/uL (ref 4.14–5.80)
RDW: 15.4 % (ref 12.3–15.4)
WBC: 4.8 10*3/uL (ref 3.4–10.8)

## 2016-06-08 LAB — TSH: TSH: 3.24 u[IU]/mL (ref 0.450–4.500)

## 2016-06-08 LAB — COMPREHENSIVE METABOLIC PANEL
ALBUMIN: 3.8 g/dL (ref 3.5–4.8)
ALT: 15 IU/L (ref 0–44)
AST: 15 IU/L (ref 0–40)
Albumin/Globulin Ratio: 1.6 (ref 1.2–2.2)
Alkaline Phosphatase: 92 IU/L (ref 39–117)
BILIRUBIN TOTAL: 0.4 mg/dL (ref 0.0–1.2)
BUN / CREAT RATIO: 24 (ref 10–24)
BUN: 20 mg/dL (ref 8–27)
CHLORIDE: 106 mmol/L (ref 96–106)
CO2: 23 mmol/L (ref 18–29)
CREATININE: 0.84 mg/dL (ref 0.76–1.27)
Calcium: 10.4 mg/dL — ABNORMAL HIGH (ref 8.6–10.2)
GFR calc non Af Amer: 87 mL/min/{1.73_m2} (ref >59)
GFR, EST AFRICAN AMERICAN: 100 mL/min/{1.73_m2} (ref >59)
GLUCOSE: 136 mg/dL — AB (ref 65–99)
Globulin, Total: 2.4 g/dL (ref 1.5–4.5)
Potassium: 4.6 mmol/L (ref 3.5–5.2)
Sodium: 141 mmol/L (ref 134–144)
TOTAL PROTEIN: 6.2 g/dL (ref 6.0–8.5)

## 2016-06-08 LAB — LIPID PANEL WITH LDL/HDL RATIO
CHOLESTEROL TOTAL: 102 mg/dL (ref 100–199)
HDL: 28 mg/dL — AB (ref >39)
LDL CALC: 39 mg/dL (ref 0–99)
LDL/HDL RATIO: 1.4 ratio (ref 0.0–3.6)
TRIGLYCERIDES: 176 mg/dL — AB (ref 0–149)
VLDL Cholesterol Cal: 35 mg/dL (ref 5–40)

## 2016-06-08 LAB — HEMOGLOBIN A1C
Est. average glucose Bld gHb Est-mCnc: 137 mg/dL
HEMOGLOBIN A1C: 6.4 % — AB (ref 4.8–5.6)

## 2016-06-08 NOTE — Telephone Encounter (Signed)
Left message to call back  

## 2016-06-08 NOTE — Telephone Encounter (Signed)
-----   Message from Jerrol Banana., MD sent at 06/08/2016  8:17 AM EDT ----- Labs stable.

## 2016-06-09 NOTE — Telephone Encounter (Signed)
Advised pt of lab results. Pt verbally acknowledges understanding. [name], CMA   

## 2016-06-13 ENCOUNTER — Other Ambulatory Visit: Payer: Self-pay | Admitting: Family Medicine

## 2016-06-13 DIAGNOSIS — I10 Essential (primary) hypertension: Secondary | ICD-10-CM

## 2016-06-14 DIAGNOSIS — H4311 Vitreous hemorrhage, right eye: Secondary | ICD-10-CM | POA: Diagnosis not present

## 2016-06-14 DIAGNOSIS — E11311 Type 2 diabetes mellitus with unspecified diabetic retinopathy with macular edema: Secondary | ICD-10-CM | POA: Diagnosis not present

## 2016-06-14 DIAGNOSIS — H269 Unspecified cataract: Secondary | ICD-10-CM | POA: Diagnosis not present

## 2016-06-20 DIAGNOSIS — G4730 Sleep apnea, unspecified: Secondary | ICD-10-CM | POA: Diagnosis not present

## 2016-06-20 DIAGNOSIS — I10 Essential (primary) hypertension: Secondary | ICD-10-CM | POA: Diagnosis not present

## 2016-06-20 DIAGNOSIS — H4311 Vitreous hemorrhage, right eye: Secondary | ICD-10-CM | POA: Diagnosis not present

## 2016-06-20 DIAGNOSIS — I2510 Atherosclerotic heart disease of native coronary artery without angina pectoris: Secondary | ICD-10-CM | POA: Diagnosis not present

## 2016-06-20 DIAGNOSIS — E785 Hyperlipidemia, unspecified: Secondary | ICD-10-CM | POA: Diagnosis not present

## 2016-06-20 DIAGNOSIS — Z79899 Other long term (current) drug therapy: Secondary | ICD-10-CM | POA: Diagnosis not present

## 2016-06-20 DIAGNOSIS — Z888 Allergy status to other drugs, medicaments and biological substances status: Secondary | ICD-10-CM | POA: Diagnosis not present

## 2016-06-20 DIAGNOSIS — Z87891 Personal history of nicotine dependence: Secondary | ICD-10-CM | POA: Diagnosis not present

## 2016-06-20 DIAGNOSIS — E1136 Type 2 diabetes mellitus with diabetic cataract: Secondary | ICD-10-CM | POA: Diagnosis not present

## 2016-06-20 DIAGNOSIS — Z7982 Long term (current) use of aspirin: Secondary | ICD-10-CM | POA: Diagnosis not present

## 2016-06-20 DIAGNOSIS — E039 Hypothyroidism, unspecified: Secondary | ICD-10-CM | POA: Diagnosis not present

## 2016-08-11 DIAGNOSIS — H4311 Vitreous hemorrhage, right eye: Secondary | ICD-10-CM | POA: Diagnosis not present

## 2016-08-24 DIAGNOSIS — Z23 Encounter for immunization: Secondary | ICD-10-CM | POA: Diagnosis not present

## 2016-08-31 DIAGNOSIS — E119 Type 2 diabetes mellitus without complications: Secondary | ICD-10-CM | POA: Diagnosis not present

## 2016-08-31 DIAGNOSIS — G4733 Obstructive sleep apnea (adult) (pediatric): Secondary | ICD-10-CM | POA: Diagnosis not present

## 2016-08-31 DIAGNOSIS — E782 Mixed hyperlipidemia: Secondary | ICD-10-CM | POA: Diagnosis not present

## 2016-08-31 DIAGNOSIS — I2510 Atherosclerotic heart disease of native coronary artery without angina pectoris: Secondary | ICD-10-CM | POA: Diagnosis not present

## 2016-10-13 ENCOUNTER — Other Ambulatory Visit: Payer: Self-pay | Admitting: Family Medicine

## 2016-10-14 DIAGNOSIS — H2511 Age-related nuclear cataract, right eye: Secondary | ICD-10-CM | POA: Diagnosis not present

## 2016-10-17 DIAGNOSIS — H2511 Age-related nuclear cataract, right eye: Secondary | ICD-10-CM | POA: Diagnosis not present

## 2016-10-18 ENCOUNTER — Encounter: Payer: Self-pay | Admitting: Family Medicine

## 2016-10-18 ENCOUNTER — Ambulatory Visit (INDEPENDENT_AMBULATORY_CARE_PROVIDER_SITE_OTHER): Payer: Medicare Other | Admitting: Family Medicine

## 2016-10-18 VITALS — BP 142/60 | HR 58 | Temp 97.9°F | Resp 14 | Ht 69.0 in | Wt 261.0 lb

## 2016-10-18 DIAGNOSIS — Z0001 Encounter for general adult medical examination with abnormal findings: Secondary | ICD-10-CM | POA: Diagnosis not present

## 2016-10-18 DIAGNOSIS — H9313 Tinnitus, bilateral: Secondary | ICD-10-CM

## 2016-10-18 DIAGNOSIS — M1990 Unspecified osteoarthritis, unspecified site: Secondary | ICD-10-CM

## 2016-10-18 DIAGNOSIS — E1142 Type 2 diabetes mellitus with diabetic polyneuropathy: Secondary | ICD-10-CM | POA: Diagnosis not present

## 2016-10-18 DIAGNOSIS — L570 Actinic keratosis: Secondary | ICD-10-CM | POA: Diagnosis not present

## 2016-10-18 DIAGNOSIS — I10 Essential (primary) hypertension: Secondary | ICD-10-CM

## 2016-10-18 DIAGNOSIS — Z0000 Encounter for general adult medical examination without abnormal findings: Secondary | ICD-10-CM

## 2016-10-18 LAB — POCT UA - MICROALBUMIN: Microalbumin Ur, POC: 20 mg/L

## 2016-10-18 LAB — POCT GLYCOSYLATED HEMOGLOBIN (HGB A1C): HEMOGLOBIN A1C: 6.6

## 2016-10-18 NOTE — Progress Notes (Signed)
Patient: Zachary Martinez, Male    DOB: 1941/02/17, 75 y.o.   MRN: [id number] Visit Date: 10/18/2016  Today's Provider: [name], MD   Chief Complaint  Patient presents with  . Medicare Wellness   Subjective:   Zachary Martinez is a 75 y.o. male who presents today for his Subsequent Annual Wellness Visit. He feels well. He reports he is not exercising. He reports he is sleeping fairly well. Pt is having cataract surgery on January 2nd.  He does have chronic tinnitus which is slowly getting worse. His right shoulder pops and causes him some pain. He wears his CPAP nightly for OSA.  Colonoscopy 08/14/2014- Diverticulosis 2 polyps. Repeat in 5 years  Immunization History  Administered Date(s) Administered  . Influenza-Unspecified 09/07/2015  . Pneumococcal Conjugate-13 06/16/2014  . Pneumococcal Polysaccharide-23 06/18/2012  . Td 11/21/2003, 01/24/2015  . Zoster 06/18/2012     Review of Systems  Constitutional: Negative.   HENT: Positive for hearing loss and tinnitus.   Eyes: Positive for photophobia and visual disturbance.  Respiratory: Negative.   Cardiovascular: Positive for leg swelling.  Gastrointestinal: Negative.   Endocrine: Negative.   Genitourinary: Positive for enuresis and frequency.  Musculoskeletal: Positive for arthralgias.  Skin: Negative.   Allergic/Immunologic: Negative.   Neurological: Negative.   Hematological: Negative.   Psychiatric/Behavioral: Negative.     Patient Active Problem List   Diagnosis Date Noted  . Hypertension 06/07/2016  . Type 2 diabetes mellitus ([place]) 10/08/2015  . Abnormal chest x-ray 05/09/2015  . CAD in native artery 05/09/2015  . Benign fibroma of prostate 05/09/2015  . Arteriosclerosis of coronary artery 05/09/2015  . Diabetes mellitus with polyneuropathy ([place]) 05/09/2015  . Diverticulosis of colon 05/09/2015  . ED (erectile dysfunction) of organic origin 05/09/2015  . Family history of colon cancer 05/09/2015  . HLD  (hyperlipidemia) 05/09/2015  . Adult hypothyroidism 05/09/2015  . Adiposity 05/09/2015  . Arthritis, degenerative 05/09/2015  . Parathyroid adenoma 05/09/2015  . Apnea, sleep 05/09/2015  . Disorder of bursae and tendons in shoulder region 12/22/2014  . Hypertensive left ventricular hypertrophy 11/24/2014  . MI (mitral incompetence) 11/24/2014  . Obstructive apnea 11/24/2014    Social History   Social History  . Marital status: Married    Spouse name: N/A  . Number of children: N/A  . Years of education: N/A   Occupational History  . Not on file.   Social History Main Topics  . Smoking status: Former Smoker    Quit date: 07/04/1979  . Smokeless tobacco: Former Systems developer    Quit date: 11/01/1979  . Alcohol use No  . Drug use: No  . Sexual activity: Not on file   Other Topics Concern  . Not on file   Social History Narrative  . No narrative on file    Past Surgical History:  Procedure Laterality Date  . CYST REMOVAL NECK    . KNEE ARTHROSCOPY Right 2003, 2005   x2  . KNEE ARTHROSCOPY Left 2013  . nasal polyps  1978  . TIBIA FRACTURE SURGERY Left 2012  . TONSILLECTOMY AND ADENOIDECTOMY    . uvuloplasty  2000    His family history includes Anemia in his mother; Colon cancer (age of onset: 44) in his brother; Colon cancer (age of onset: 73) in his maternal aunt and maternal aunt; Diabetes in his brother; Hyperlipidemia in his brother; Hypertension in his brother; Kidney disease in his mother.     Outpatient Medications Prior to Visit  Medication Sig Dispense  Refill  . amLODipine (NORVASC) 10 MG tablet TAKE 1 TABLET EVERY DAY 90 tablet 3  . aspirin EC 81 MG tablet Take by mouth.    Marland Kitchen atorvastatin (LIPITOR) 40 MG tablet TAKE 1 TABLET EVERY DAY 90 tablet 3  . Canagliflozin-Metformin HCl (INVOKAMET) [phone] MG TABS Take 1 tablet by mouth 2 (two) times daily. 180 tablet 3  . carvedilol (COREG) 6.25 MG tablet TAKE 1 TABLET TWICE DAILY 180 tablet 3  . Cholecalciferol (VITAMIN  D3) 1000 UNITS CAPS Take 1 capsule by mouth daily.    Marland Kitchen EPINEPHRINE, ANAPHYLAXIS THERAPY AGENTS, EPIPEN 2-PAK, 0.'3MG'$ /0.3ML (Injection Device) - Historical Medication  as directed (0.3 MG/0.3ML) Active    . hydrALAZINE (APRESOLINE) 100 MG tablet TAKE 1 TABLET TWICE DAILY 180 tablet 3  . levothyroxine (SYNTHROID, LEVOTHROID) 88 MCG tablet TAKE 1 TABLET EVERY DAY 90 tablet 3  . pioglitazone (ACTOS) 30 MG tablet Take 1 tablet (30 mg total) by mouth daily. 90 tablet 3   No facility-administered medications prior to visit.     Allergies  Allergen Reactions  . Ace Inhibitors Anaphylaxis and Other (See Comments)    Patient Care Team: Jerrol Banana., MD as PCP - General (Family Medicine) Eulogio Bear, MD as Consulting Physician (Ophthalmology)   Objective:   Vitals:  Vitals:   10/18/16 0832  BP: (!) 142/60  Pulse: (!) 58  Resp: 14  Temp: 97.9 F (36.6 C)  TempSrc: Oral  Weight: 261 lb (118.4 kg)  Height: '5\' 9"'$  (1.753 m)    Physical Exam  Constitutional: He is oriented to person, place, and time. He appears well-developed and well-nourished.  HENT:  Head: Normocephalic and atraumatic.  Right Ear: External ear normal.  Left Ear: External ear normal.  Nose: Nose normal.  Mouth/Throat: Oropharynx is clear and moist.  Eyes: Conjunctivae and EOM are normal. Pupils are equal, round, and reactive to light.  Neck: Normal range of motion. Neck supple.  Cardiovascular: Normal rate, regular rhythm, normal heart sounds and intact distal pulses.   Pulmonary/Chest: Effort normal and breath sounds normal.  Abdominal: Soft. Bowel sounds are normal.  Musculoskeletal: Normal range of motion. He exhibits edema.  1+ edema of LE.  Neurological: He is alert and oriented to person, place, and time. He has normal reflexes.  Skin: Skin is warm and dry.  AK's on side of face, left greater than right.  Psychiatric: He has a normal mood and affect. His behavior is normal. Judgment and  thought content normal.    Activities of Daily Living In your present state of health, do you have any difficulty performing the following activities: 10/18/2016  Hearing? Y  Vision? Y  Difficulty concentrating or making decisions? N  Walking or climbing stairs? N  Dressing or bathing? N  Doing errands, shopping? N  Some recent data might be hidden    Fall Risk Assessment Fall Risk  10/18/2016 10/08/2015 06/08/2015  Falls in the past year? No No No     Depression Screen PHQ 2/9 Scores 10/18/2016 10/08/2015 06/08/2015  PHQ - 2 Score 0 0 0    Cognitive Testing - 6-CIT    Year: 0 points  Month: 0 points  Memorize "Pia Mau, [address] [place]"  Time (within 1 hour:) 0 points  Count backwards from 20: 0 points  Name months of year: 0 points  Repeat Address: 0 points   Total Score: 0/28  Interpretation : Normal (0-7) Abnormal (8-28)    Assessment & Plan:  Annual Wellness Visit  Reviewed patient's Family Medical History Reviewed and updated list of patient's medical providers Assessment of cognitive impairment was done Assessed patient's functional ability Established a written schedule for health screening [place] Completed and Reviewed  Exercise Activities and Dietary recommendations Goals    None      Immunization History  Administered Date(s) Administered  . Influenza-Unspecified 09/07/2015  . Pneumococcal Conjugate-13 06/16/2014  . Pneumococcal Polysaccharide-23 06/18/2012  . Td 11/21/2003, 01/24/2015  . Zoster 06/18/2012    Health Maintenance  Topic Date Due  . FOOT EXAM  08/08/1952  . URINE MICROALBUMIN  08/08/1952  . HEMOGLOBIN A1C  12/08/2016  . OPHTHALMOLOGY EXAM  01/17/2017  . COLONOSCOPY  08/15/2019  . TETANUS/TDAP  01/23/2025  . INFLUENZA VACCINE  Addressed  . ZOSTAVAX  Completed  . PNA vac Low Risk Adult  Completed   1. Medicare annual wellness visit, subsequent Regular exercise encouraged.  2. Essential  hypertension   3. Diabetic polyneuropathy associated with type 2 diabetes mellitus (HCC) Good control. Goal A1c of less than 7 and this 75 year old man. - POCT UA - Microalbumin is 20. - POCT HgB A1C--6.6 today.  4. AK (actinic keratosis)  - Ambulatory referral to Dermatology 5.Shoulder arthropathy May need referral to orthopedics. 6. Obesity Dietary changes discussed. 7. Tinnitus No dizziness or vertigo. May need ENT referral in the future. 8. Osteoarthritis 9. Diabetic nephropathy Microalbumin is low but will try to protect renal function. Consider losartan Discussed health benefits of physical activity, and encouraged him to engage in regular exercise appropriate for his age and condition.   I have done the exam and reviewed the above chart and it is accurate to the best of my knowledge. Development worker, community has been used in this note in any air is in the dictation or transcription are unintentional.  [facility] 10/18/2016 8:37 AM  ------------------------------------------------------------------------------------------------------------

## 2016-10-29 IMAGING — US US SOFT TISSUE HEAD/NECK
1 series · 14 of 25 positions shown · non-contrast
Comparison: None.

CLINICAL DATA: 74-year-old male with history of hypercalcemia

EXAM:
THYROID ULTRASOUND
TECHNIQUE: Ultrasound examination of the thyroid gland and adjacent soft
tissues was performed.

[Series 1: us soft tissue head/neck · 0.08mm/px · 14 of 42 slices shown]
[im 1/42]
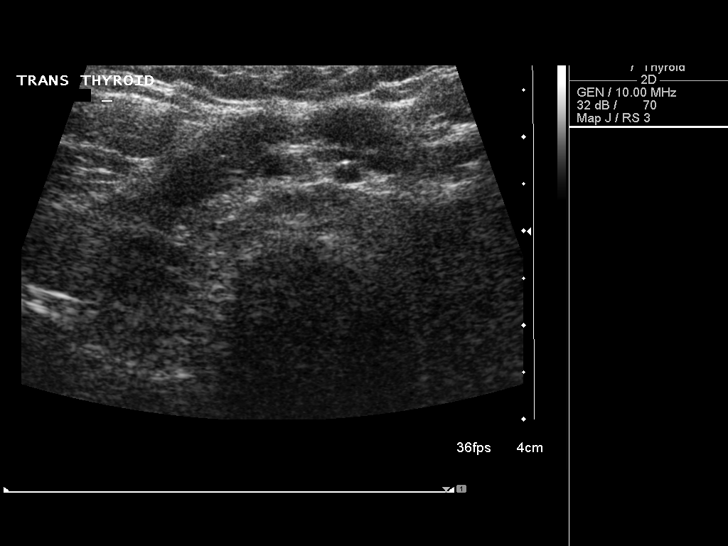
[im 4/42]
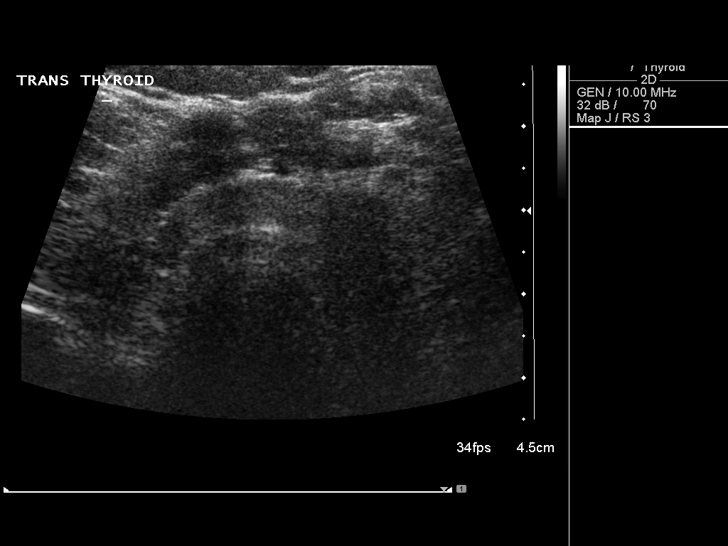
[im 7/42]
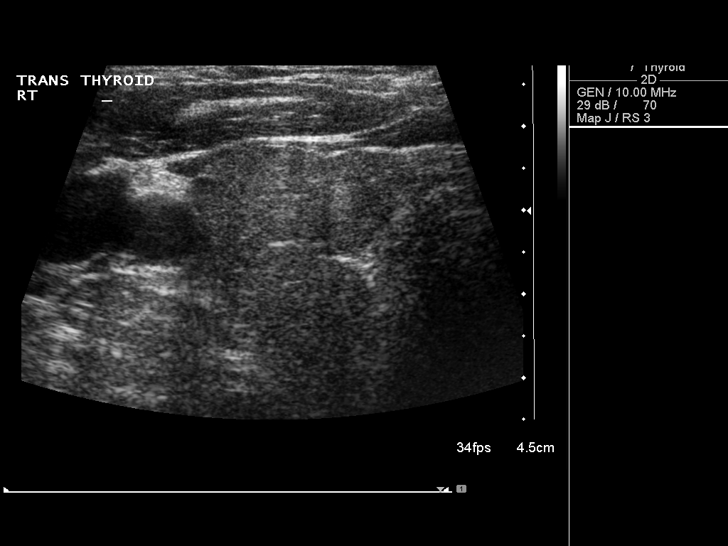
[im 11/42]
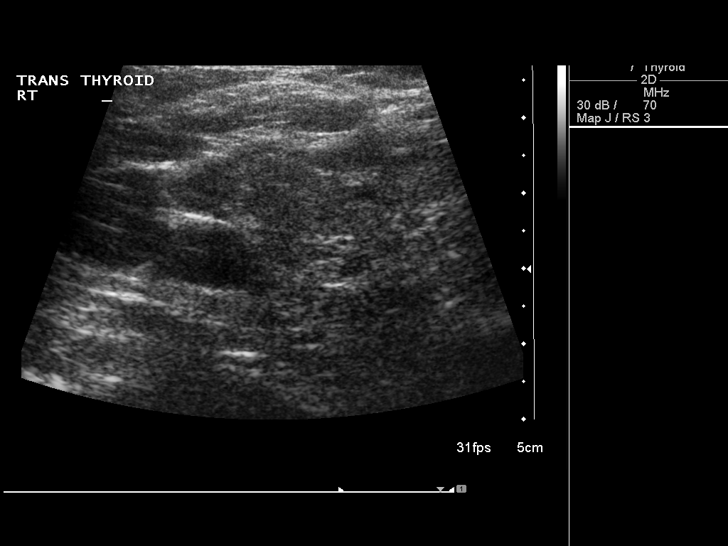
[im 14/42]
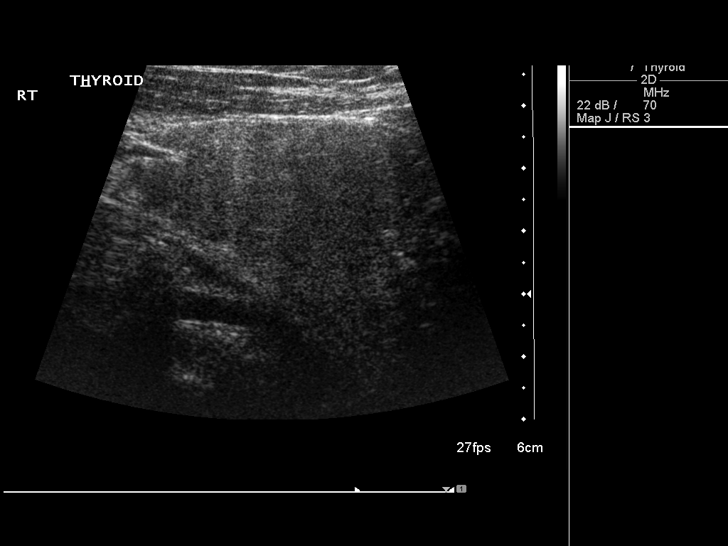
[im 16/42]
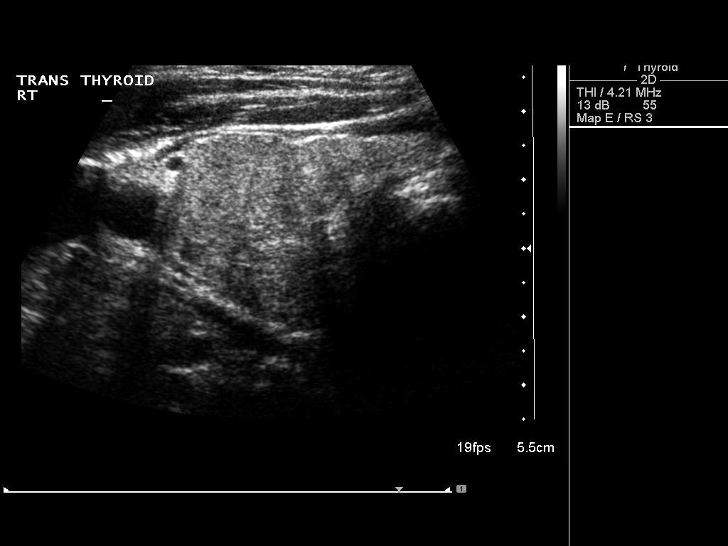
[im 19/42]
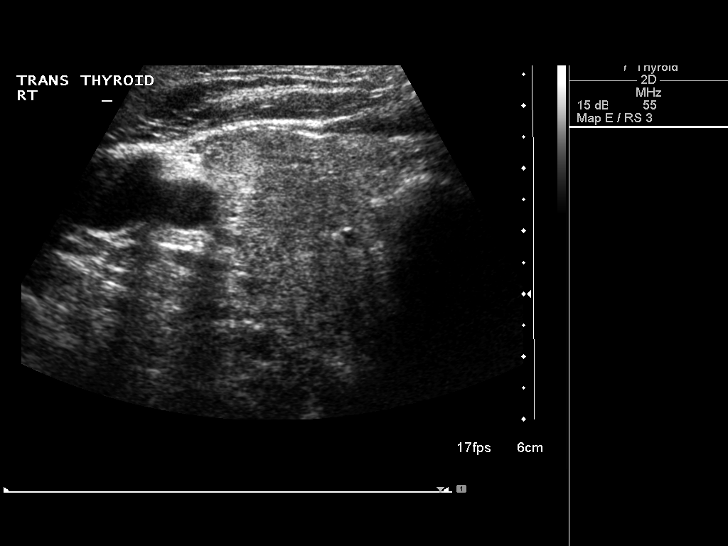
[im 23/42]
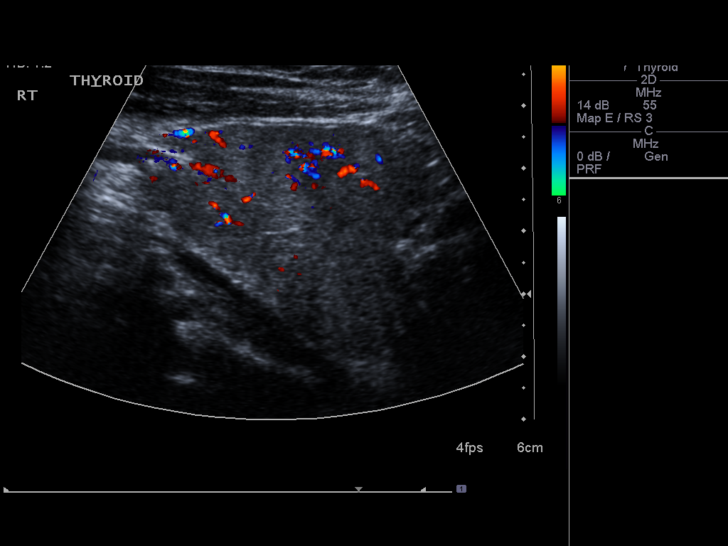
[im 26/42]
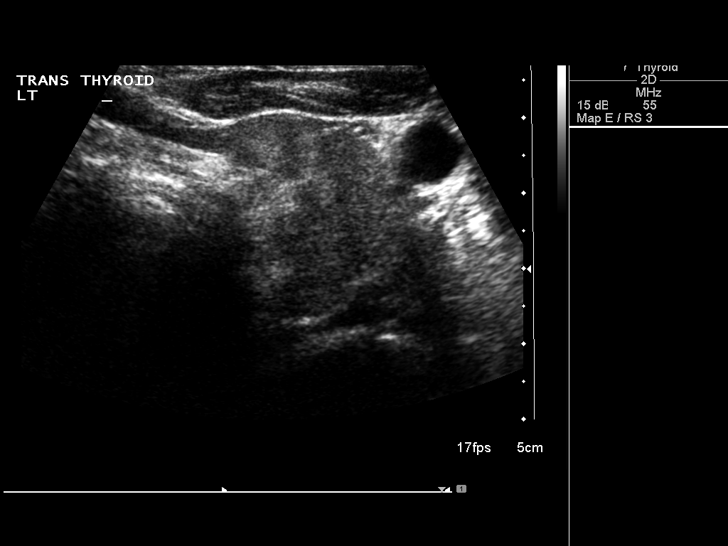
[im 28/42]
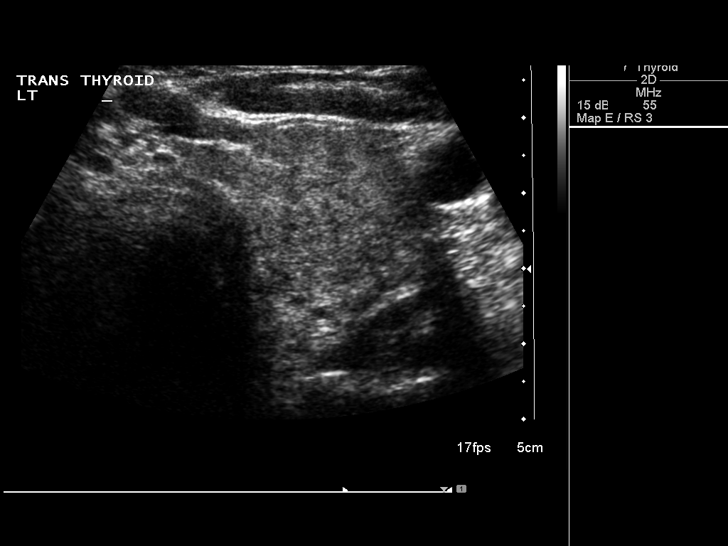
[im 31/42]
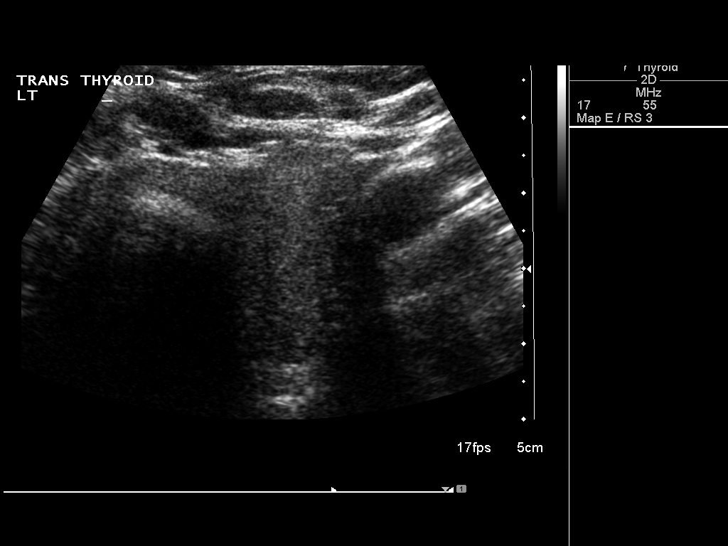
[im 35/42]
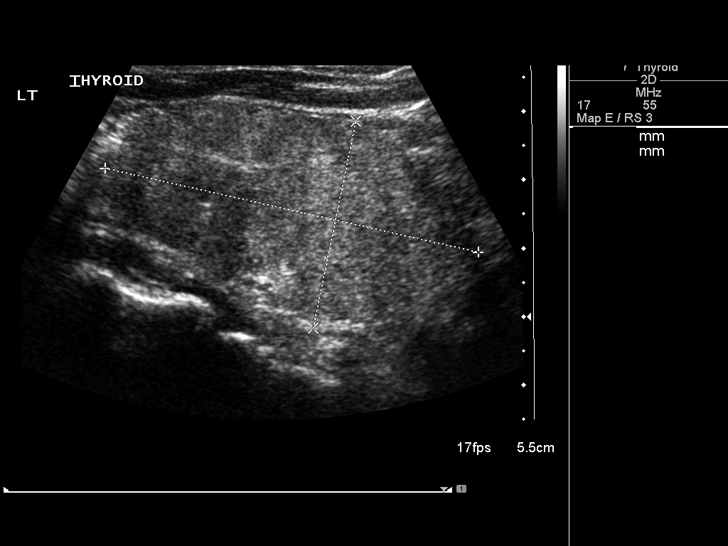
[im 38/42]
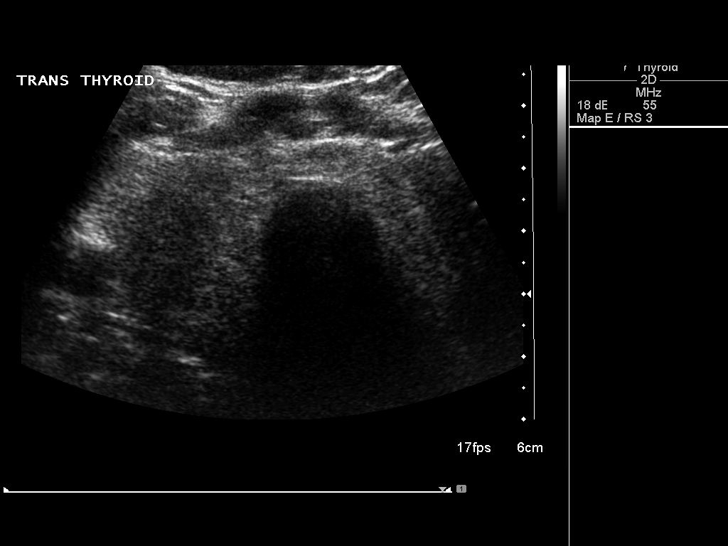
[im 42/42]
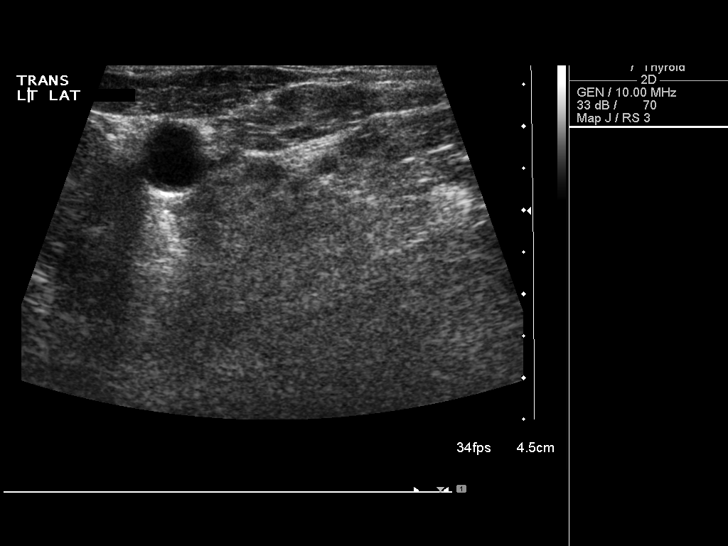

[14 of 25 positions shown; findings below may reference images not displayed]

FINDINGS: Right thyroid lobe

Measurements: 4.9 cm x 3.7 cm x 3.4 cm. Heterogeneous appearance of
right thyroid tissue without significant increased internal flow.

Left thyroid lobe

Measurements: 5.6 cm x 3.1 cm x 2.5 cm. Heterogeneous appearance of
left-sided thyroid tissue without significantly increased internal
flow.

Isthmus

Thickness: 7 mm.  No nodules visualized.

Lymphadenopathy

None visualized.

No separate nodules identified.
IMPRESSION: Heterogeneous enlarged thyroid, compatible with spectrum of
thyroiditis.

## 2016-11-01 ENCOUNTER — Ambulatory Visit: Payer: Medicare Other | Admitting: Anesthesiology

## 2016-11-01 ENCOUNTER — Ambulatory Visit
Admission: RE | Admit: 2016-11-01 | Discharge: 2016-11-01 | Disposition: A | Discharge status: Home / Self Care | Payer: Medicare Other | Source: Ambulatory Visit | Attending: Ophthalmology | Admitting: Ophthalmology

## 2016-11-01 ENCOUNTER — Encounter
Admission: RE | Disposition: A | Discharge status: Home / Self Care | Payer: Self-pay | Source: Ambulatory Visit | Attending: Ophthalmology

## 2016-11-01 DIAGNOSIS — J449 Chronic obstructive pulmonary disease, unspecified: Secondary | ICD-10-CM | POA: Insufficient documentation

## 2016-11-01 DIAGNOSIS — E039 Hypothyroidism, unspecified: Secondary | ICD-10-CM | POA: Insufficient documentation

## 2016-11-01 DIAGNOSIS — I10 Essential (primary) hypertension: Secondary | ICD-10-CM | POA: Insufficient documentation

## 2016-11-01 DIAGNOSIS — E785 Hyperlipidemia, unspecified: Secondary | ICD-10-CM | POA: Diagnosis not present

## 2016-11-01 DIAGNOSIS — I2510 Atherosclerotic heart disease of native coronary artery without angina pectoris: Secondary | ICD-10-CM | POA: Diagnosis not present

## 2016-11-01 DIAGNOSIS — E1136 Type 2 diabetes mellitus with diabetic cataract: Secondary | ICD-10-CM | POA: Insufficient documentation

## 2016-11-01 DIAGNOSIS — Z87891 Personal history of nicotine dependence: Secondary | ICD-10-CM | POA: Insufficient documentation

## 2016-11-01 DIAGNOSIS — G4730 Sleep apnea, unspecified: Secondary | ICD-10-CM | POA: Diagnosis not present

## 2016-11-01 DIAGNOSIS — Z955 Presence of coronary angioplasty implant and graft: Secondary | ICD-10-CM | POA: Insufficient documentation

## 2016-11-01 DIAGNOSIS — H2511 Age-related nuclear cataract, right eye: Secondary | ICD-10-CM | POA: Diagnosis not present

## 2016-11-01 HISTORY — PX: CATARACT EXTRACTION W/PHACO: SHX586

## 2016-11-01 LAB — GLUCOSE, CAPILLARY: GLUCOSE-CAPILLARY: 210 mg/dL — AB (ref 65–99)

## 2016-11-01 SURGERY — PHACOEMULSIFICATION, CATARACT, WITH IOL INSERTION
Anesthesia: Monitor Anesthesia Care | Site: Eye | Laterality: Right | Wound class: Clean

## 2016-11-01 MED ORDER — NEOMYCIN-POLYMYXIN-DEXAMETH 3.5-10000-0.1 OP OINT
TOPICAL_OINTMENT | OPHTHALMIC | Status: AC
  Filled 2016-11-01: qty 3.5

## 2016-11-01 MED ORDER — HYALURONIDASE HUMAN 150 UNIT/ML IJ SOLN
INTRAMUSCULAR | Status: AC
  Filled 2016-11-01: qty 1

## 2016-11-01 MED ORDER — MIDAZOLAM HCL 2 MG/2ML IJ SOLN
INTRAMUSCULAR | Status: AC
  Filled 2016-11-01: qty 2

## 2016-11-01 MED ORDER — ARMC OPHTHALMIC DILATING DROPS
1.0000 "application " | OPHTHALMIC | Status: AC
  Administered 2016-11-01 (×3): 1 via OPHTHALMIC

## 2016-11-01 MED ORDER — FENTANYL CITRATE (PF) 100 MCG/2ML IJ SOLN
INTRAMUSCULAR | Status: DC | PRN
  Administered 2016-11-01 (×2): 50 ug via INTRAVENOUS

## 2016-11-01 MED ORDER — LIDOCAINE HCL (PF) 4 % IJ SOLN
INTRAOCULAR | Status: DC | PRN
  Administered 2016-11-01: 4 mL via OPHTHALMIC

## 2016-11-01 MED ORDER — BUPIVACAINE HCL (PF) 0.75 % IJ SOLN
INTRAMUSCULAR | Status: AC
  Filled 2016-11-01: qty 10

## 2016-11-01 MED ORDER — MIDAZOLAM HCL 2 MG/2ML IJ SOLN
INTRAMUSCULAR | Status: DC | PRN
  Administered 2016-11-01 (×2): 1 mg via INTRAVENOUS

## 2016-11-01 MED ORDER — MOXIFLOXACIN HCL 0.5 % OP SOLN
1.0000 [drp] | OPHTHALMIC | Status: AC
  Administered 2016-11-01 (×3): 1 [drp] via OPHTHALMIC

## 2016-11-01 MED ORDER — ARMC OPHTHALMIC DILATING DROPS
OPHTHALMIC | Status: AC
  Administered 2016-11-01: 1 via OPHTHALMIC
  Filled 2016-11-01: qty 0.4

## 2016-11-01 MED ORDER — EPINEPHRINE PF 1 MG/ML IJ SOLN
INTRAMUSCULAR | Status: AC
  Filled 2016-11-01: qty 1

## 2016-11-01 MED ORDER — MOXIFLOXACIN HCL 0.5 % OP SOLN
OPHTHALMIC | Status: AC
  Administered 2016-11-01: 1 [drp] via OPHTHALMIC
  Filled 2016-11-01: qty 3

## 2016-11-01 MED ORDER — SODIUM CHLORIDE 0.9 % IV SOLN
INTRAVENOUS | Status: DC
  Administered 2016-11-01 (×2): via INTRAVENOUS

## 2016-11-01 MED ORDER — CEFUROXIME OPHTHALMIC INJECTION 1 MG/0.1 ML
INJECTION | OPHTHALMIC | Status: AC
  Filled 2016-11-01: qty 0.1

## 2016-11-01 MED ORDER — FENTANYL CITRATE (PF) 100 MCG/2ML IJ SOLN
INTRAMUSCULAR | Status: AC
  Filled 2016-11-01: qty 2

## 2016-11-01 MED ORDER — LIDOCAINE HCL (PF) 4 % IJ SOLN
INTRAMUSCULAR | Status: AC
  Filled 2016-11-01: qty 5

## 2016-11-01 MED ORDER — NA CHONDROIT SULF-NA HYALURON 40-17 MG/ML IO SOLN
INTRAOCULAR | Status: DC | PRN
  Administered 2016-11-01: 1 mL via INTRAOCULAR

## 2016-11-01 MED ORDER — NA CHONDROIT SULF-NA HYALURON 40-17 MG/ML IO SOLN
INTRAOCULAR | Status: AC
  Filled 2016-11-01: qty 1

## 2016-11-01 MED ORDER — MOXIFLOXACIN HCL 0.5 % OP SOLN
OPHTHALMIC | Status: DC | PRN
  Administered 2016-11-01: 9 [drp] via OPHTHALMIC

## 2016-11-01 MED ORDER — EPINEPHRINE PF 1 MG/ML IJ SOLN
INTRAMUSCULAR | Status: DC | PRN
  Administered 2016-11-01: 200 mL via OPHTHALMIC

## 2016-11-01 SURGICAL SUPPLY — 21 items
CANNULA ANT/CHMB 27GA (MISCELLANEOUS) ×3 IMPLANT
CUP MEDICINE 2OZ PLAST GRAD ST (MISCELLANEOUS) ×3 IMPLANT
GLOVE BIO SURGEON STRL SZ8 (GLOVE) ×3 IMPLANT
GLOVE BIOGEL M 6.5 STRL (GLOVE) ×3 IMPLANT
GLOVE SURG LX 8.0 MICRO (GLOVE) ×2
GLOVE SURG LX STRL 8.0 MICRO (GLOVE) ×1 IMPLANT
GOWN STRL REUS W/ TWL LRG LVL3 (GOWN DISPOSABLE) ×2 IMPLANT
GOWN STRL REUS W/TWL LRG LVL3 (GOWN DISPOSABLE) ×4
LENS IOL TECNIS ITEC 21.5 (Intraocular Lens) ×3 IMPLANT
PACK CATARACT (MISCELLANEOUS) ×3 IMPLANT
PACK CATARACT BRASINGTON LX (MISCELLANEOUS) ×3 IMPLANT
PACK EYE AFTER SURG (MISCELLANEOUS) ×3 IMPLANT
SOL BSS BAG (MISCELLANEOUS) ×3
SOL PREP PVP 2OZ (MISCELLANEOUS) ×3
SOLUTION BSS BAG (MISCELLANEOUS) ×1 IMPLANT
SOLUTION PREP PVP 2OZ (MISCELLANEOUS) ×1 IMPLANT
SYR 3ML LL SCALE MARK (SYRINGE) ×3 IMPLANT
SYR 5ML LL (SYRINGE) ×3 IMPLANT
SYR TB 1ML 27GX1/2 LL (SYRINGE) ×3 IMPLANT
WATER STERILE IRR 250ML POUR (IV SOLUTION) ×3 IMPLANT
WIPE NON LINTING 3.25X3.25 (MISCELLANEOUS) ×3 IMPLANT

## 2016-11-01 NOTE — Transfer of Care (Signed)
Immediate Anesthesia Transfer of Care Note  Patient: Zachary Martinez  Procedure(s) Performed: Procedure(s) with comments: CATARACT EXTRACTION PHACO AND INTRAOCULAR LENS PLACEMENT ([place]) (Right) - Lot #[id number] H [place]: oo:35.4 AP%; 22.4 CDE: 7.91  Patient Location: PACU and Short Stay  Anesthesia Type:MAC  Level of Consciousness: awake and alert   Airway & Oxygen Therapy: Patient Spontanous Breathing  Post-op Assessment:  Post vital signs: Reviewed and stable  Last Vitals:  Vitals:   11/01/16 0701  BP: (!) 162/66  Pulse: (!) 55  Resp: 16  Temp: 36.4 C    Last Pain:  Vitals:   11/01/16 0701  TempSrc: Oral         Complications: No apparent anesthesia complications

## 2016-11-01 NOTE — Anesthesia Preprocedure Evaluation (Signed)
Anesthesia Evaluation  Patient identified by MRN, date of birth, ID band Patient awake    Reviewed: Allergy & Precautions, H&P , NPO status , Patient's Chart, lab work & pertinent test results, reviewed documented beta blocker date and time   Airway Mallampati: III  TM Distance: >3 FB Neck ROM: full    Dental  (+) Caps   Pulmonary neg shortness of breath, sleep apnea and Continuous Positive Airway Pressure Ventilation , neg COPD, Recent URI , Resolved, former smoker,    Pulmonary exam normal breath sounds clear to auscultation       Cardiovascular Exercise Tolerance: Good hypertension, (-) angina+ CAD  (-) Past MI, (-) Cardiac Stents and (-) CABG Normal cardiovascular exam(-) dysrhythmias (-) Valvular Problems/Murmurs Rhythm:regular Rate:Normal     Neuro/Psych neg Seizures  Neuromuscular disease negative psych ROS   GI/Hepatic negative GI ROS, Neg liver ROS,   Endo/Other  diabetesHypothyroidism   Renal/GU negative Renal ROS  negative genitourinary   Musculoskeletal   Abdominal   Peds  Hematology negative hematology ROS (+)   Anesthesia Other Findings Past Medical History: No date: CAD (coronary artery disease) No date: Diabetes (HCC) No date: Hyperlipidemia No date: Hypertension No date: Hypothyroidism No date: Sleep apnea     Comment: cpap   Reproductive/Obstetrics negative OB ROS                             Anesthesia Physical Anesthesia Plan  ASA: III  Anesthesia Plan: MAC   Post-op Pain Management:    Induction:   Airway Management Planned:   Additional Equipment:   Intra-op Plan:   Post-operative Plan:   Informed Consent: I have reviewed the patients History and Physical, chart, labs and discussed the procedure including the risks, benefits and alternatives for the proposed anesthesia with the patient or authorized representative who has indicated his/her  understanding and acceptance.   Dental Advisory Given  Plan Discussed with: Anesthesiologist, CRNA and Surgeon  Anesthesia Plan Comments:         Anesthesia Quick Evaluation

## 2016-11-01 NOTE — Op Note (Signed)
PREOPERATIVE DIAGNOSIS:  Nuclear sclerotic cataract of the right eye.   POSTOPERATIVE DIAGNOSIS:  right nuclear sclerotic CATARACT   OPERATIVE PROCEDURE: Procedure(s): CATARACT EXTRACTION PHACO AND INTRAOCULAR LENS PLACEMENT ([facility])   SURGEON:  Birder Robson, MD.   ANESTHESIA:  Anesthesiologist: Martha Clan, MD CRNA: Marsh Dolly, CRNA  1.      Managed anesthesia care. 2.      0.32m of Shugarcaine was instilled in the eye following the paracentesis.   COMPLICATIONS:  None.   TECHNIQUE:   Stop and chop   DESCRIPTION OF PROCEDURE:  The patient was examined and consented in the preoperative holding area where the aforementioned topical anesthesia was applied to the right eye and then brought back to the Operating Room where the right eye was prepped and draped in the usual sterile ophthalmic fashion and a lid speculum was placed. A paracentesis was created with the side port blade and the anterior chamber was filled with viscoelastic. A near clear corneal incision was performed with the steel keratome. A continuous curvilinear capsulorrhexis was performed with a cystotome followed by the capsulorrhexis forceps. Hydrodissection and hydrodelineation were carried out with BSS on a blunt cannula. The lens was removed in a stop and chop  technique and the remaining cortical material was removed with the irrigation-aspiration handpiece. The capsular bag was inflated with viscoelastic and the Technis [id number]  lens was placed in the capsular bag without complication. The remaining viscoelastic was removed from the eye with the irrigation-aspiration handpiece. The wounds were hydrated. The anterior chamber was flushed with Miostat and the eye was inflated to physiologic pressure. 0.120mof Vigamox was placed in the anterior chamber. The wounds were found to be water tight. The eye was dressed with Vigamox. The patient was given protective glasses to wear throughout the day and a shield with which to sleep  tonight. The patient was also given drops with which to begin a drop regimen today and will follow-up with me in one day.  Implant Name Type Inv. Item Serial No. Manufacturer Lot No. LRB No. Used  LENS IOL DIOP 21.5 - [id number] Intraocular Lens LENS IOL DIOP 21.5 [id number] AMO   Right 1   Procedure(s) with comments: CATARACT EXTRACTION PHACO AND INTRAOCULAR LENS PLACEMENT ([facility]) (Right) - Lot #[id number] [place]:35.4 AP%; 22.4 CDE: 7.91  Electronically signed: POClifford/12/2016 9:00 AM

## 2016-11-01 NOTE — H&P (Signed)
All labs reviewed. Abnormal studies sent to patients PCP when indicated.  Previous H&P reviewed, patient examined, there are NO CHANGES.  Zachary Martinez LOUIS1/2/20188:23 AM

## 2016-11-01 NOTE — Discharge Instructions (Signed)
Eye Surgery Discharge Instructions  Expect mild scratchy sensation or mild soreness. DO NOT RUB YOUR EYE!  The day of surgery:  Minimal physical activity, but bed rest is not required  No reading, computer work, or close hand work  No bending, lifting, or straining.  May watch TV  For 24 hours:  No driving, legal decisions, or alcoholic beverages  Safety precautions  Eat anything you prefer: It is better to start with liquids, then soup then solid foods.  _____ Eye patch should be worn until postoperative exam tomorrow.  ____ Solar shield eyeglasses should be worn for comfort in the sunlight/patch while sleeping  Resume all regular medications including aspirin or Coumadin if these were discontinued prior to surgery. You may shower, bathe, shave, or wash your hair. Tylenol may be taken for mild discomfort.  Call your doctor if you experience significant pain, nausea, or vomiting, fever > 101 or other signs of infection. [phone] or [phone] Specific instructions:  Follow-up Information    PORFILIO,WILLIAM LOUIS, MD Follow up on 11/02/2016.   Specialty:  Ophthalmology Why:  AT 9:10 Contact information: [address] [place] [address] [phone]

## 2016-11-01 NOTE — Anesthesia Postprocedure Evaluation (Signed)
Anesthesia Post Note  Patient: Zachary Martinez  Procedure(s) Performed: Procedure(s) (LRB): CATARACT EXTRACTION PHACO AND INTRAOCULAR LENS PLACEMENT ([facility]) (Right)  Patient location during evaluation: PACU Anesthesia Type: MAC Level of consciousness: awake and alert Pain management: pain level controlled Vital Signs Assessment: post-procedure vital signs reviewed and stable Respiratory status: spontaneous breathing, nonlabored ventilation, respiratory function stable and patient connected to nasal cannula oxygen Cardiovascular status: stable and blood pressure returned to baseline Anesthetic complications: no     Last Vitals:  Vitals:   11/01/16 0857 11/01/16 0913  BP:  (!) 143/57  Pulse:  (!) 52  Resp:  16  Temp: 36.8 C     Last Pain:  Vitals:   11/01/16 0857  TempSrc: Temporal                 Martha Clan

## 2016-11-02 ENCOUNTER — Encounter: Payer: Self-pay | Admitting: Ophthalmology

## 2016-11-07 ENCOUNTER — Other Ambulatory Visit: Payer: Self-pay | Admitting: Family Medicine

## 2016-11-07 NOTE — Telephone Encounter (Signed)
Had wellness with you 10/18/2016. A1C was 6.6% at that time. Renaldo Fiddler, CMA

## 2016-12-07 DIAGNOSIS — D225 Melanocytic nevi of trunk: Secondary | ICD-10-CM | POA: Diagnosis not present

## 2016-12-07 DIAGNOSIS — C44519 Basal cell carcinoma of skin of other part of trunk: Secondary | ICD-10-CM | POA: Diagnosis not present

## 2016-12-07 DIAGNOSIS — D2261 Melanocytic nevi of right upper limb, including shoulder: Secondary | ICD-10-CM | POA: Diagnosis not present

## 2016-12-07 DIAGNOSIS — D2262 Melanocytic nevi of left upper limb, including shoulder: Secondary | ICD-10-CM | POA: Diagnosis not present

## 2016-12-07 DIAGNOSIS — L570 Actinic keratosis: Secondary | ICD-10-CM | POA: Diagnosis not present

## 2016-12-07 DIAGNOSIS — X32XXXA Exposure to sunlight, initial encounter: Secondary | ICD-10-CM | POA: Diagnosis not present

## 2016-12-07 DIAGNOSIS — D485 Neoplasm of uncertain behavior of skin: Secondary | ICD-10-CM | POA: Diagnosis not present

## 2017-01-11 DIAGNOSIS — H4311 Vitreous hemorrhage, right eye: Secondary | ICD-10-CM | POA: Diagnosis not present

## 2017-01-19 DIAGNOSIS — I10 Essential (primary) hypertension: Secondary | ICD-10-CM | POA: Diagnosis not present

## 2017-01-19 DIAGNOSIS — I2510 Atherosclerotic heart disease of native coronary artery without angina pectoris: Secondary | ICD-10-CM | POA: Diagnosis not present

## 2017-01-19 DIAGNOSIS — G4733 Obstructive sleep apnea (adult) (pediatric): Secondary | ICD-10-CM | POA: Diagnosis not present

## 2017-01-19 DIAGNOSIS — E782 Mixed hyperlipidemia: Secondary | ICD-10-CM | POA: Diagnosis not present

## 2017-01-20 DIAGNOSIS — H4311 Vitreous hemorrhage, right eye: Secondary | ICD-10-CM | POA: Diagnosis not present

## 2017-01-24 DIAGNOSIS — C44519 Basal cell carcinoma of skin of other part of trunk: Secondary | ICD-10-CM | POA: Diagnosis not present

## 2017-02-09 ENCOUNTER — Ambulatory Visit (INDEPENDENT_AMBULATORY_CARE_PROVIDER_SITE_OTHER): Payer: Medicare Other | Admitting: Family Medicine

## 2017-02-09 VITALS — BP 136/64 | HR 62 | Resp 14 | Wt 254.0 lb

## 2017-02-09 DIAGNOSIS — I878 Other specified disorders of veins: Secondary | ICD-10-CM | POA: Diagnosis not present

## 2017-02-09 DIAGNOSIS — S40811A Abrasion of right upper arm, initial encounter: Secondary | ICD-10-CM | POA: Diagnosis not present

## 2017-02-09 DIAGNOSIS — E1142 Type 2 diabetes mellitus with diabetic polyneuropathy: Secondary | ICD-10-CM | POA: Diagnosis not present

## 2017-02-09 DIAGNOSIS — E7849 Other hyperlipidemia: Secondary | ICD-10-CM

## 2017-02-09 DIAGNOSIS — E784 Other hyperlipidemia: Secondary | ICD-10-CM | POA: Diagnosis not present

## 2017-02-09 DIAGNOSIS — I10 Essential (primary) hypertension: Secondary | ICD-10-CM | POA: Diagnosis not present

## 2017-02-09 DIAGNOSIS — E038 Other specified hypothyroidism: Secondary | ICD-10-CM

## 2017-02-09 DIAGNOSIS — M1990 Unspecified osteoarthritis, unspecified site: Secondary | ICD-10-CM

## 2017-02-09 DIAGNOSIS — I2510 Atherosclerotic heart disease of native coronary artery without angina pectoris: Secondary | ICD-10-CM | POA: Diagnosis not present

## 2017-02-09 LAB — POCT GLYCOSYLATED HEMOGLOBIN (HGB A1C): Hemoglobin A1C: 7.1

## 2017-02-09 NOTE — Progress Notes (Signed)
Zachary Martinez  MRN: [id number] DOB: 1941-07-13  Subjective:  HPI  Patient is here for 4 months follow up. Diabetes: patient checks his sugar in the morning fasting and it has been around 123 lately. No hypoglycemic episodes. No numbness or tingling sensation present. Lab Results  Component Value Date   HGBA1C 7.1 02/09/2017   B/P: has not been checking his b/p lately. No cardiac symptoms present. Patient has seen Dr Nehemiah Massed and was told to increase Hydralazine to 3 times daily and to stop Aspirin. BP Readings from Last 3 Encounters:  02/09/17 136/64  11/01/16 (!) 143/57  10/18/16 (!) 142/60   Patient would like to have his left lower leg/ankle looked at. This area stays red. [place] the [place] skin there also felt a little irritated/sensitive to the touch but is better now.   Also wanted to mention an eye issue he had again. In 2017 in March he developed blood vessel bleed in his right eye all of a sudden that ophthalmologist followed patient for and then in in August 2017 eye surgeon in [place] removed the blood from the eye since it was still there. IN January 2018 he had cataract removed in his right eye. Then in March about 6 weeks ago he got up in the morning and went to take a shower and then noticed his right eye had bleed again. The next morning had cloudy vision in that eye because when he had surgery for this in August surgeon removed the gel part in that eye where the blood was contained and added fluid so this time the blood was more spread out in the fluid causing the cloudy vision. He has followed up with the same surgeon again and has follow up with him again next Friday 02/17/17 again. Patient states the cloudiness is getting better and he can see better.  Last labs were done on 06/08/16 and they were routine labs, stable. Patient Active Problem List   Diagnosis Date Noted  . Hypertension 06/07/2016  . Type 2 diabetes mellitus ([place]) 10/08/2015  . Abnormal chest  x-ray 05/09/2015  . CAD in native artery 05/09/2015  . Benign fibroma of prostate 05/09/2015  . Arteriosclerosis of coronary artery 05/09/2015  . Diabetes mellitus with polyneuropathy ([place]) 05/09/2015  . Diverticulosis of colon 05/09/2015  . ED (erectile dysfunction) of organic origin 05/09/2015  . Family history of colon cancer 05/09/2015  . HLD (hyperlipidemia) 05/09/2015  . Adult hypothyroidism 05/09/2015  . Adiposity 05/09/2015  . Arthritis, degenerative 05/09/2015  . Parathyroid adenoma 05/09/2015  . Apnea, sleep 05/09/2015  . Disorder of bursae and tendons in shoulder region 12/22/2014  . Hypertensive left ventricular hypertrophy 11/24/2014  . MI (mitral incompetence) 11/24/2014  . Obstructive apnea 11/24/2014    Past Medical History:  Diagnosis Date  . CAD (coronary artery disease)   . Diabetes ([place])   . Hyperlipidemia   . Hypertension   . Hypothyroidism   . Sleep apnea    cpap    Social History   Social History  . Marital status: Married    Spouse name: N/A  . Number of children: N/A  . Years of education: N/A   Occupational History  . Not on file.   Social History Main Topics  . Smoking status: Former Smoker    Quit date: 07/04/1979  . Smokeless tobacco: Former Systems developer    Quit date: 11/01/1979  . Alcohol use No  . Drug use: No  . Sexual activity: Not  on file   Other Topics Concern  . Not on file   Social History Narrative  . No narrative on file    Outpatient Encounter Prescriptions as of 02/09/2017  Medication Sig  . amLODipine (NORVASC) 10 MG tablet TAKE 1 TABLET EVERY DAY  . atorvastatin (LIPITOR) 40 MG tablet TAKE 1 TABLET EVERY DAY (Patient taking differently: TAKE 1 TABLET EVERY EVENING)  . carvedilol (COREG) 6.25 MG tablet TAKE 1 TABLET TWICE DAILY  . Cholecalciferol (VITAMIN D3) 1000 UNITS CAPS Take 2,000 Units by mouth daily.   Marland Kitchen EPINEPHRINE, ANAPHYLAXIS THERAPY AGENTS, EPIPEN 2-PAK, 0.'3MG'$ /0.3ML (Injection Device) - Historical Medication  as  directed (0.3 MG/0.3ML) Active  . hydrALAZINE (APRESOLINE) 100 MG tablet Take 1 tablet by mouth 3 (three) times daily.  . INVOKAMET [phone] MG TABS TAKE 1 TABLET TWICE DAILY  . levothyroxine (SYNTHROID, LEVOTHROID) 88 MCG tablet TAKE 1 TABLET EVERY DAY  . pioglitazone (ACTOS) 30 MG tablet Take 1 tablet (30 mg total) by mouth daily.  . [DISCONTINUED] hydrALAZINE (APRESOLINE) 100 MG tablet TAKE 1 TABLET TWICE DAILY (Patient taking differently: TAKE 1 TABLET Three times DAILY)  . [DISCONTINUED] aspirin EC 81 MG tablet Take 81 mg by mouth daily.    No facility-administered encounter medications on file as of 02/09/2017.     Allergies  Allergen Reactions  . Ace Inhibitors Anaphylaxis and Other (See Comments)    Review of Systems  Constitutional: Negative.   HENT: Negative.   Eyes: Negative.        Cloudy vision in the right eye-improving, due to bleeding in that eye   Respiratory: Negative.   Cardiovascular: Positive for leg swelling. Negative for chest pain, palpitations and orthopnea.  Gastrointestinal: Negative.   Musculoskeletal: Positive for joint pain (knees, cracking sound present.).  Skin: Negative.        Red skin-left ankle. Abrasion present near right elbow from injury.  Neurological: Negative.   Endo/Heme/Allergies: Negative.   Psychiatric/Behavioral: Negative.     Objective:  BP 136/64   Pulse 62   Resp 14   Wt 254 lb (115.2 kg)   BMI 37.51 kg/m   Physical Exam  Constitutional: He is oriented to person, place, and time and well-developed, well-nourished, and in no distress.  HENT:  Head: Normocephalic and atraumatic.  Eyes: Conjunctivae are normal. Pupils are equal, round, and reactive to light.  Neck: Normal range of motion. Neck supple.  Cardiovascular: Normal rate, regular rhythm, normal heart sounds and intact distal pulses.   No murmur heard. Pulmonary/Chest: Effort normal and breath sounds normal. No respiratory distress. He has no wheezes.    Musculoskeletal: He exhibits edema (1+).  Neurological: He is alert and oriented to person, place, and time.  Skin:  Venous stasis changes in the lower left leg.  Psychiatric: Mood, memory, affect and judgment normal.   Diabetic Foot Exam - Simple   Simple Foot Form Diabetic Foot exam was performed with the following findings:  Yes 02/09/2017  8:47 AM  Visual Inspection No deformities, no ulcerations, no other skin breakdown bilaterally:  Yes Sensation Testing Intact to touch and monofilament testing bilaterally:  Yes Pulse Check Posterior Tibialis and Dorsalis pulse intact bilaterally:  Yes Comments     Assessment and Plan :  \1. Essential hypertension Stable. Follow current medication.  2. Diabetic polyneuropathy associated with type 2 diabetes mellitus (HCC) A1C 7.1. Worse. Will let patient work on his habits. - POCT HgB A1C  3. Arthritis  4. Other hyperlipidemia 5. Other specified hypothyroidism  6. CAD in native artery Stable. Sees Dr Nehemiah Massed for follow up.  7. Venous stasis of lower extremity Stable at this time. Advised patient if this area gets hot to the touch, pain present or any drainage we need to see patient at that time to re check.  8. Abrasion of right upper extremity, initial encounter Not infected at this time. Explained to patient how to take care of this area and to follow as needed unless there is an issue. I have done the exam and reviewed the chart and it is accurate to the best of my knowledge. Development worker, community has been used and  any errors in dictation or transcription are unintentional. Miguel Aschoff M.D. [facility]  HPI, Exam and A&P transcribed by Theressa Millard, RMA under direction and in the presence of Miguel Aschoff, MD.

## 2017-03-31 ENCOUNTER — Other Ambulatory Visit: Payer: Self-pay | Admitting: Family Medicine

## 2017-04-07 ENCOUNTER — Other Ambulatory Visit: Payer: Self-pay | Admitting: Family Medicine

## 2017-04-24 DIAGNOSIS — M25561 Pain in right knee: Secondary | ICD-10-CM | POA: Diagnosis not present

## 2017-04-24 DIAGNOSIS — M1711 Unilateral primary osteoarthritis, right knee: Secondary | ICD-10-CM | POA: Diagnosis not present

## 2017-06-02 DIAGNOSIS — G4733 Obstructive sleep apnea (adult) (pediatric): Secondary | ICD-10-CM | POA: Diagnosis not present

## 2017-06-02 DIAGNOSIS — I10 Essential (primary) hypertension: Secondary | ICD-10-CM | POA: Diagnosis not present

## 2017-06-02 DIAGNOSIS — I2510 Atherosclerotic heart disease of native coronary artery without angina pectoris: Secondary | ICD-10-CM | POA: Diagnosis not present

## 2017-06-02 DIAGNOSIS — I872 Venous insufficiency (chronic) (peripheral): Secondary | ICD-10-CM | POA: Insufficient documentation

## 2017-06-05 DIAGNOSIS — H4311 Vitreous hemorrhage, right eye: Secondary | ICD-10-CM | POA: Diagnosis not present

## 2017-06-05 LAB — HM DIABETES EYE EXAM

## 2017-06-06 ENCOUNTER — Other Ambulatory Visit: Payer: Self-pay | Admitting: Family Medicine

## 2017-06-06 DIAGNOSIS — E1142 Type 2 diabetes mellitus with diabetic polyneuropathy: Secondary | ICD-10-CM

## 2017-06-15 ENCOUNTER — Ambulatory Visit (INDEPENDENT_AMBULATORY_CARE_PROVIDER_SITE_OTHER): Payer: Medicare Other | Admitting: Family Medicine

## 2017-06-15 VITALS — BP 114/82 | HR 64 | Temp 98.5°F | Resp 16 | Wt 253.0 lb

## 2017-06-15 DIAGNOSIS — E7849 Other hyperlipidemia: Secondary | ICD-10-CM

## 2017-06-15 DIAGNOSIS — E784 Other hyperlipidemia: Secondary | ICD-10-CM

## 2017-06-15 DIAGNOSIS — E1142 Type 2 diabetes mellitus with diabetic polyneuropathy: Secondary | ICD-10-CM | POA: Diagnosis not present

## 2017-06-15 DIAGNOSIS — I10 Essential (primary) hypertension: Secondary | ICD-10-CM

## 2017-06-15 DIAGNOSIS — I2510 Atherosclerotic heart disease of native coronary artery without angina pectoris: Secondary | ICD-10-CM | POA: Diagnosis not present

## 2017-06-15 DIAGNOSIS — I878 Other specified disorders of veins: Secondary | ICD-10-CM

## 2017-06-15 DIAGNOSIS — E038 Other specified hypothyroidism: Secondary | ICD-10-CM | POA: Diagnosis not present

## 2017-06-15 LAB — POCT GLYCOSYLATED HEMOGLOBIN (HGB A1C): Hemoglobin A1C: 6.2

## 2017-06-15 NOTE — Progress Notes (Signed)
Zachary Martinez  MRN: [id number] DOB: Dec 10, 1940  Subjective:  HPI  Patient is here for 4 months follow up. Last lab work was on 06/07/16 and it was stable. DM: patient does not usually check his sugar but checked it this morning and it was 115. No numbness or tingling sensation present. Lab Results  Component Value Date   HGBA1C 7.1 02/09/2017   B/P: patient checks b/p seldom. No chest pain or tightness. Leg swelling better since Dr Nehemiah Massed put patient on Lasix 20 mg 1 daily in the morning. Patient has echo scheduled for 06/29/17. BP Readings from Last 3 Encounters:  06/15/17 114/82  02/09/17 136/64  11/01/16 (!) 143/57   Wt Readings from Last 3 Encounters:  06/15/17 253 lb (114.8 kg)  02/09/17 254 lb (115.2 kg)  11/01/16 260 lb (117.9 kg)    Patient Active Problem List   Diagnosis Date Noted  . Hypertension 06/07/2016  . Type 2 diabetes mellitus ([place]) 10/08/2015  . Abnormal chest x-ray 05/09/2015  . CAD in native artery 05/09/2015  . Benign fibroma of prostate 05/09/2015  . Arteriosclerosis of coronary artery 05/09/2015  . Diabetes mellitus with polyneuropathy ([place]) 05/09/2015  . Diverticulosis of colon 05/09/2015  . ED (erectile dysfunction) of organic origin 05/09/2015  . Family history of colon cancer 05/09/2015  . HLD (hyperlipidemia) 05/09/2015  . Adult hypothyroidism 05/09/2015  . Adiposity 05/09/2015  . Arthritis, degenerative 05/09/2015  . Parathyroid adenoma 05/09/2015  . Apnea, sleep 05/09/2015  . Disorder of bursae and tendons in shoulder region 12/22/2014  . Hypertensive left ventricular hypertrophy 11/24/2014  . MI (mitral incompetence) 11/24/2014  . Obstructive apnea 11/24/2014    Past Medical History:  Diagnosis Date  . CAD (coronary artery disease)   . Diabetes ([place])   . Hyperlipidemia   . Hypertension   . Hypothyroidism   . Sleep apnea    cpap    Social History   Social History  . Marital status: Married    Spouse name: N/A  . Number  of children: N/A  . Years of education: N/A   Occupational History  . Not on file.   Social History Main Topics  . Smoking status: Former Smoker    Quit date: 07/04/1979  . Smokeless tobacco: Former Systems developer    Quit date: 11/01/1979  . Alcohol use No  . Drug use: No  . Sexual activity: Not on file   Other Topics Concern  . Not on file   Social History Narrative  . No narrative on file    Outpatient Encounter Prescriptions as of 06/15/2017  Medication Sig  . amLODipine (NORVASC) 10 MG tablet TAKE 1 TABLET EVERY DAY  . atorvastatin (LIPITOR) 40 MG tablet TAKE 1 TABLET EVERY DAY (Patient taking differently: TAKE 1 TABLET EVERY EVENING)  . carvedilol (COREG) 6.25 MG tablet TAKE 1 TABLET TWICE DAILY  . Cholecalciferol (VITAMIN D3) 1000 UNITS CAPS Take 2,000 Units by mouth daily.   Marland Kitchen EPINEPHRINE, ANAPHYLAXIS THERAPY AGENTS, EPIPEN 2-PAK, 0.3MG /0.3ML (Injection Device) - Historical Medication  as directed (0.3 MG/0.3ML) Active  . furosemide (LASIX) 20 MG tablet Take 1 tablet by mouth every morning.  . hydrALAZINE (APRESOLINE) 100 MG tablet Take 1 tablet by mouth 3 (three) times daily.  . INVOKAMET [phone] MG TABS TAKE 1 TABLET TWICE DAILY  . levothyroxine (SYNTHROID, LEVOTHROID) 88 MCG tablet TAKE 1 TABLET EVERY DAY  . pioglitazone (ACTOS) 30 MG tablet TAKE 1 TABLET (30 MG TOTAL) BY MOUTH DAILY.   No facility-administered encounter  medications on file as of 06/15/2017.     Allergies  Allergen Reactions  . Ace Inhibitors Anaphylaxis and Other (See Comments)    Review of Systems  Constitutional: Negative.   Eyes: Positive for blurred vision.  Respiratory: Negative.   Cardiovascular: Positive for leg swelling (better). Negative for chest pain and palpitations.  Genitourinary: Positive for urgency.  Musculoskeletal: Negative.   Neurological: Negative.     Objective:  BP 114/82   Pulse 64   Temp 98.5 F (36.9 C)   Resp 16   Wt 253 lb (114.8 kg)   BMI 37.36 kg/m   Physical  Exam  Constitutional: He is oriented to person, place, and time and well-developed, well-nourished, and in no distress.  HENT:  Head: Normocephalic and atraumatic.  Eyes: Pupils are equal, round, and reactive to light. Conjunctivae are normal.  Cardiovascular: Normal rate, regular rhythm, normal heart sounds and intact distal pulses.  Exam reveals no gallop.   No murmur heard. Pulmonary/Chest: Effort normal and breath sounds normal. No respiratory distress. He has no wheezes.  Musculoskeletal: He exhibits edema (1+).  Neurological: He is alert and oriented to person, place, and time.   Assessment and Plan :  1. Diabetic polyneuropathy associated with type 2 diabetes mellitus (HCC) A1C 6.2 much better. Continue current medications. - POCT HgB A1C  2. Essential hypertension Stable. continue current regimen. - CBC with Differential/Platelet - Comprehensive metabolic panel  3. Other hyperlipidemia Check lab work today. - Comprehensive metabolic panel - Lipid Panel With LDL/HDL Ratio  4. Other specified hypothyroidism - TSH  5. Venous stasis of lower extremity Better. Edema is better since been on Lasix.  6. Serum calcium elevated - Parathyroid hormone, intact (no Ca)  HPI, Exam and A&P transcribed by Theressa Millard, RMA under direction and in the presence of Miguel Aschoff, MD. I have done the exam and reviewed the chart and it is accurate to the best of my knowledge. Development worker, community has been used and  any errors in dictation or transcription are unintentional. Miguel Aschoff M.D. [facility]

## 2017-06-20 DIAGNOSIS — E038 Other specified hypothyroidism: Secondary | ICD-10-CM | POA: Diagnosis not present

## 2017-06-20 DIAGNOSIS — E784 Other hyperlipidemia: Secondary | ICD-10-CM | POA: Diagnosis not present

## 2017-06-20 DIAGNOSIS — I10 Essential (primary) hypertension: Secondary | ICD-10-CM | POA: Diagnosis not present

## 2017-06-21 ENCOUNTER — Telehealth: Payer: Self-pay | Admitting: Family Medicine

## 2017-06-21 LAB — COMPREHENSIVE METABOLIC PANEL
ALK PHOS: 86 IU/L (ref 39–117)
ALT: 15 IU/L (ref 0–44)
AST: 15 IU/L (ref 0–40)
Albumin/Globulin Ratio: 1.9 (ref 1.2–2.2)
Albumin: 4.1 g/dL (ref 3.5–4.8)
BUN/Creatinine Ratio: 21 (ref 10–24)
BUN: 19 mg/dL (ref 8–27)
Bilirubin Total: 0.4 mg/dL (ref 0.0–1.2)
CALCIUM: 10.5 mg/dL — AB (ref 8.6–10.2)
CO2: 22 mmol/L (ref 20–29)
CREATININE: 0.91 mg/dL (ref 0.76–1.27)
Chloride: 106 mmol/L (ref 96–106)
GFR calc Af Amer: 96 mL/min/{1.73_m2} (ref >59)
GFR, EST NON AFRICAN AMERICAN: 83 mL/min/{1.73_m2} (ref >59)
GLOBULIN, TOTAL: 2.2 g/dL (ref 1.5–4.5)
GLUCOSE: 168 mg/dL — AB (ref 65–99)
Potassium: 4.7 mmol/L (ref 3.5–5.2)
Sodium: 143 mmol/L (ref 134–144)
Total Protein: 6.3 g/dL (ref 6.0–8.5)

## 2017-06-21 LAB — CBC WITH DIFFERENTIAL/PLATELET
BASOS ABS: 0 10*3/uL (ref 0.0–0.2)
Basos: 1 %
EOS (ABSOLUTE): 0.2 10*3/uL (ref 0.0–0.4)
EOS: 3 %
HEMATOCRIT: 42.1 % (ref 37.5–51.0)
Hemoglobin: 13.9 g/dL (ref 13.0–17.7)
IMMATURE GRANULOCYTES: 0 %
Immature Grans (Abs): 0 10*3/uL (ref 0.0–0.1)
Lymphocytes Absolute: 1.4 10*3/uL (ref 0.7–3.1)
Lymphs: 24 %
MCH: 30.7 pg (ref 26.6–33.0)
MCHC: 33 g/dL (ref 31.5–35.7)
MCV: 93 fL (ref 79–97)
MONOCYTES: 9 %
MONOS ABS: 0.5 10*3/uL (ref 0.1–0.9)
NEUTROS PCT: 63 %
Neutrophils Absolute: 3.7 10*3/uL (ref 1.4–7.0)
PLATELETS: 175 10*3/uL (ref 150–379)
RBC: 4.53 x10E6/uL (ref 4.14–5.80)
RDW: 15.6 % — AB (ref 12.3–15.4)
WBC: 5.8 10*3/uL (ref 3.4–10.8)

## 2017-06-21 LAB — TSH: TSH: 3.49 u[IU]/mL (ref 0.450–4.500)

## 2017-06-21 LAB — LIPID PANEL WITH LDL/HDL RATIO
CHOLESTEROL TOTAL: 113 mg/dL (ref 100–199)
HDL: 32 mg/dL — AB (ref >39)
LDL CALC: 33 mg/dL (ref 0–99)
LDL/HDL RATIO: 1 ratio (ref 0.0–3.6)
TRIGLYCERIDES: 240 mg/dL — AB (ref 0–149)
VLDL CHOLESTEROL CAL: 48 mg/dL — AB (ref 5–40)

## 2017-06-21 LAB — PARATHYROID HORMONE, INTACT (NO CA): PTH: 65 pg/mL (ref 15–65)

## 2017-06-21 NOTE — Telephone Encounter (Signed)
Pt is returning call.  CB#[phone]/[id number]

## 2017-06-22 NOTE — Telephone Encounter (Signed)
Advised patient of results.  

## 2017-06-22 NOTE — Telephone Encounter (Signed)
-----   Message from Jerrol Banana., MD sent at 06/21/2017 10:16 AM EDT ----- Labs stable--PTH borderline.

## 2017-06-26 DIAGNOSIS — Z85828 Personal history of other malignant neoplasm of skin: Secondary | ICD-10-CM | POA: Diagnosis not present

## 2017-06-26 DIAGNOSIS — D485 Neoplasm of uncertain behavior of skin: Secondary | ICD-10-CM | POA: Diagnosis not present

## 2017-06-26 DIAGNOSIS — X32XXXA Exposure to sunlight, initial encounter: Secondary | ICD-10-CM | POA: Diagnosis not present

## 2017-06-26 DIAGNOSIS — D2262 Melanocytic nevi of left upper limb, including shoulder: Secondary | ICD-10-CM | POA: Diagnosis not present

## 2017-06-26 DIAGNOSIS — D2261 Melanocytic nevi of right upper limb, including shoulder: Secondary | ICD-10-CM | POA: Diagnosis not present

## 2017-06-26 DIAGNOSIS — D225 Melanocytic nevi of trunk: Secondary | ICD-10-CM | POA: Diagnosis not present

## 2017-06-26 DIAGNOSIS — L820 Inflamed seborrheic keratosis: Secondary | ICD-10-CM | POA: Diagnosis not present

## 2017-06-26 DIAGNOSIS — L570 Actinic keratosis: Secondary | ICD-10-CM | POA: Diagnosis not present

## 2017-06-29 DIAGNOSIS — G4733 Obstructive sleep apnea (adult) (pediatric): Secondary | ICD-10-CM | POA: Diagnosis not present

## 2017-06-29 DIAGNOSIS — I2510 Atherosclerotic heart disease of native coronary artery without angina pectoris: Secondary | ICD-10-CM | POA: Diagnosis not present

## 2017-06-30 ENCOUNTER — Encounter: Payer: Self-pay | Admitting: Family Medicine

## 2017-07-04 DIAGNOSIS — G4733 Obstructive sleep apnea (adult) (pediatric): Secondary | ICD-10-CM | POA: Diagnosis not present

## 2017-07-04 DIAGNOSIS — I10 Essential (primary) hypertension: Secondary | ICD-10-CM | POA: Diagnosis not present

## 2017-07-04 DIAGNOSIS — I119 Hypertensive heart disease without heart failure: Secondary | ICD-10-CM | POA: Diagnosis not present

## 2017-07-04 DIAGNOSIS — I2510 Atherosclerotic heart disease of native coronary artery without angina pectoris: Secondary | ICD-10-CM | POA: Diagnosis not present

## 2017-07-07 ENCOUNTER — Encounter: Payer: Self-pay | Admitting: Family Medicine

## 2017-08-17 DIAGNOSIS — Z23 Encounter for immunization: Secondary | ICD-10-CM | POA: Diagnosis not present

## 2017-09-04 DIAGNOSIS — H35031 Hypertensive retinopathy, right eye: Secondary | ICD-10-CM | POA: Diagnosis not present

## 2017-09-04 LAB — HM DIABETES EYE EXAM

## 2017-10-19 ENCOUNTER — Other Ambulatory Visit: Payer: Self-pay

## 2017-10-19 ENCOUNTER — Ambulatory Visit (INDEPENDENT_AMBULATORY_CARE_PROVIDER_SITE_OTHER): Payer: Medicare Other

## 2017-10-19 VITALS — BP 140/62 | HR 60 | Temp 98.5°F | Ht 69.0 in | Wt 257.2 lb

## 2017-10-19 DIAGNOSIS — I10 Essential (primary) hypertension: Secondary | ICD-10-CM

## 2017-10-19 DIAGNOSIS — E7849 Other hyperlipidemia: Secondary | ICD-10-CM

## 2017-10-19 DIAGNOSIS — Z0000 Encounter for general adult medical examination without abnormal findings: Secondary | ICD-10-CM

## 2017-10-19 MED ORDER — CARVEDILOL 6.25 MG PO TABS: 6.2500 mg | ORAL_TABLET | Freq: Two times a day (BID) | ORAL | 3 refills | Status: DC

## 2017-10-19 MED ORDER — ATORVASTATIN CALCIUM 40 MG PO TABS: 40.0000 mg | ORAL_TABLET | Freq: Every day | ORAL | 3 refills | Status: DC

## 2017-10-19 NOTE — Patient Instructions (Signed)
Zachary Martinez , Thank you for taking time to come for your Medicare Wellness Visit. I appreciate your ongoing commitment to your health goals. Please review the following plan we discussed and let me know if I can assist you in the future.   Screening recommendations/referrals: Colonoscopy: Up to date Recommended yearly ophthalmology/optometry visit for glaucoma screening and checkup Recommended yearly dental visit for hygiene and checkup  Vaccinations: Influenza vaccine: Up to date Pneumococcal vaccine: Up to date Tdap vaccine: Up to date Shingles vaccine: Pt declines today.   Advanced directives: Already on file.  Conditions/risks identified: Fall risk prevention; Obesity- recommend cutting portion sizes in half and eating 3 small meals a day with 2 healthy snacks in between.   Next appointment: 10/26/17 @ 9:30 AM  Preventive Care 65 Years and Older, Male Preventive care refers to lifestyle choices and visits with your health care provider that can promote health and wellness. What does preventive care include?  A yearly physical exam. This is also called an annual well check.  Dental exams once or twice a year.  Routine eye exams. Ask your health care provider how often you should have your eyes checked.  Personal lifestyle choices, including:  Daily care of your teeth and gums.  Regular physical activity.  Eating a healthy diet.  Avoiding tobacco and drug use.  Limiting alcohol use.  Practicing safe sex.  Taking low doses of aspirin every day.  Taking vitamin and mineral supplements as recommended by your health care provider. What happens during an annual well check? The services and screenings done by your health care provider during your annual well check will depend on your age, overall health, lifestyle risk factors, and family history of disease. Counseling  Your health care provider may ask you questions about your:  Alcohol use.  Tobacco use.  Drug  use.  Emotional well-being.  Home and relationship well-being.  Sexual activity.  Eating habits.  History of falls.  Memory and ability to understand (cognition).  Work and work Statistician. Screening  You may have the following tests or measurements:  Height, weight, and BMI.  Blood pressure.  Lipid and cholesterol levels. These may be checked every 5 years, or more frequently if you are over 38 years old.  Skin check.  Lung cancer screening. You may have this screening every year starting at age 62 if you have a 30-pack-year history of smoking and currently smoke or have quit within the past 15 years.  Fecal occult blood test (FOBT) of the stool. You may have this test every year starting at age 44.  Flexible sigmoidoscopy or colonoscopy. You may have a sigmoidoscopy every 5 years or a colonoscopy every 10 years starting at age 77.  Prostate cancer screening. Recommendations will vary depending on your family history and other risks.  Hepatitis C blood test.  Hepatitis B blood test.  Sexually transmitted disease (STD) testing.  Diabetes screening. This is done by checking your blood sugar (glucose) after you have not eaten for a while (fasting). You may have this done every 1-3 years.  Abdominal aortic aneurysm (AAA) screening. You may need this if you are a current or former smoker.  Osteoporosis. You may be screened starting at age 7 if you are at high risk. Talk with your health care provider about your test results, treatment options, and if necessary, the need for more tests. Vaccines  Your health care provider may recommend certain vaccines, such as:  Influenza vaccine. This is recommended every  year.  Tetanus, diphtheria, and acellular pertussis (Tdap, Td) vaccine. You may need a Td booster every 10 years.  Zoster vaccine. You may need this after age 61.  Pneumococcal 13-valent conjugate (PCV13) vaccine. One dose is recommended after age  11.  Pneumococcal polysaccharide (PPSV23) vaccine. One dose is recommended after age 51. Talk to your health care provider about which screenings and vaccines you need and how often you need them. This information is not intended to replace advice given to you by your health care provider. Make sure you discuss any questions you have with your health care provider. Document Released: 11/13/2015 Document Revised: 07/06/2016 Document Reviewed: 08/18/2015 Elsevier Interactive Patient Education  2017 [place] Prevention in the Home Falls can cause injuries. They can happen to people of all ages. There are many things you can do to make your home safe and to help prevent falls. What can I do on the outside of my home?  Regularly fix the edges of walkways and driveways and fix any cracks.  Remove anything that might make you trip as you walk through a door, such as a raised step or threshold.  Trim any bushes or trees on the path to your home.  Use bright outdoor lighting.  Clear any walking paths of anything that might make someone trip, such as rocks or tools.  Regularly check to see if handrails are loose or broken. Make sure that both sides of any steps have handrails.  Any raised decks and porches should have guardrails on the edges.  Have any leaves, snow, or ice cleared regularly.  Use sand or salt on walking paths during winter.  Clean up any spills in your garage right away. This includes oil or grease spills. What can I do in the bathroom?  Use night lights.  Install grab bars by the toilet and in the tub and shower. Do not use towel bars as grab bars.  Use non-skid mats or decals in the tub or shower.  If you need to sit down in the shower, use a plastic, non-slip stool.  Keep the floor dry. Clean up any water that spills on the floor as soon as it happens.  Remove soap buildup in the tub or shower regularly.  Attach bath mats securely with double-sided  non-slip rug tape.  Do not have throw rugs and other things on the floor that can make you trip. What can I do in the bedroom?  Use night lights.  Make sure that you have a light by your bed that is easy to reach.  Do not use any sheets or blankets that are too big for your bed. They should not hang down onto the floor.  Have a firm chair that has side arms. You can use this for support while you get dressed.  Do not have throw rugs and other things on the floor that can make you trip. What can I do in the kitchen?  Clean up any spills right away.  Avoid walking on wet floors.  Keep items that you use a lot in easy-to-reach places.  If you need to reach something above you, use a strong step stool that has a grab bar.  Keep electrical cords out of the way.  Do not use floor polish or wax that makes floors slippery. If you must use wax, use non-skid floor wax.  Do not have throw rugs and other things on the floor that can make you trip. What can  I do with my stairs?  Do not leave any items on the stairs.  Make sure that there are handrails on both sides of the stairs and use them. Fix handrails that are broken or loose. Make sure that handrails are as long as the stairways.  Check any carpeting to make sure that it is firmly attached to the stairs. Fix any carpet that is loose or worn.  Avoid having throw rugs at the top or bottom of the stairs. If you do have throw rugs, attach them to the floor with carpet tape.  Make sure that you have a light switch at the top of the stairs and the bottom of the stairs. If you do not have them, ask someone to add them for you. What else can I do to help prevent falls?  Wear shoes that:  Do not have high heels.  Have rubber bottoms.  Are comfortable and fit you well.  Are closed at the toe. Do not wear sandals.  If you use a stepladder:  Make sure that it is fully opened. Do not climb a closed stepladder.  Make sure that both  sides of the stepladder are locked into place.  Ask someone to hold it for you, if possible.  Clearly mark and make sure that you can see:  Any grab bars or handrails.  First and last steps.  Where the edge of each step is.  Use tools that help you move around (mobility aids) if they are needed. These include:  Canes.  Walkers.  Scooters.  Crutches.  Turn on the lights when you go into a dark area. Replace any light bulbs as soon as they burn out.  Set up your furniture so you have a clear path. Avoid moving your furniture around.  If any of your floors are uneven, fix them.  If there are any pets around you, be aware of where they are.  Review your medicines with your doctor. Some medicines can make you feel dizzy. This can increase your chance of falling. Ask your doctor what other things that you can do to help prevent falls. This information is not intended to replace advice given to you by your health care provider. Make sure you discuss any questions you have with your health care provider. Document Released: 08/13/2009 Document Revised: 03/24/2016 Document Reviewed: 11/21/2014 Elsevier Interactive Patient Education  2017 [employer].

## 2017-10-19 NOTE — Progress Notes (Signed)
Subjective:   Zachary Martinez is a 76 y.o. male who presents for Medicare Annual/Subsequent preventive examination.  Review of Systems:  N/A  Cardiac Risk Factors include: advanced age (>6men, >62 women);diabetes mellitus;dyslipidemia;hypertension;male gender;obesity (BMI >30kg/m2)     Objective:    Vitals: BP 140/62 (BP Location: Left Arm)   Pulse 60   Temp 98.5 F (36.9 C) (Oral)   Ht 5\' 9"  (1.753 m)   Wt 257 lb 3.2 oz (116.7 kg)   BMI 37.98 kg/m   Body mass index is 37.98 kg/m.  Advanced Directives 10/19/2017 11/01/2016 06/07/2016 07/31/2014  Does Patient Have a Medical Advance Directive? Yes No No No;Yes  Type of Paramedic of [place];Living will - - Living will  Copy of [place] in Chart? Yes - - -  Would patient like information on creating a medical advance directive? - No - Patient declined - -    Tobacco Social History   Tobacco Use  Smoking Status Former Smoker  . Last attempt to quit: 07/04/1979  . Years since quitting: 38.3  Smokeless Tobacco Former Systems developer  . Quit date: 11/01/1979     Counseling given: Not Answered   Clinical Intake:  Pre-visit preparation completed: Yes  Pain : No/denies pain Pain Score: 0-No pain     Nutritional Status: BMI > 30  Obese Nutritional Risks: None Diabetes: Yes(type 2) CBG done?: No Did pt. bring in CBG monitor from home?: No  How often do you need to have someone help you when you read instructions, pamphlets, or other written materials from your doctor or pharmacy?: 1 - Never  Interpreter Needed?: No  Information entered by :: [facility], LPN  Past Medical History:  Diagnosis Date  . CAD (coronary artery disease)   . Diabetes ([place])   . Hyperlipidemia   . Hypertension   . Hypothyroidism   . Sleep apnea    cpap   Past Surgical History:  Procedure Laterality Date  . CATARACT EXTRACTION W/PHACO Right 11/01/2016   Procedure: CATARACT EXTRACTION PHACO AND INTRAOCULAR LENS  PLACEMENT (IOC);  Surgeon: Birder Robson, MD;  Location: [facility] ORS;  Service: Ophthalmology;  Laterality: Right;  Lot #[id number] H [place]: oo:35.4 AP%; 22.4 CDE: 7.91  . CYST REMOVAL NECK    . KNEE ARTHROSCOPY Right 2003, 2005   x2  . KNEE ARTHROSCOPY Left 2013  . nasal polyps  1978  . TIBIA FRACTURE SURGERY Left 2012  . TONSILLECTOMY AND ADENOIDECTOMY    . uvuloplasty  2000   Family History  Problem Relation Age of Onset  . Colon cancer Brother 56  . Colon cancer Maternal Aunt 1  . Colon cancer Maternal Aunt 77  . Kidney disease Mother   . Anemia Mother   . Diabetes Brother   . Hyperlipidemia Brother   . Hypertension Brother    Social History   Socioeconomic History  . Marital status: Married    Spouse name: None  . Number of children: 3  . Years of education: None  . Highest education level: Associate degree: occupational, Hotel manager, or vocational program  Social Needs  . Financial resource strain: Not hard at all  . Food insecurity - worry: Never true  . Food insecurity - inability: Never true  . Transportation needs - medical: No  . Transportation needs - non-medical: No  Occupational History  . Occupation: retired  Tobacco Use  . Smoking status: Former Smoker    Last attempt to quit: 07/04/1979    Years since  quitting: 38.3  . Smokeless tobacco: Former Systems developer    Quit date: 11/01/1979  Substance and Sexual Activity  . Alcohol use: Yes    Comment: rare  . Drug use: No  . Sexual activity: None  Other Topics Concern  . None  Social History Narrative  . None    Outpatient Encounter Medications as of 10/19/2017  Medication Sig  . amLODipine (NORVASC) 10 MG tablet TAKE 1 TABLET EVERY DAY  . atorvastatin (LIPITOR) 40 MG tablet TAKE 1 TABLET EVERY DAY (Patient taking differently: TAKE 1 TABLET EVERY EVENING)  . carvedilol (COREG) 6.25 MG tablet TAKE 1 TABLET TWICE DAILY  . Cholecalciferol (VITAMIN D3) 1000 UNITS CAPS Take 2,000 Units by mouth daily.   Marland Kitchen EPINEPHRINE,  ANAPHYLAXIS THERAPY AGENTS, EPIPEN 2-PAK, 0.3MG /0.3ML (Injection Device) - Historical Medication  as directed (0.3 MG/0.3ML) Active  . furosemide (LASIX) 20 MG tablet Take 1 tablet by mouth every morning.  . hydrALAZINE (APRESOLINE) 100 MG tablet Take 1 tablet by mouth 3 (three) times daily.   . INVOKAMET [phone] MG TABS TAKE 1 TABLET TWICE DAILY  . levothyroxine (SYNTHROID, LEVOTHROID) 88 MCG tablet TAKE 1 TABLET EVERY DAY  . pioglitazone (ACTOS) 30 MG tablet TAKE 1 TABLET (30 MG TOTAL) BY MOUTH DAILY.   No facility-administered encounter medications on file as of 10/19/2017.     Activities of Daily Living In your present state of health, do you have any difficulty performing the following activities: 10/19/2017  Hearing? Y  Comment due to tinnitus  Vision? Y  Comment due to an "eye condition" pt is unsure of the dx name  Difficulty concentrating or making decisions? N  Walking or climbing stairs? N  Dressing or bathing? N  Doing errands, shopping? N  Preparing Food and eating ? N  Using the Toilet? N  In the past six months, have you accidently leaked urine? N  Managing your Medications? N  Managing your Finances? N  Housekeeping or managing your Housekeeping? N  Some recent data might be hidden    Patient Care Team: Jerrol Banana., MD as PCP - General (Family Medicine) Eulogio Bear, MD as Consulting Physician (Ophthalmology) Birder Robson, MD as Referring Physician (Ophthalmology) Corey Skains, MD as Consulting Physician (Cardiology) Hessie Knows, MD as Consulting Physician (Orthopedic Surgery)   Assessment:   This is a routine wellness examination for Zachary Martinez.  Exercise Activities and Dietary recommendations Current Exercise Habits: The patient does not participate in regular exercise at present, Exercise limited by: None identified  Goals    . DIET - REDUCE PORTION SIZE     Recommend cutting portion sizes in half and eating 3 small meals a day  with 2 healthy snacks in between.        Fall Risk Fall Risk  10/19/2017 10/18/2016 10/08/2015 06/08/2015  Falls in the past year? Yes No No No  Number falls in past yr: 1 - - -  Comment tripped over a tree - - -  Injury with Fall? No - - -  Follow up Falls prevention discussed - - -   Is the patient's home free of loose throw rugs in walkways, pet beds, electrical cords, etc?   yes      Grab bars in the bathroom? yes      Handrails on the stairs?   yes      Adequate lighting?   yes  Timed Get Up and Go Performed: N/A  Depression Screen PHQ 2/9 Scores 10/19/2017 10/18/2016 10/08/2015  06/08/2015  PHQ - 2 Score 0 0 0 0    Cognitive Function: Pt declined screening today.        Immunization History  Administered Date(s) Administered  . Influenza Split 08/21/2012  . Influenza, High Dose Seasonal PF 07/22/2014, 08/17/2017  . Influenza-Unspecified 09/07/2015  . Pneumococcal Conjugate-13 06/16/2014  . Pneumococcal Polysaccharide-23 06/18/2012  . Td 11/21/2003, 01/24/2015  . Zoster 06/18/2012    Qualifies for Shingles Vaccine? Due for Shingles vaccine. Declined my offer to administer today. Education has been provided regarding the importance of this vaccine. Pt has been advised to call her insurance company to determine her out of pocket expense. Advised she may also receive this vaccine at her local pharmacy or Health Dept. Verbalized acceptance and understanding.  Screening Tests Health Maintenance  Topic Date Due  . URINE MICROALBUMIN  10/18/2017  . HEMOGLOBIN A1C  12/16/2017  . FOOT EXAM  02/09/2018  . OPHTHALMOLOGY EXAM  09/04/2018  . COLONOSCOPY  08/15/2019  . TETANUS/TDAP  01/23/2025  . INFLUENZA VACCINE  Completed  . PNA vac Low Risk Adult  Completed   Cancer Screenings: Lung: Low Dose CT Chest recommended if Age 73-80 years, 30 pack-year currently smoking OR have quit w/in 15years. Patient does not qualify. Colorectal: Up to date  Additional Screenings:    Hepatitis B/HIV/Syphillis: Pt declines today.  Hepatitis C Screening: Pt declines today.     Plan:  I have personally reviewed and addressed the Medicare Annual Wellness questionnaire and have noted the following in the patient's chart:  A. Medical and social history B. Use of alcohol, tobacco or illicit drugs  C. Current medications and supplements D. Functional ability and status E.  Nutritional status F.  Physical activity G. Advance directives H. List of other physicians I.  Hospitalizations, surgeries, and ER visits in previous 12 months J.  Gem such as hearing and vision if needed, cognitive and depression L. Referrals and appointments - none  In addition, I have reviewed and discussed with patient certain preventive protocols, quality metrics, and best practice recommendations. A written personalized care plan for preventive services as well as general preventive health recommendations were provided to patient.  See attached scanned questionnaire for additional information.   Signed,  Fabio Neighbors, LPN Nurse Health Advisor   Nurse Recommendations: Pt needs a urine microalbumin checked at next [place] on 10/26/17.

## 2017-10-19 NOTE — Telephone Encounter (Signed)
Pt was seen for an AWV today and is requesting a 90 day refill on Carvedilol 6.25mg  and Atorvastatin 40 mg sent to [facility]. Pt states he needs this filled asap.  Thanks,  [facility]

## 2017-10-25 NOTE — Telephone Encounter (Signed)
Ok to do this--thx

## 2017-10-26 ENCOUNTER — Other Ambulatory Visit: Payer: Self-pay

## 2017-10-26 ENCOUNTER — Ambulatory Visit (INDEPENDENT_AMBULATORY_CARE_PROVIDER_SITE_OTHER): Payer: Medicare Other | Admitting: Family Medicine

## 2017-10-26 VITALS — BP 130/58 | HR 58 | Temp 98.0°F | Resp 16 | Wt 259.0 lb

## 2017-10-26 DIAGNOSIS — I10 Essential (primary) hypertension: Secondary | ICD-10-CM

## 2017-10-26 DIAGNOSIS — E7849 Other hyperlipidemia: Secondary | ICD-10-CM

## 2017-10-26 DIAGNOSIS — G4733 Obstructive sleep apnea (adult) (pediatric): Secondary | ICD-10-CM

## 2017-10-26 DIAGNOSIS — E1142 Type 2 diabetes mellitus with diabetic polyneuropathy: Secondary | ICD-10-CM | POA: Diagnosis not present

## 2017-10-26 DIAGNOSIS — Z1211 Encounter for screening for malignant neoplasm of colon: Secondary | ICD-10-CM | POA: Diagnosis not present

## 2017-10-26 DIAGNOSIS — Z6838 Body mass index (BMI) 38.0-38.9, adult: Secondary | ICD-10-CM

## 2017-10-26 DIAGNOSIS — I2510 Atherosclerotic heart disease of native coronary artery without angina pectoris: Secondary | ICD-10-CM | POA: Diagnosis not present

## 2017-10-26 LAB — IFOBT (OCCULT BLOOD): IFOBT: NEGATIVE

## 2017-10-26 LAB — POCT GLYCOSYLATED HEMOGLOBIN (HGB A1C): HEMOGLOBIN A1C: 7.2

## 2017-10-26 NOTE — Progress Notes (Signed)
JANET DECESARE  MRN: [id number] DOB: 09-14-1941  Subjective:  HPI   The patient is a 76 year old male who presents for follow up to his annual wellness exam.  He was last seen  On 10/19/17 by the nurse health advisor.  Her had routine labs 80 06/20/17.    Immunization History  Administered Date(s) Administered  . Influenza Split 08/21/2012  . Influenza, High Dose Seasonal PF 07/22/2014, 08/17/2017  . Influenza-Unspecified 09/07/2015  . Pneumococcal Conjugate-13 06/16/2014  . Pneumococcal Polysaccharide-23 06/18/2012  . Td 11/21/2003, 01/24/2015  . Zoster 06/18/2012    08/14/14 Colonoscopy, Dr. Liston Alba, internal hemorrhoids, tubular adenoma, repeat in 2020.  Patient Active Problem List   Diagnosis Date Noted  . Venous insufficiency of both lower extremities 06/02/2017  . Hypertension 06/07/2016  . Type 2 diabetes mellitus ([place]) 10/08/2015  . Abnormal chest x-ray 05/09/2015  . CAD in native artery 05/09/2015  . Benign fibroma of prostate 05/09/2015  . Arteriosclerosis of coronary artery 05/09/2015  . Diabetes mellitus with polyneuropathy ([place]) 05/09/2015  . Diverticulosis of colon 05/09/2015  . ED (erectile dysfunction) of organic origin 05/09/2015  . Family history of colon cancer 05/09/2015  . HLD (hyperlipidemia) 05/09/2015  . Adult hypothyroidism 05/09/2015  . Adiposity 05/09/2015  . Arthritis, degenerative 05/09/2015  . Parathyroid adenoma 05/09/2015  . Apnea, sleep 05/09/2015  . Disorder of bursae and tendons in shoulder region 12/22/2014  . Hypertensive left ventricular hypertrophy 11/24/2014  . MI (mitral incompetence) 11/24/2014  . Obstructive apnea 11/24/2014    Past Medical History:  Diagnosis Date  . CAD (coronary artery disease)   . Diabetes ([place])   . Hyperlipidemia   . Hypertension   . Hypothyroidism   . Sleep apnea    cpap    Social History   Socioeconomic History  . Marital status: Married    Spouse name: Not on file  . Number  of children: 3  . Years of education: Not on file  . Highest education level: Associate degree: occupational, Hotel manager, or vocational program  Social Needs  . Financial resource strain: Not hard at all  . Food insecurity - worry: Never true  . Food insecurity - inability: Never true  . Transportation needs - medical: No  . Transportation needs - non-medical: No  Occupational History  . Occupation: retired  Tobacco Use  . Smoking status: Former Smoker    Last attempt to quit: 07/04/1979    Years since quitting: 38.3  . Smokeless tobacco: Former Systems developer    Quit date: 11/01/1979  Substance and Sexual Activity  . Alcohol use: Yes    Comment: rare  . Drug use: No  . Sexual activity: Not on file  Other Topics Concern  . Not on file  Social History Narrative  . Not on file    Outpatient Encounter Medications as of 10/26/2017  Medication Sig  . amLODipine (NORVASC) 10 MG tablet TAKE 1 TABLET EVERY DAY  . atorvastatin (LIPITOR) 40 MG tablet Take 1 tablet (40 mg total) by mouth daily.  . carvedilol (COREG) 6.25 MG tablet Take 1 tablet (6.25 mg total) by mouth 2 (two) times daily.  . Cholecalciferol (VITAMIN D3) 1000 UNITS CAPS Take 2,000 Units by mouth daily.   Marland Kitchen EPINEPHRINE, ANAPHYLAXIS THERAPY AGENTS, EPIPEN 2-PAK, 0.3MG /0.3ML (Injection Device) - Historical Medication  as directed (0.3 MG/0.3ML) Active  . furosemide (LASIX) 20 MG tablet Take 1 tablet by mouth every morning.  . hydrALAZINE (APRESOLINE) 100 MG tablet Take 1 tablet by mouth  3 (three) times daily.   . INVOKAMET [phone] MG TABS TAKE 1 TABLET TWICE DAILY  . levothyroxine (SYNTHROID, LEVOTHROID) 88 MCG tablet TAKE 1 TABLET EVERY DAY  . pioglitazone (ACTOS) 30 MG tablet TAKE 1 TABLET (30 MG TOTAL) BY MOUTH DAILY.   No facility-administered encounter medications on file as of 10/26/2017.     Allergies  Allergen Reactions  . Ace Inhibitors Anaphylaxis and Other (See Comments)    Review of Systems  Constitutional:  Negative.   HENT: Positive for tinnitus.   Eyes: Positive for photophobia.  Respiratory: Negative.   Cardiovascular: Positive for leg swelling.  Gastrointestinal: Negative.   Genitourinary: Positive for frequency.       Enuresis   Musculoskeletal: Negative.   Skin: Negative.   Neurological: Negative.   Endo/Heme/Allergies: Bruises/bleeds easily.  Psychiatric/Behavioral: Negative.     Objective:  BP (!) 130/58 (BP Location: Right Arm, Patient Position: Sitting, Cuff Size: Normal)   Pulse (!) 58   Temp 98 F (36.7 C) (Oral)   Resp 16   Wt 259 lb (117.5 kg)   BMI 38.25 kg/m   Physical Exam  Constitutional: He is oriented to person, place, and time and well-developed, well-nourished, and in no distress.  HENT:  Head: Normocephalic and atraumatic.  Right Ear: External ear normal.  Left Ear: External ear normal.  Nose: Nose normal.  Mouth/Throat: Oropharynx is clear and moist.  Eyes: Conjunctivae and EOM are normal. Pupils are equal, round, and reactive to light.  Neck: Normal range of motion. Neck supple.  Cardiovascular: Normal rate, regular rhythm, normal heart sounds and intact distal pulses.  Pulmonary/Chest: Effort normal and breath sounds normal.  Abdominal: Soft. Bowel sounds are normal.  Genitourinary: Rectum normal, prostate normal and penis normal.  Musculoskeletal: Normal range of motion.  Neurological: He is alert and oriented to person, place, and time. Gait normal. GCS score is 15.  Skin: Skin is warm and dry.  Psychiatric: Mood, memory, affect and judgment normal.    Assessment and Plan :   Obesity TIIDM HTN OA HLD Weight loss stressed. ASCVD Risk factors treated. Ongoing Right Retinal Bleed Per ophthalmology. FH of Colon Cancer Colonoscopy every 5 years.  I have done the exam and reviewed the chart and it is accurate to the best of my knowledge. Development worker, community has been used and  any errors in dictation or transcription are  unintentional. Miguel Aschoff M.D. [facility]

## 2017-11-04 ENCOUNTER — Encounter: Payer: Self-pay | Admitting: Family Medicine

## 2018-01-02 ENCOUNTER — Other Ambulatory Visit: Payer: Self-pay | Admitting: Family Medicine

## 2018-01-08 ENCOUNTER — Other Ambulatory Visit: Payer: Self-pay | Admitting: Family Medicine

## 2018-01-09 ENCOUNTER — Ambulatory Visit (INDEPENDENT_AMBULATORY_CARE_PROVIDER_SITE_OTHER): Payer: Medicare Other | Admitting: Family Medicine

## 2018-01-09 VITALS — BP 144/70 | HR 60 | Temp 97.6°F | Resp 16

## 2018-01-09 DIAGNOSIS — R350 Frequency of micturition: Secondary | ICD-10-CM | POA: Diagnosis not present

## 2018-01-09 DIAGNOSIS — N400 Enlarged prostate without lower urinary tract symptoms: Secondary | ICD-10-CM

## 2018-01-09 DIAGNOSIS — Z6838 Body mass index (BMI) 38.0-38.9, adult: Secondary | ICD-10-CM | POA: Diagnosis not present

## 2018-01-09 DIAGNOSIS — I872 Venous insufficiency (chronic) (peripheral): Secondary | ICD-10-CM

## 2018-01-09 DIAGNOSIS — G4733 Obstructive sleep apnea (adult) (pediatric): Secondary | ICD-10-CM | POA: Diagnosis not present

## 2018-01-09 LAB — POCT URINALYSIS DIPSTICK
Bilirubin, UA: NEGATIVE
Glucose, UA: 2000
Ketones, UA: NEGATIVE
LEUKOCYTES UA: NEGATIVE
Nitrite, UA: NEGATIVE
PH UA: 5 (ref 5.0–8.0)
Protein, UA: NEGATIVE
RBC UA: NEGATIVE
Spec Grav, UA: 1.01 (ref 1.010–1.025)
UROBILINOGEN UA: 0.2 U/dL

## 2018-01-09 NOTE — Progress Notes (Signed)
ARIZONA NORDQUIST  MRN: [id number] DOB: 1941/03/12  Subjective:  HPI   The patient is a 77 year old male who presents with complaint of urinary frequency, decreased stream and enuresis at times. He denies any pain or burning with urination.He has urgency. No dysuria.  Patient Active Problem List   Diagnosis Date Noted  . Venous insufficiency of both lower extremities 06/02/2017  . Hypertension 06/07/2016  . Type 2 diabetes mellitus ([place]) 10/08/2015  . Abnormal chest x-ray 05/09/2015  . CAD in native artery 05/09/2015  . Benign fibroma of prostate 05/09/2015  . Arteriosclerosis of coronary artery 05/09/2015  . Diabetes mellitus with polyneuropathy ([place]) 05/09/2015  . Diverticulosis of colon 05/09/2015  . ED (erectile dysfunction) of organic origin 05/09/2015  . Family history of colon cancer 05/09/2015  . HLD (hyperlipidemia) 05/09/2015  . Adult hypothyroidism 05/09/2015  . Adiposity 05/09/2015  . Arthritis, degenerative 05/09/2015  . Parathyroid adenoma 05/09/2015  . Apnea, sleep 05/09/2015  . Disorder of bursae and tendons in shoulder region 12/22/2014  . Hypertensive left ventricular hypertrophy 11/24/2014  . MI (mitral incompetence) 11/24/2014  . Obstructive apnea 11/24/2014    Past Medical History:  Diagnosis Date  . CAD (coronary artery disease)   . Diabetes ([place])   . Hyperlipidemia   . Hypertension   . Hypothyroidism   . Sleep apnea    cpap    Social History   Socioeconomic History  . Marital status: Married    Spouse name: Not on file  . Number of children: 3  . Years of education: Not on file  . Highest education level: Associate degree: occupational, Hotel manager, or vocational program  Social Needs  . Financial resource strain: Not hard at all  . Food insecurity - worry: Never true  . Food insecurity - inability: Never true  . Transportation needs - medical: No  . Transportation needs - non-medical: No  Occupational History  . Occupation: retired    Tobacco Use  . Smoking status: Former Smoker    Last attempt to quit: 07/04/1979    Years since quitting: 38.5  . Smokeless tobacco: Former Systems developer    Quit date: 11/01/1979  Substance and Sexual Activity  . Alcohol use: Yes    Comment: rare  . Drug use: No  . Sexual activity: Not on file  Other Topics Concern  . Not on file  Social History Narrative  . Not on file    Outpatient Encounter Medications as of 01/09/2018  Medication Sig  . amLODipine (NORVASC) 10 MG tablet TAKE 1 TABLET EVERY DAY  . atorvastatin (LIPITOR) 40 MG tablet Take 1 tablet (40 mg total) by mouth daily.  . carvedilol (COREG) 6.25 MG tablet Take 1 tablet (6.25 mg total) by mouth 2 (two) times daily.  . Cholecalciferol (VITAMIN D3) 1000 UNITS CAPS Take 2,000 Units by mouth daily.   Marland Kitchen EPINEPHRINE, ANAPHYLAXIS THERAPY AGENTS, EPIPEN 2-PAK, 0.3MG /0.3ML (Injection Device) - Historical Medication  as directed (0.3 MG/0.3ML) Active  . furosemide (LASIX) 20 MG tablet Take 1 tablet by mouth every morning.  . hydrALAZINE (APRESOLINE) 100 MG tablet Take 1 tablet by mouth 3 (three) times daily.   . INVOKAMET [phone] MG TABS TAKE 1 TABLET TWICE DAILY  . levothyroxine (SYNTHROID, LEVOTHROID) 88 MCG tablet TAKE 1 TABLET EVERY DAY  . pioglitazone (ACTOS) 30 MG tablet TAKE 1 TABLET (30 MG TOTAL) BY MOUTH DAILY.   No facility-administered encounter medications on file as of 01/09/2018.     Allergies  Allergen Reactions  .  Ace Inhibitors Anaphylaxis and Other (See Comments)    Review of Systems  Constitutional: Positive for malaise/fatigue. Negative for fever.  Eyes: Negative.   Respiratory: Negative for shortness of breath.   Cardiovascular: Negative for chest pain and palpitations.  Gastrointestinal: Negative.   Genitourinary: Positive for frequency and urgency. Negative for dysuria, flank pain and hematuria.  Musculoskeletal: Negative for back pain.  Skin: Negative.   Neurological: Negative.  Negative for weakness.   Endo/Heme/Allergies: Negative.   Psychiatric/Behavioral: Negative.     Objective:  BP (!) 144/70 (BP Location: Right Arm, Patient Position: Sitting, Cuff Size: Normal)   Pulse 60   Temp 97.6 F (36.4 C) (Oral)   Resp 16   Physical Exam  Constitutional: He is oriented to person, place, and time and well-developed, well-nourished, and in no distress.  HENT:  Head: Normocephalic and atraumatic.  Eyes: Conjunctivae are normal.  Neck: No thyromegaly present.  Cardiovascular: Normal rate, regular rhythm and normal heart sounds.  Pulmonary/Chest: Effort normal and breath sounds normal.  Abdominal: Soft.  Musculoskeletal: He exhibits edema.  1+LE edema.  Neurological: He is alert and oriented to person, place, and time.  Skin: Skin is warm and dry.  Psychiatric: Mood, memory, affect and judgment normal.    Assessment and Plan :  Urine Frequency/Urgency  most of the symptoms are due to a combination of BPH and Invokamet.It has helpe DM control a lot so will continue until next A1C visit and probably stop it then.Possibly stop/hold lasix but pt has significant edema. TIIDM Obesity BPH Consider Flomax.  I have done the exam and reviewed the chart and it is accurate to the best of my knowledge. Development worker, community has been used and  any errors in dictation or transcription are unintentional. Miguel Aschoff M.D. [facility]

## 2018-01-31 ENCOUNTER — Other Ambulatory Visit: Payer: Self-pay | Admitting: Family Medicine

## 2018-02-22 ENCOUNTER — Ambulatory Visit (INDEPENDENT_AMBULATORY_CARE_PROVIDER_SITE_OTHER): Payer: Medicare Other | Admitting: Family Medicine

## 2018-02-22 ENCOUNTER — Encounter: Payer: Self-pay | Admitting: Family Medicine

## 2018-02-22 VITALS — BP 122/58 | HR 56 | Temp 97.8°F | Resp 16 | Wt 250.0 lb

## 2018-02-22 DIAGNOSIS — E1165 Type 2 diabetes mellitus with hyperglycemia: Secondary | ICD-10-CM | POA: Diagnosis not present

## 2018-02-22 DIAGNOSIS — R350 Frequency of micturition: Secondary | ICD-10-CM | POA: Diagnosis not present

## 2018-02-22 LAB — POCT URINALYSIS DIPSTICK
Bilirubin, UA: NEGATIVE
Blood, UA: NEGATIVE
GLUCOSE UA: 1000
Ketones, UA: NEGATIVE
LEUKOCYTES UA: NEGATIVE
NITRITE UA: NEGATIVE
PROTEIN UA: NEGATIVE
Spec Grav, UA: 1.02 (ref 1.010–1.025)
Urobilinogen, UA: 0.2 E.U./dL
pH, UA: 6 (ref 5.0–8.0)

## 2018-02-22 LAB — POCT UA - MICROALBUMIN: Microalbumin Ur, POC: 20 mg/L

## 2018-02-22 LAB — POCT GLYCOSYLATED HEMOGLOBIN (HGB A1C): HEMOGLOBIN A1C: 7.4

## 2018-02-22 NOTE — Progress Notes (Signed)
Patient: Zachary Martinez Male    DOB: 1941-07-01   77 y.o.   MRN: [id number] Visit Date: 02/22/2018  Today's Provider: [name], MD   Chief Complaint  Patient presents with  . Diabetes  . Urinary Frequency   Subjective:    HPI  Diabetes Mellitus Type II, Follow-up:   Lab Results  Component Value Date   HGBA1C 7.2 10/26/2017   HGBA1C 6.2 06/15/2017   HGBA1C 7.1 02/09/2017    Last seen for diabetes 4 months ago.  Management since then includes none. He reports good compliance with treatment. He is not having side effects.  Home blood sugar records: not being checked  Episodes of hypoglycemia? no   Current Insulin Regimen: n/a Most Recent Eye Exam: 09/04/17 Current exercise: gardening  Pertinent Labs:    Component Value Date/Time   CHOL 113 06/20/2017 0802   TRIG 240 (H) 06/20/2017 0802   HDL 32 (L) 06/20/2017 0802   LDLCALC 33 06/20/2017 0802   CREATININE 0.91 06/20/2017 0802   CREATININE 1.26 12/06/2012 0426    Wt Readings from Last 3 Encounters:  02/22/18 250 lb (113.4 kg)  10/26/17 259 lb (117.5 kg)  10/19/17 257 lb 3.2 oz (116.7 kg)    ------------------------------------------------------------------------  Urinary Frequency- thought to be due to BPH and Invokamet at last OV. Pt to continue Invokamet until next OV (today)  then may stop, depending on A1c result. Consider Flomax for BPH.      Allergies  Allergen Reactions  . Ace Inhibitors Anaphylaxis and Other (See Comments)     Current Outpatient Medications:  .  amLODipine (NORVASC) 10 MG tablet, TAKE 1 TABLET EVERY DAY, Disp: 90 tablet, Rfl: 3 .  atorvastatin (LIPITOR) 40 MG tablet, Take 1 tablet (40 mg total) by mouth daily., Disp: 90 tablet, Rfl: 3 .  carvedilol (COREG) 6.25 MG tablet, Take 1 tablet (6.25 mg total) by mouth 2 (two) times daily., Disp: 180 tablet, Rfl: 3 .  Cholecalciferol (VITAMIN D3) 1000 UNITS CAPS, Take 2,000 Units by mouth daily. , Disp: , Rfl:  .   EPINEPHRINE, ANAPHYLAXIS THERAPY AGENTS,, EPIPEN 2-PAK, 0.3MG /0.3ML (Injection Device) - Historical Medication  as directed (0.3 MG/0.3ML) Active, Disp: , Rfl:  .  furosemide (LASIX) 20 MG tablet, Take 1 tablet by mouth every morning., Disp: , Rfl:  .  hydrALAZINE (APRESOLINE) 100 MG tablet, Take 1 tablet by mouth 2 (two) times daily. , Disp: , Rfl:  .  INVOKAMET [phone] MG TABS, TAKE 1 TABLET TWICE DAILY, Disp: 180 tablet, Rfl: 3 .  levothyroxine (SYNTHROID, LEVOTHROID) 88 MCG tablet, TAKE 1 TABLET EVERY DAY, Disp: 90 tablet, Rfl: 3 .  pioglitazone (ACTOS) 30 MG tablet, TAKE 1 TABLET (30 MG TOTAL) BY MOUTH DAILY., Disp: 90 tablet, Rfl: 3  Review of Systems  Constitutional: Negative.   HENT: Negative.   Eyes: Negative.   Respiratory: Negative.   Cardiovascular: Negative.   Gastrointestinal: Negative.   Endocrine: Negative.   Genitourinary: Positive for frequency.  Musculoskeletal: Negative.   Skin: Negative.   Allergic/Immunologic: Negative.   Neurological: Negative.   Hematological: Negative.   Psychiatric/Behavioral: Negative.     Social History   Tobacco Use  . Smoking status: Former Smoker    Last attempt to quit: 07/04/1979    Years since quitting: 38.6  . Smokeless tobacco: Former Systems developer    Quit date: 11/01/1979  Substance Use Topics  . Alcohol use: Yes    Comment: rare   Objective:  BP (!) 122/58 (BP Location: Right Arm, Patient Position: Sitting, Cuff Size: Large)   Pulse (!) 56   Temp 97.8 F (36.6 C) (Oral)   Resp 16   Wt 250 lb (113.4 kg)   BMI 36.92 kg/m  Vitals:   02/22/18 1434  BP: (!) 122/58  Pulse: (!) 56  Resp: 16  Temp: 97.8 F (36.6 C)  TempSrc: Oral  Weight: 250 lb (113.4 kg)     Physical Exam  Constitutional: He is oriented to person, place, and time. He appears well-developed and well-nourished.  HENT:  Head: Normocephalic and atraumatic.  Eyes: No scleral icterus.  Neck: No thyromegaly present.  Cardiovascular: Normal rate, regular  rhythm and normal heart sounds.  Pulmonary/Chest: Effort normal and breath sounds normal.  Abdominal: Soft.  Lymphadenopathy:    He has no cervical adenopathy.  Neurological: He is alert and oriented to person, place, and time.  Skin: Skin is warm and dry.  Psychiatric: He has a normal mood and affect. His behavior is normal. Judgment and thought content normal.        Assessment & Plan:     1. Frequency of urination Likely due to Invokana. - POCT urinalysis dipstick  2. Type 2 diabetes mellitus with hyperglycemia, without long-term current use of insulin (HCC)  - POCT UA - Microalbumin - POCT HgB A1C--7.4 today. 3.Obesity        Richard Cranford Mon, MD  [facility]

## 2018-03-05 DIAGNOSIS — C44319 Basal cell carcinoma of skin of other parts of face: Secondary | ICD-10-CM | POA: Diagnosis not present

## 2018-03-05 DIAGNOSIS — D485 Neoplasm of uncertain behavior of skin: Secondary | ICD-10-CM | POA: Diagnosis not present

## 2018-03-05 DIAGNOSIS — Z85828 Personal history of other malignant neoplasm of skin: Secondary | ICD-10-CM | POA: Diagnosis not present

## 2018-03-05 DIAGNOSIS — L570 Actinic keratosis: Secondary | ICD-10-CM | POA: Diagnosis not present

## 2018-03-05 DIAGNOSIS — L905 Scar conditions and fibrosis of skin: Secondary | ICD-10-CM | POA: Diagnosis not present

## 2018-03-05 DIAGNOSIS — H35031 Hypertensive retinopathy, right eye: Secondary | ICD-10-CM | POA: Diagnosis not present

## 2018-03-05 DIAGNOSIS — D225 Melanocytic nevi of trunk: Secondary | ICD-10-CM | POA: Diagnosis not present

## 2018-03-05 DIAGNOSIS — D0462 Carcinoma in situ of skin of left upper limb, including shoulder: Secondary | ICD-10-CM | POA: Diagnosis not present

## 2018-03-05 DIAGNOSIS — D2261 Melanocytic nevi of right upper limb, including shoulder: Secondary | ICD-10-CM | POA: Diagnosis not present

## 2018-03-05 DIAGNOSIS — D2262 Melanocytic nevi of left upper limb, including shoulder: Secondary | ICD-10-CM | POA: Diagnosis not present

## 2018-03-05 DIAGNOSIS — X32XXXA Exposure to sunlight, initial encounter: Secondary | ICD-10-CM | POA: Diagnosis not present

## 2018-03-05 DIAGNOSIS — Z08 Encounter for follow-up examination after completed treatment for malignant neoplasm: Secondary | ICD-10-CM | POA: Diagnosis not present

## 2018-03-05 LAB — HM DIABETES EYE EXAM

## 2018-05-09 DIAGNOSIS — C44319 Basal cell carcinoma of skin of other parts of face: Secondary | ICD-10-CM | POA: Diagnosis not present

## 2018-05-14 ENCOUNTER — Other Ambulatory Visit: Payer: Self-pay | Admitting: Family Medicine

## 2018-05-14 DIAGNOSIS — E1142 Type 2 diabetes mellitus with diabetic polyneuropathy: Secondary | ICD-10-CM

## 2018-05-17 DIAGNOSIS — D0462 Carcinoma in situ of skin of left upper limb, including shoulder: Secondary | ICD-10-CM | POA: Diagnosis not present

## 2018-05-24 ENCOUNTER — Encounter: Payer: Self-pay | Admitting: Family Medicine

## 2018-05-24 ENCOUNTER — Ambulatory Visit (INDEPENDENT_AMBULATORY_CARE_PROVIDER_SITE_OTHER): Payer: Medicare Other | Admitting: Family Medicine

## 2018-05-24 VITALS — BP 130/62 | HR 52 | Temp 98.1°F | Resp 16 | Wt 250.0 lb

## 2018-05-24 DIAGNOSIS — E7849 Other hyperlipidemia: Secondary | ICD-10-CM

## 2018-05-24 DIAGNOSIS — I10 Essential (primary) hypertension: Secondary | ICD-10-CM | POA: Diagnosis not present

## 2018-05-24 DIAGNOSIS — Z6838 Body mass index (BMI) 38.0-38.9, adult: Secondary | ICD-10-CM

## 2018-05-24 DIAGNOSIS — E1165 Type 2 diabetes mellitus with hyperglycemia: Secondary | ICD-10-CM

## 2018-05-24 DIAGNOSIS — E039 Hypothyroidism, unspecified: Secondary | ICD-10-CM | POA: Diagnosis not present

## 2018-05-24 LAB — POCT GLYCOSYLATED HEMOGLOBIN (HGB A1C): Hemoglobin A1C: 7.2 % — AB (ref 4.0–5.6)

## 2018-05-24 MED ORDER — METFORMIN HCL 1000 MG PO TABS: 1000.0000 mg | ORAL_TABLET | Freq: Two times a day (BID) | ORAL | 0 refills | Status: DC

## 2018-05-24 MED ORDER — METFORMIN HCL 1000 MG PO TABS: 1000.0000 mg | ORAL_TABLET | Freq: Two times a day (BID) | ORAL | 3 refills | Status: DC

## 2018-05-24 NOTE — Progress Notes (Signed)
Patient: Zachary Martinez Male    DOB: 12/25/1940   76 y.o.   MRN: [id number] Visit Date: 05/24/2018  Today's Provider: [name], MD   Chief Complaint  Patient presents with  . Diabetes  . Urinary Frequency   Subjective:    HPI  Diabetes Mellitus Type II, Follow-up:   Lab Results  Component Value Date   HGBA1C 7.2 (A) 05/24/2018   HGBA1C 7.4 02/22/2018   HGBA1C 7.2 10/26/2017    Last seen for diabetes 3 months ago.  Management since then includes no changes. He reports good compliance with treatment. He is not having side effects.  Current symptoms include none and have been stable. Home blood sugar records: fasting range: 120s  Episodes of hypoglycemia? no   Current Insulin Regimen: none Most Recent Eye Exam: 02/2018 Weight trend: stable Prior visit with dietician: no Current diet: well balanced Current exercise: no regular exercise  Pertinent Labs:    Component Value Date/Time   CHOL 113 06/20/2017 0802   TRIG 240 (H) 06/20/2017 0802   HDL 32 (L) 06/20/2017 0802   LDLCALC 33 06/20/2017 0802   CREATININE 0.91 06/20/2017 0802   CREATININE 1.26 12/06/2012 0426    Wt Readings from Last 3 Encounters:  05/24/18 250 lb (113.4 kg)  02/22/18 250 lb (113.4 kg)  10/26/17 259 lb (117.5 kg)      Urinary frequency: Patient reports that he still has urinary frequency. On last visit, it was suggested that it could be due to the invokana. Patient reports that instead of taking 2 tablets daily, he has been taking 1 tablet a day.   All other issues stable. Allergies  Allergen Reactions  . Ace Inhibitors Anaphylaxis and Other (See Comments)     Current Outpatient Medications:  .  amLODipine (NORVASC) 10 MG tablet, TAKE 1 TABLET EVERY DAY, Disp: 90 tablet, Rfl: 3 .  atorvastatin (LIPITOR) 40 MG tablet, Take 1 tablet (40 mg total) by mouth daily., Disp: 90 tablet, Rfl: 3 .  carvedilol (COREG) 6.25 MG tablet, Take 1 tablet (6.25 mg total) by mouth 2  (two) times daily., Disp: 180 tablet, Rfl: 3 .  Cholecalciferol (VITAMIN D3) 1000 UNITS CAPS, Take 2,000 Units by mouth daily. , Disp: , Rfl:  .  EPINEPHRINE, ANAPHYLAXIS THERAPY AGENTS,, EPIPEN 2-PAK, 0.3MG /0.3ML (Injection Device) - Historical Medication  as directed (0.3 MG/0.3ML) Active, Disp: , Rfl:  .  furosemide (LASIX) 20 MG tablet, Take 1 tablet by mouth every morning., Disp: , Rfl:  .  hydrALAZINE (APRESOLINE) 100 MG tablet, Take 1 tablet by mouth 2 (two) times daily. , Disp: , Rfl:  .  INVOKAMET [phone] MG TABS, TAKE 1 TABLET TWICE DAILY, Disp: 180 tablet, Rfl: 3 .  levothyroxine (SYNTHROID, LEVOTHROID) 88 MCG tablet, TAKE 1 TABLET EVERY DAY, Disp: 90 tablet, Rfl: 3 .  pioglitazone (ACTOS) 30 MG tablet, TAKE 1 TABLET EVERY DAY, Disp: 90 tablet, Rfl: 3  Review of Systems  Constitutional: Negative for activity change, appetite change, chills, diaphoresis, fatigue, fever and unexpected weight change.  Respiratory: Negative for cough and shortness of breath.   Cardiovascular: Negative for chest pain, palpitations and leg swelling.  Gastrointestinal: Negative.   Endocrine: Negative for cold intolerance, heat intolerance, polydipsia, polyphagia and polyuria.  Genitourinary: Positive for frequency. Negative for decreased urine volume, difficulty urinating, dysuria, penile swelling, scrotal swelling and urgency.  Musculoskeletal: Negative for arthralgias and myalgias.  Neurological: Negative for dizziness, weakness, light-headedness and headaches.  Social History   Tobacco Use  . Smoking status: Former Smoker    Last attempt to quit: 07/04/1979    Years since quitting: 38.9  . Smokeless tobacco: Former Systems developer    Quit date: 11/01/1979  Substance Use Topics  . Alcohol use: Yes    Comment: rare   Objective:   BP 130/62 (BP Location: Right Arm, Patient Position: Sitting, Cuff Size: Normal)   Pulse (!) 52   Temp 98.1 F (36.7 C)   Resp 16   Wt 250 lb (113.4 kg)   SpO2 97%   BMI  36.92 kg/m  Vitals:   05/24/18 0900  BP: 130/62  Pulse: (!) 52  Resp: 16  Temp: 98.1 F (36.7 C)  SpO2: 97%  Weight: 250 lb (113.4 kg)     Physical Exam  Constitutional: He is oriented to person, place, and time. He appears well-developed and well-nourished.  HENT:  Head: Normocephalic and atraumatic.  Right Ear: External ear normal.  Left Ear: External ear normal.  Nose: Nose normal.  Eyes: Conjunctivae are normal. No scleral icterus.  Neck: No thyromegaly present.  Cardiovascular: Normal rate, regular rhythm and normal heart sounds.  Pulmonary/Chest: Effort normal and breath sounds normal.  Abdominal: Soft.  Musculoskeletal: He exhibits no edema.  Neurological: He is alert and oriented to person, place, and time.  Skin: Skin is warm and dry.  Psychiatric: He has a normal mood and affect. His behavior is normal. Judgment and thought content normal.        Assessment & Plan:     1. Type 2 diabetes mellitus with hyperglycemia, without long-term current use of insulin ([employer]) Stop Invokana due to urine side effect. - POCT glycosylated hemoglobin (Hb A1C) - Hemoglobin A1c - metFORMIN (GLUCOPHAGE) 1000 MG tablet; Take 1 tablet (1,000 mg total) by mouth 2 (two) times daily with a meal.  Dispense: 30 tablet; Refill: 0 - metFORMIN (GLUCOPHAGE) 1000 MG tablet; Take 1 tablet (1,000 mg total) by mouth 2 (two) times daily with a meal.  Dispense: 180 tablet; Refill: 3  2. Essential hypertension  - CBC with Differential/Platelet - Comprehensive metabolic panel  3. Adult hypothyroidism  - TSH  4. Other hyperlipidemia  - Lipid Panel With LDL/HDL Ratio      I have done the exam and reviewed the above chart and it is accurate to the best of my knowledge. Development worker, community has been used in this note in any air is in the dictation or transcription are unintentional.  [name], MD  [place]

## 2018-05-25 DIAGNOSIS — E7849 Other hyperlipidemia: Secondary | ICD-10-CM | POA: Diagnosis not present

## 2018-05-25 DIAGNOSIS — E039 Hypothyroidism, unspecified: Secondary | ICD-10-CM | POA: Diagnosis not present

## 2018-05-25 DIAGNOSIS — I10 Essential (primary) hypertension: Secondary | ICD-10-CM | POA: Diagnosis not present

## 2018-05-25 DIAGNOSIS — E1165 Type 2 diabetes mellitus with hyperglycemia: Secondary | ICD-10-CM | POA: Diagnosis not present

## 2018-05-26 LAB — CBC WITH DIFFERENTIAL/PLATELET
BASOS: 1 %
Basophils Absolute: 0 10*3/uL (ref 0.0–0.2)
EOS (ABSOLUTE): 0.1 10*3/uL (ref 0.0–0.4)
Eos: 3 %
Hematocrit: 42.2 % (ref 37.5–51.0)
Hemoglobin: 13.7 g/dL (ref 13.0–17.7)
IMMATURE GRANS (ABS): 0 10*3/uL (ref 0.0–0.1)
IMMATURE GRANULOCYTES: 0 %
LYMPHS: 25 %
Lymphocytes Absolute: 1 10*3/uL (ref 0.7–3.1)
MCH: 30.6 pg (ref 26.6–33.0)
MCHC: 32.5 g/dL (ref 31.5–35.7)
MCV: 94 fL (ref 79–97)
MONOS ABS: 0.3 10*3/uL (ref 0.1–0.9)
Monocytes: 8 %
NEUTROS PCT: 63 %
Neutrophils Absolute: 2.5 10*3/uL (ref 1.4–7.0)
PLATELETS: 158 10*3/uL (ref 150–450)
RBC: 4.48 x10E6/uL (ref 4.14–5.80)
RDW: 15 % (ref 12.3–15.4)
WBC: 4 10*3/uL (ref 3.4–10.8)

## 2018-05-26 LAB — TSH: TSH: 3.97 u[IU]/mL (ref 0.450–4.500)

## 2018-05-26 LAB — LIPID PANEL WITH LDL/HDL RATIO
CHOLESTEROL TOTAL: 124 mg/dL (ref 100–199)
HDL: 29 mg/dL — AB (ref >39)
LDL CALC: 45 mg/dL (ref 0–99)
LDl/HDL Ratio: 1.6 ratio (ref 0.0–3.6)
TRIGLYCERIDES: 249 mg/dL — AB (ref 0–149)
VLDL Cholesterol Cal: 50 mg/dL — ABNORMAL HIGH (ref 5–40)

## 2018-05-26 LAB — COMPREHENSIVE METABOLIC PANEL
ALT: 18 IU/L (ref 0–44)
AST: 15 IU/L (ref 0–40)
Albumin/Globulin Ratio: 1.9 (ref 1.2–2.2)
Albumin: 3.9 g/dL (ref 3.5–4.8)
Alkaline Phosphatase: 83 IU/L (ref 39–117)
BUN/Creatinine Ratio: 24 (ref 10–24)
BUN: 22 mg/dL (ref 8–27)
Bilirubin Total: 0.4 mg/dL (ref 0.0–1.2)
CALCIUM: 10.3 mg/dL — AB (ref 8.6–10.2)
CO2: 21 mmol/L (ref 20–29)
CREATININE: 0.9 mg/dL (ref 0.76–1.27)
Chloride: 106 mmol/L (ref 96–106)
GFR calc Af Amer: 96 mL/min/{1.73_m2} (ref >59)
GFR, EST NON AFRICAN AMERICAN: 83 mL/min/{1.73_m2} (ref >59)
GLUCOSE: 177 mg/dL — AB (ref 65–99)
Globulin, Total: 2.1 g/dL (ref 1.5–4.5)
POTASSIUM: 4.5 mmol/L (ref 3.5–5.2)
Sodium: 141 mmol/L (ref 134–144)
TOTAL PROTEIN: 6 g/dL (ref 6.0–8.5)

## 2018-05-26 LAB — HEMOGLOBIN A1C
ESTIMATED AVERAGE GLUCOSE: 157 mg/dL
Hgb A1c MFr Bld: 7.1 % — ABNORMAL HIGH (ref 4.8–5.6)

## 2018-06-26 ENCOUNTER — Telehealth: Payer: Self-pay | Admitting: Family Medicine

## 2018-06-26 ENCOUNTER — Ambulatory Visit (INDEPENDENT_AMBULATORY_CARE_PROVIDER_SITE_OTHER): Payer: Medicare Other | Admitting: Family Medicine

## 2018-06-26 VITALS — BP 170/68 | HR 57 | Temp 98.0°F | Resp 16 | Wt 258.0 lb

## 2018-06-26 DIAGNOSIS — I2510 Atherosclerotic heart disease of native coronary artery without angina pectoris: Secondary | ICD-10-CM

## 2018-06-26 DIAGNOSIS — I10 Essential (primary) hypertension: Secondary | ICD-10-CM

## 2018-06-26 DIAGNOSIS — G4733 Obstructive sleep apnea (adult) (pediatric): Secondary | ICD-10-CM | POA: Diagnosis not present

## 2018-06-26 DIAGNOSIS — I872 Venous insufficiency (chronic) (peripheral): Secondary | ICD-10-CM

## 2018-06-26 NOTE — Patient Instructions (Signed)
Ice to rib area in pain for 2 days then heat (low) Topical Capsaicin to area.  Use support hose for swelling

## 2018-06-26 NOTE — Telephone Encounter (Signed)
Wife wanted to get x-rays prior to visit.  Explained that we would need to see him in order to know what we need to x-ray.

## 2018-06-26 NOTE — Progress Notes (Signed)
Zachary Martinez  MRN: [id number] DOB: 10/22/1941  Subjective:  HPI   The patient is a 77 year old male who presents with complaint of pain in his right rib/mid axillary area.  He states he was under his trailer drilling when he heard and felt a pop. He has been taken Tylenol without relief.  He state it is worse with breathing and movement.  He also states that he needs to have a new CPAP machine ordered.  He uses it nightly and states he does very well and has good symptom relief.  Patient is also having swelling of his lower extremities bilaterally.  He said it is at it's worse at night and then in the morning it goes down.  Patient Active Problem List   Diagnosis Date Noted  . Venous insufficiency of both lower extremities 06/02/2017  . Hypertension 06/07/2016  . Type 2 diabetes mellitus ([place]) 10/08/2015  . Abnormal chest x-ray 05/09/2015  . CAD in native artery 05/09/2015  . Benign fibroma of prostate 05/09/2015  . Arteriosclerosis of coronary artery 05/09/2015  . Diabetes mellitus with polyneuropathy ([place]) 05/09/2015  . Diverticulosis of colon 05/09/2015  . ED (erectile dysfunction) of organic origin 05/09/2015  . Family history of colon cancer 05/09/2015  . HLD (hyperlipidemia) 05/09/2015  . Adult hypothyroidism 05/09/2015  . Adiposity 05/09/2015  . Arthritis, degenerative 05/09/2015  . Parathyroid adenoma 05/09/2015  . Apnea, sleep 05/09/2015  . Disorder of bursae and tendons in shoulder region 12/22/2014  . Hypertensive left ventricular hypertrophy 11/24/2014  . MI (mitral incompetence) 11/24/2014  . Obstructive apnea 11/24/2014    Past Medical History:  Diagnosis Date  . CAD (coronary artery disease)   . Diabetes ([place])   . Hyperlipidemia   . Hypertension   . Hypothyroidism   . Sleep apnea    cpap    Social History   Socioeconomic History  . Marital status: Married    Spouse name: Not on file  . Number of children: 3  . Years of education: Not on file  .  Highest education level: Associate degree: occupational, Hotel manager, or vocational program  Occupational History  . Occupation: retired  Scientific laboratory technician  . Financial resource strain: Not hard at all  . Food insecurity:    Worry: Never true    Inability: Never true  . Transportation needs:    Medical: No    Non-medical: No  Tobacco Use  . Smoking status: Former Smoker    Last attempt to quit: 07/04/1979    Years since quitting: 39.0  . Smokeless tobacco: Former Systems developer    Quit date: 11/01/1979  Substance and Sexual Activity  . Alcohol use: Yes    Comment: rare  . Drug use: No  . Sexual activity: Not on file  Lifestyle  . Physical activity:    Days per week: Not on file    Minutes per session: Not on file  . Stress: Not at all  Relationships  . Social connections:    Talks on phone: Not on file    Gets together: Not on file    Attends religious service: Not on file    Active member of club or organization: Not on file    Attends meetings of clubs or organizations: Not on file    Relationship status: Not on file  . Intimate partner violence:    Fear of current or ex partner: No    Emotionally abused: No    Physically abused: No  Forced sexual activity: No  Other Topics Concern  . Not on file  Social History Narrative  . Not on file    Outpatient Encounter Medications as of 06/26/2018  Medication Sig  . amLODipine (NORVASC) 10 MG tablet TAKE 1 TABLET EVERY DAY  . atorvastatin (LIPITOR) 40 MG tablet Take 1 tablet (40 mg total) by mouth daily.  . carvedilol (COREG) 6.25 MG tablet Take 1 tablet (6.25 mg total) by mouth 2 (two) times daily.  . Cholecalciferol (VITAMIN D3) 1000 UNITS CAPS Take 2,000 Units by mouth daily.   Marland Kitchen EPINEPHRINE, ANAPHYLAXIS THERAPY AGENTS, EPIPEN 2-PAK, 0.3MG /0.3ML (Injection Device) - Historical Medication  as directed (0.3 MG/0.3ML) Active  . furosemide (LASIX) 20 MG tablet Take 1 tablet by mouth every morning.  . hydrALAZINE (APRESOLINE) 100 MG tablet  Take 1 tablet by mouth 2 (two) times daily.   Marland Kitchen levothyroxine (SYNTHROID, LEVOTHROID) 88 MCG tablet TAKE 1 TABLET EVERY DAY  . metFORMIN (GLUCOPHAGE) 1000 MG tablet Take 1 tablet (1,000 mg total) by mouth 2 (two) times daily with a meal.  . pioglitazone (ACTOS) 30 MG tablet TAKE 1 TABLET EVERY DAY  . [DISCONTINUED] metFORMIN (GLUCOPHAGE) 1000 MG tablet Take 1 tablet (1,000 mg total) by mouth 2 (two) times daily with a meal.   No facility-administered encounter medications on file as of 06/26/2018.     Allergies  Allergen Reactions  . Ace Inhibitors Anaphylaxis and Other (See Comments)    Review of Systems  Constitutional: Negative for fever and malaise/fatigue.  Eyes: Negative.   Respiratory: Positive for shortness of breath (shallow breathing secondary to pain). Negative for cough.   Cardiovascular: Positive for leg swelling. Negative for chest pain and palpitations.  Gastrointestinal: Negative.   Endo/Heme/Allergies: Negative.   Psychiatric/Behavioral: Negative.     Objective:  BP (!) 170/68 (BP Location: Right Arm, Patient Position: Sitting, Cuff Size: Normal)   Pulse (!) 57   Temp 98 F (36.7 C) (Oral)   Resp 16   Wt 258 lb (117 kg)   BMI 38.10 kg/m   Physical Exam  Constitutional: He is oriented to person, place, and time and well-developed, well-nourished, and in no distress.  HENT:  Head: Normocephalic and atraumatic.  Right Ear: External ear normal.  Left Ear: External ear normal.  Nose: Nose normal.  Eyes: Conjunctivae are normal. No scleral icterus.  Neck: No thyromegaly present.  Cardiovascular: Normal rate, regular rhythm and normal heart sounds.  Pulmonary/Chest: Effort normal and breath sounds normal.  Abdominal: Soft.  Musculoskeletal: He exhibits edema.  1+ edema. Mild tenderness without crepitance of right ribs.  Neurological: He is alert and oriented to person, place, and time. Gait normal. GCS score is 15.  Skin: Skin is warm and dry.  Psychiatric:  Mood, memory, affect and judgment normal.  O2 sat is 94%.  Assessment and Plan :  OSA Using CPAP nightly. Venous Stasis Leg Changes/Dependent edema Start with compression hose--may need vascular referral. Rib strain Use ice/heat and topical capsaicin. Obesity CAD TIIDM

## 2018-06-26 NOTE — Telephone Encounter (Signed)
Pt's wife Jana Half is requesting Jiles Garter to return her call. Jana Half would like to discuss an issue that pt is having with possible pulled muscle. Pt is scheduled for appt with Dr. Rosanna Randy today @ 140 pm but Jana Half request Jiles Garter return her call. Please advise. Thanks TNP

## 2018-08-15 DIAGNOSIS — Z23 Encounter for immunization: Secondary | ICD-10-CM | POA: Diagnosis not present

## 2018-08-23 ENCOUNTER — Encounter: Payer: Medicare Other | Admitting: Family Medicine

## 2018-08-23 NOTE — Progress Notes (Deleted)
       Patient: Zachary Martinez Male    DOB: 1941-04-01   77 y.o.   MRN: [id number] Visit Date: 08/23/2018  Today's Provider: [name], MD   No chief complaint on file.  Subjective:    HPI  Diabetes Mellitus Type II, Follow-up:   Lab Results  Component Value Date   HGBA1C 7.1 (H) 05/25/2018   HGBA1C 7.2 (A) 05/24/2018   HGBA1C 7.4 02/22/2018    Last seen for diabetes {1-12:18279} {days/wks/mos/yrs:310907} ago.  Management since then includes ***. He reports {excellent/good/fair/poor:19665} compliance with treatment. He {ACTION; IS/IS KGM:01027253} having side effects. *** Current symptoms include {Symptoms; diabetes:14075} and have been {Desc; course:15616}. Home blood sugar records: {diabetes glucometry results:16657}  Episodes of hypoglycemia? {yes***/no:17258}   Current Insulin Regimen: *** Most Recent Eye Exam: *** Weight trend: {trend:16658} Prior visit with dietician: {yes/no:17258} Current diet: {diet habits:16563} Current exercise: {exercise types:16438}  Pertinent Labs:    Component Value Date/Time   CHOL 124 05/25/2018 0803   TRIG 249 (H) 05/25/2018 0803   HDL 29 (L) 05/25/2018 0803   LDLCALC 45 05/25/2018 0803   CREATININE 0.90 05/25/2018 0803   CREATININE 1.26 12/06/2012 0426    Wt Readings from Last 3 Encounters:  06/26/18 258 lb (117 kg)  05/24/18 250 lb (113.4 kg)  02/22/18 250 lb (113.4 kg)      Allergies  Allergen Reactions  . Ace Inhibitors Anaphylaxis and Other (See Comments)     Current Outpatient Medications:  .  amLODipine (NORVASC) 10 MG tablet, TAKE 1 TABLET EVERY DAY, Disp: 90 tablet, Rfl: 3 .  atorvastatin (LIPITOR) 40 MG tablet, Take 1 tablet (40 mg total) by mouth daily., Disp: 90 tablet, Rfl: 3 .  carvedilol (COREG) 6.25 MG tablet, Take 1 tablet (6.25 mg total) by mouth 2 (two) times daily., Disp: 180 tablet, Rfl: 3 .  Cholecalciferol (VITAMIN D3) 1000 UNITS CAPS, Take 2,000 Units by mouth daily. , Disp: , Rfl:  .   EPINEPHRINE, ANAPHYLAXIS THERAPY AGENTS,, EPIPEN 2-PAK, 0.3MG /0.3ML (Injection Device) - Historical Medication  as directed (0.3 MG/0.3ML) Active, Disp: , Rfl:  .  furosemide (LASIX) 20 MG tablet, Take 1 tablet by mouth every morning., Disp: , Rfl:  .  hydrALAZINE (APRESOLINE) 100 MG tablet, Take 1 tablet by mouth 2 (two) times daily. , Disp: , Rfl:  .  levothyroxine (SYNTHROID, LEVOTHROID) 88 MCG tablet, TAKE 1 TABLET EVERY DAY, Disp: 90 tablet, Rfl: 3 .  metFORMIN (GLUCOPHAGE) 1000 MG tablet, Take 1 tablet (1,000 mg total) by mouth 2 (two) times daily with a meal., Disp: 180 tablet, Rfl: 3 .  pioglitazone (ACTOS) 30 MG tablet, TAKE 1 TABLET EVERY DAY, Disp: 90 tablet, Rfl: 3  Review of Systems  Social History   Tobacco Use  . Smoking status: Former Smoker    Last attempt to quit: 07/04/1979    Years since quitting: 39.1  . Smokeless tobacco: Former Systems developer    Quit date: 11/01/1979  Substance Use Topics  . Alcohol use: Yes    Comment: rare   Objective:   There were no vitals taken for this visit. There were no vitals filed for this visit.   Physical Exam      Assessment & Plan:           [name], MD  [facility]

## 2018-08-27 ENCOUNTER — Ambulatory Visit (INDEPENDENT_AMBULATORY_CARE_PROVIDER_SITE_OTHER): Payer: Medicare Other | Admitting: Family Medicine

## 2018-08-27 ENCOUNTER — Encounter: Payer: Self-pay | Admitting: Family Medicine

## 2018-08-27 VITALS — BP 132/72 | HR 56 | Temp 98.1°F | Resp 16 | Wt 261.0 lb

## 2018-08-27 DIAGNOSIS — E1165 Type 2 diabetes mellitus with hyperglycemia: Secondary | ICD-10-CM

## 2018-08-27 DIAGNOSIS — Z6838 Body mass index (BMI) 38.0-38.9, adult: Secondary | ICD-10-CM

## 2018-08-27 DIAGNOSIS — I2510 Atherosclerotic heart disease of native coronary artery without angina pectoris: Secondary | ICD-10-CM

## 2018-08-27 DIAGNOSIS — E7849 Other hyperlipidemia: Secondary | ICD-10-CM | POA: Diagnosis not present

## 2018-08-27 DIAGNOSIS — G4733 Obstructive sleep apnea (adult) (pediatric): Secondary | ICD-10-CM | POA: Diagnosis not present

## 2018-08-27 LAB — POCT GLYCOSYLATED HEMOGLOBIN (HGB A1C): Hemoglobin A1C: 7.8 % — AB (ref 4.0–5.6)

## 2018-08-27 NOTE — Progress Notes (Addendum)
Patient: Zachary Martinez Male    DOB: 1941/07/24   77 y.o.   MRN: [id number] Visit Date: 08/27/2018  Today's Provider: [name], MD   Chief Complaint  Patient presents with  . Diabetes  . Hypertension   Subjective:    HPI  Diabetes Mellitus Type II, Follow-up:   Lab Results  Component Value Date   HGBA1C 7.8 (A) 08/27/2018   HGBA1C 7.1 (H) 05/25/2018   HGBA1C 7.2 (A) 05/24/2018    Last seen for diabetes 3 months ago.  Management since then includes changing back to Metformin from [place]. He reports good compliance with treatment. He is not having side effects.  Current symptoms include none and have been stable. Home blood sugar records: trend: fluctuating a bit  Episodes of hypoglycemia? no   Current Insulin Regimen: none Most Recent Eye Exam: up to date Weight trend: stable Prior visit with dietician: no Current diet: well balanced Current exercise: no regular exercise   Patient has history of snoring and witnessed apnea and has been on CPAP.  He feels better on this.  Pertinent Labs:    Component Value Date/Time   CHOL 124 05/25/2018 0803   TRIG 249 (H) 05/25/2018 0803   HDL 29 (L) 05/25/2018 0803   LDLCALC 45 05/25/2018 0803   CREATININE 0.90 05/25/2018 0803   CREATININE 1.26 12/06/2012 0426    Wt Readings from Last 3 Encounters:  08/27/18 261 lb (118.4 kg)  06/26/18 258 lb (117 kg)  05/24/18 250 lb (113.4 kg)       Hypertension, follow-up:  BP Readings from Last 3 Encounters:  08/27/18 132/72  06/26/18 (!) 170/68  05/24/18 130/62    He was last seen for hypertension 3 months ago.  BP at that visit was 130/62. Management since that visit includes no changes. He reports good compliance with treatment. He is not having side effects.  He is not exercising. He is adherent to low salt diet.   Outside blood pressures are checked occasionally. He is experiencing none.  Patient denies chest pressure/discomfort, exertional  chest pressure/discomfort and lower extremity edema.      Allergies  Allergen Reactions  . Ace Inhibitors Anaphylaxis and Other (See Comments)     Current Outpatient Medications:  .  amLODipine (NORVASC) 10 MG tablet, TAKE 1 TABLET EVERY DAY, Disp: 90 tablet, Rfl: 3 .  atorvastatin (LIPITOR) 40 MG tablet, Take 1 tablet (40 mg total) by mouth daily., Disp: 90 tablet, Rfl: 3 .  carvedilol (COREG) 6.25 MG tablet, Take 1 tablet (6.25 mg total) by mouth 2 (two) times daily., Disp: 180 tablet, Rfl: 3 .  Cholecalciferol (VITAMIN D3) 1000 UNITS CAPS, Take 2,000 Units by mouth daily. , Disp: , Rfl:  .  EPINEPHRINE, ANAPHYLAXIS THERAPY AGENTS,, EPIPEN 2-PAK, 0.3MG /0.3ML (Injection Device) - Historical Medication  as directed (0.3 MG/0.3ML) Active, Disp: , Rfl:  .  furosemide (LASIX) 20 MG tablet, Take 1 tablet by mouth every morning., Disp: , Rfl:  .  hydrALAZINE (APRESOLINE) 100 MG tablet, Take 1 tablet by mouth 2 (two) times daily. , Disp: , Rfl:  .  levothyroxine (SYNTHROID, LEVOTHROID) 88 MCG tablet, TAKE 1 TABLET EVERY DAY, Disp: 90 tablet, Rfl: 3 .  metFORMIN (GLUCOPHAGE) 1000 MG tablet, Take 1 tablet (1,000 mg total) by mouth 2 (two) times daily with a meal., Disp: 180 tablet, Rfl: 3 .  pioglitazone (ACTOS) 30 MG tablet, TAKE 1 TABLET EVERY DAY, Disp: 90 tablet, Rfl: 3  Review  of Systems  Constitutional: Negative.   Respiratory: Negative.   Cardiovascular: Negative.   Endocrine: Negative.   Musculoskeletal: Negative.   Allergic/Immunologic: Negative.   Neurological: Negative.   Psychiatric/Behavioral: Negative.     Social History   Tobacco Use  . Smoking status: Former Smoker    Last attempt to quit: 07/04/1979    Years since quitting: 39.1  . Smokeless tobacco: Former Systems developer    Quit date: 11/01/1979  Substance Use Topics  . Alcohol use: Yes    Comment: rare   Objective:   BP 132/72 (BP Location: Right Arm, Patient Position: Sitting, Cuff Size: Large)   Pulse (!) 56   Temp 98.1  F (36.7 C)   Resp 16   Wt 261 lb (118.4 kg)   SpO2 97%   BMI 38.54 kg/m  Vitals:   08/27/18 0811  BP: 132/72  Pulse: (!) 56  Resp: 16  Temp: 98.1 F (36.7 C)  SpO2: 97%  Weight: 261 lb (118.4 kg)     Physical Exam  Constitutional: He is oriented to person, place, and time. He appears well-developed and well-nourished.  HENT:  Head: Normocephalic and atraumatic.  Right Ear: External ear normal.  Left Ear: External ear normal.  Nose: Nose normal.  Eyes: Conjunctivae are normal.  Neck: No thyromegaly present.  Cardiovascular: Normal rate, regular rhythm and normal heart sounds.  Pulmonary/Chest: Effort normal and breath sounds normal.  Abdominal: Soft.  Musculoskeletal: He exhibits edema.  1+ with support hose.  Neurological: He is alert and oriented to person, place, and time.  Skin: Skin is warm and dry.  Psychiatric: He has a normal mood and affect. His behavior is normal. Judgment and thought content normal.        Assessment & Plan:     1. Type 2 diabetes mellitus with hyperglycemia, without long-term current use of insulin (HCC) Goal A1C of less than 7.5. Work on lifestyle. - POCT glycosylated hemoglobin (Hb A1C)--7.8 toda  2. Obstructive apnea Patient has a history of known sleep apnea and feels better on CPAP titration.  He uses CPAP nightly. - Cpap titration; Future  3. CAD in native artery   4. Class 2 severe obesity due to excess calories with serious comorbidity and body mass index (BMI) of 38.0 to 38.9 in adult [facility]) Lifestyle stressed.  5. Other hyperlipidemia       I have done the exam and reviewed the above chart and it is accurate to the best of my knowledge. Development worker, community has been used in this note in any air is in the dictation or transcription are unintentional.  [name], MD  [place],

## 2018-08-31 DIAGNOSIS — M1711 Unilateral primary osteoarthritis, right knee: Secondary | ICD-10-CM | POA: Diagnosis not present

## 2018-09-03 DIAGNOSIS — E113391 Type 2 diabetes mellitus with moderate nonproliferative diabetic retinopathy without macular edema, right eye: Secondary | ICD-10-CM | POA: Diagnosis not present

## 2018-09-18 ENCOUNTER — Other Ambulatory Visit: Payer: Self-pay

## 2018-09-18 DIAGNOSIS — D2261 Melanocytic nevi of right upper limb, including shoulder: Secondary | ICD-10-CM | POA: Diagnosis not present

## 2018-09-18 DIAGNOSIS — Z08 Encounter for follow-up examination after completed treatment for malignant neoplasm: Secondary | ICD-10-CM | POA: Diagnosis not present

## 2018-09-18 DIAGNOSIS — L570 Actinic keratosis: Secondary | ICD-10-CM | POA: Diagnosis not present

## 2018-09-18 DIAGNOSIS — D225 Melanocytic nevi of trunk: Secondary | ICD-10-CM | POA: Diagnosis not present

## 2018-09-18 DIAGNOSIS — X32XXXA Exposure to sunlight, initial encounter: Secondary | ICD-10-CM | POA: Diagnosis not present

## 2018-09-18 DIAGNOSIS — Z85828 Personal history of other malignant neoplasm of skin: Secondary | ICD-10-CM | POA: Diagnosis not present

## 2018-09-18 DIAGNOSIS — D2262 Melanocytic nevi of left upper limb, including shoulder: Secondary | ICD-10-CM | POA: Diagnosis not present

## 2018-09-18 MED ORDER — OSELTAMIVIR PHOSPHATE 75 MG PO CAPS: 75.0000 mg | ORAL_CAPSULE | Freq: Every day | ORAL | 0 refills | Status: AC

## 2018-09-18 NOTE — Progress Notes (Signed)
Prophylaxis for patient per Dr. Darnell Level.

## 2018-10-04 ENCOUNTER — Telehealth: Payer: Self-pay

## 2018-10-04 NOTE — Telephone Encounter (Signed)
LMTCB and schedule AWV. Needs to be completed anytime after 10/20/18. -MM

## 2018-10-10 ENCOUNTER — Other Ambulatory Visit: Payer: Self-pay | Admitting: Family Medicine

## 2018-10-10 DIAGNOSIS — I10 Essential (primary) hypertension: Secondary | ICD-10-CM

## 2018-10-22 ENCOUNTER — Ambulatory Visit (INDEPENDENT_AMBULATORY_CARE_PROVIDER_SITE_OTHER): Payer: Medicare Other

## 2018-10-22 VITALS — BP 132/52 | HR 66 | Temp 98.2°F | Ht 69.0 in | Wt 261.0 lb

## 2018-10-22 DIAGNOSIS — Z0000 Encounter for general adult medical examination without abnormal findings: Secondary | ICD-10-CM | POA: Diagnosis not present

## 2018-10-22 NOTE — Patient Instructions (Signed)
Mr. Zachary Martinez , Thank you for taking time to come for your Medicare Wellness Visit. I appreciate your ongoing commitment to your health goals. Please review the following plan we discussed and let me know if I can assist you in the future.   Screening recommendations/referrals: Colonoscopy: Up to date, due 08/2019 Recommended yearly ophthalmology/optometry visit for glaucoma screening and checkup Recommended yearly dental visit for hygiene and checkup  Vaccinations: Influenza vaccine: Up to date Pneumococcal vaccine: Completed series Tdap vaccine: Up to date, due 12/2024 Shingles vaccine: Pt declines today.     Advanced directives: Already on file.   Conditions/risks identified: Obesity- continue controlling diet. Eat smaller portions sizes and eat 3 small meals a day with 2 healthy snack in between.   Next appointment: 12/27/18 with Dr Rosanna Randy.   Preventive Care 46 Years and Older, Male Preventive care refers to lifestyle choices and visits with your health care provider that can promote health and wellness. What does preventive care include?  A yearly physical exam. This is also called an annual well check.  Dental exams once or twice a year.  Routine eye exams. Ask your health care provider how often you should have your eyes checked.  Personal lifestyle choices, including:  Daily care of your teeth and gums.  Regular physical activity.  Eating a healthy diet.  Avoiding tobacco and drug use.  Limiting alcohol use.  Practicing safe sex.  Taking low doses of aspirin every day.  Taking vitamin and mineral supplements as recommended by your health care provider. What happens during an annual well check? The services and screenings done by your health care provider during your annual well check will depend on your age, overall health, lifestyle risk factors, and family history of disease. Counseling  Your health care provider may ask you questions about your:  Alcohol  use.  Tobacco use.  Drug use.  Emotional well-being.  Home and relationship well-being.  Sexual activity.  Eating habits.  History of falls.  Memory and ability to understand (cognition).  Work and work Statistician. Screening  You may have the following tests or measurements:  Height, weight, and BMI.  Blood pressure.  Lipid and cholesterol levels. These may be checked every 5 years, or more frequently if you are over 70 years old.  Skin check.  Lung cancer screening. You may have this screening every year starting at age 4 if you have a 30-pack-year history of smoking and currently smoke or have quit within the past 15 years.  Fecal occult blood test (FOBT) of the stool. You may have this test every year starting at age 42.  Flexible sigmoidoscopy or colonoscopy. You may have a sigmoidoscopy every 5 years or a colonoscopy every 10 years starting at age 63.  Prostate cancer screening. Recommendations will vary depending on your family history and other risks.  Hepatitis C blood test.  Hepatitis B blood test.  Sexually transmitted disease (STD) testing.  Diabetes screening. This is done by checking your blood sugar (glucose) after you have not eaten for a while (fasting). You may have this done every 1-3 years.  Abdominal aortic aneurysm (AAA) screening. You may need this if you are a current or former smoker.  Osteoporosis. You may be screened starting at age 80 if you are at high risk. Talk with your health care provider about your test results, treatment options, and if necessary, the need for more tests. Vaccines  Your health care provider may recommend certain vaccines, such as:  Influenza  vaccine. This is recommended every year.  Tetanus, diphtheria, and acellular pertussis (Tdap, Td) vaccine. You may need a Td booster every 10 years.  Zoster vaccine. You may need this after age 64.  Pneumococcal 13-valent conjugate (PCV13) vaccine. One dose is  recommended after age 80.  Pneumococcal polysaccharide (PPSV23) vaccine. One dose is recommended after age 63. Talk to your health care provider about which screenings and vaccines you need and how often you need them. This information is not intended to replace advice given to you by your health care provider. Make sure you discuss any questions you have with your health care provider. Document Released: 11/13/2015 Document Revised: 07/06/2016 Document Reviewed: 08/18/2015 Elsevier Interactive Patient Education  2017 [place] Prevention in the Home Falls can cause injuries. They can happen to people of all ages. There are many things you can do to make your home safe and to help prevent falls. What can I do on the outside of my home?  Regularly fix the edges of walkways and driveways and fix any cracks.  Remove anything that might make you trip as you walk through a door, such as a raised step or threshold.  Trim any bushes or trees on the path to your home.  Use bright outdoor lighting.  Clear any walking paths of anything that might make someone trip, such as rocks or tools.  Regularly check to see if handrails are loose or broken. Make sure that both sides of any steps have handrails.  Any raised decks and porches should have guardrails on the edges.  Have any leaves, snow, or ice cleared regularly.  Use sand or salt on walking paths during winter.  Clean up any spills in your garage right away. This includes oil or grease spills. What can I do in the bathroom?  Use night lights.  Install grab bars by the toilet and in the tub and shower. Do not use towel bars as grab bars.  Use non-skid mats or decals in the tub or shower.  If you need to sit down in the shower, use a plastic, non-slip stool.  Keep the floor dry. Clean up any water that spills on the floor as soon as it happens.  Remove soap buildup in the tub or shower regularly.  Attach bath mats  securely with double-sided non-slip rug tape.  Do not have throw rugs and other things on the floor that can make you trip. What can I do in the bedroom?  Use night lights.  Make sure that you have a light by your bed that is easy to reach.  Do not use any sheets or blankets that are too big for your bed. They should not hang down onto the floor.  Have a firm chair that has side arms. You can use this for support while you get dressed.  Do not have throw rugs and other things on the floor that can make you trip. What can I do in the kitchen?  Clean up any spills right away.  Avoid walking on wet floors.  Keep items that you use a lot in easy-to-reach places.  If you need to reach something above you, use a strong step stool that has a grab bar.  Keep electrical cords out of the way.  Do not use floor polish or wax that makes floors slippery. If you must use wax, use non-skid floor wax.  Do not have throw rugs and other things on the floor that can  make you trip. What can I do with my stairs?  Do not leave any items on the stairs.  Make sure that there are handrails on both sides of the stairs and use them. Fix handrails that are broken or loose. Make sure that handrails are as long as the stairways.  Check any carpeting to make sure that it is firmly attached to the stairs. Fix any carpet that is loose or worn.  Avoid having throw rugs at the top or bottom of the stairs. If you do have throw rugs, attach them to the floor with carpet tape.  Make sure that you have a light switch at the top of the stairs and the bottom of the stairs. If you do not have them, ask someone to add them for you. What else can I do to help prevent falls?  Wear shoes that:  Do not have high heels.  Have rubber bottoms.  Are comfortable and fit you well.  Are closed at the toe. Do not wear sandals.  If you use a stepladder:  Make sure that it is fully opened. Do not climb a closed  stepladder.  Make sure that both sides of the stepladder are locked into place.  Ask someone to hold it for you, if possible.  Clearly mark and make sure that you can see:  Any grab bars or handrails.  First and last steps.  Where the edge of each step is.  Use tools that help you move around (mobility aids) if they are needed. These include:  Canes.  Walkers.  Scooters.  Crutches.  Turn on the lights when you go into a dark area. Replace any light bulbs as soon as they burn out.  Set up your furniture so you have a clear path. Avoid moving your furniture around.  If any of your floors are uneven, fix them.  If there are any pets around you, be aware of where they are.  Review your medicines with your doctor. Some medicines can make you feel dizzy. This can increase your chance of falling. Ask your doctor what other things that you can do to help prevent falls. This information is not intended to replace advice given to you by your health care provider. Make sure you discuss any questions you have with your health care provider. Document Released: 08/13/2009 Document Revised: 03/24/2016 Document Reviewed: 11/21/2014 Elsevier Interactive Patient Education  2017 [employer].

## 2018-10-22 NOTE — Progress Notes (Signed)
Subjective:   Zachary Martinez is a 77 y.o. male who presents for Medicare Annual/Subsequent preventive examination.  Review of Systems:  N/A  Cardiac Risk Factors include: advanced age (>57men, >70 women);diabetes mellitus;dyslipidemia;hypertension;male gender;obesity (BMI >30kg/m2)     Objective:    Vitals: BP (!) 132/52 (BP Location: Right Arm)   Pulse 66   Temp 98.2 F (36.8 C) (Oral)   Ht 5\' 9"  (1.753 m)   Wt 261 lb (118.4 kg)   BMI 38.54 kg/m   Body mass index is 38.54 kg/m.  Advanced Directives 10/22/2018 10/19/2017 11/01/2016 06/07/2016 07/31/2014  Does Patient Have a Medical Advance Directive? Yes Yes No No No;Yes  Type of Paramedic of [place];Living will [place];Living will - - Living will  Copy of [place] in Chart? Yes - validated most recent copy scanned in chart (See row information) Yes - - -  Would patient like information on creating a medical advance directive? - - No - Patient declined - -    Tobacco Social History   Tobacco Use  Smoking Status Former Smoker  . Last attempt to quit: 07/04/1979  . Years since quitting: 39.3  Smokeless Tobacco Former Systems developer  . Quit date: 11/01/1979     Counseling given: Not Answered   Clinical Intake:  Pre-visit preparation completed: Yes  Pain : No/denies pain Pain Score: 0-No pain    Diabetes:  Is the patient diabetic?  Yes type 2 If diabetic, was a CBG obtained today?  No  Did the patient bring in their glucometer from home?  No  How often do you monitor your CBG's? Once a month.   Financial Strains and Diabetes Management:  Are you having any financial strains with the device, your supplies or your medication? No .  Does the patient want to be seen by Chronic Care Management for management of their diabetes?  No  Would the patient like to be referred to a Nutritionist or for Diabetic Management?  No   Diabetic Exams:  Diabetic Eye Exam:  Completed 03/05/18.   Diabetic Foot Exam: Completed 02/09/17. Pt has been advised about the importance in completing this exam. Note made to have this completed at next OV.   Nutritional Status: BMI > 30  Obese Nutritional Risks: None   How often do you need to have someone help you when you read instructions, pamphlets, or other written materials from your doctor or pharmacy?: 1 - Never  Interpreter Needed?: No  Information entered by :: [facility], LPN  Past Medical History:  Diagnosis Date  . CAD (coronary artery disease)   . Diabetes ([place])   . Hyperlipidemia   . Hypertension   . Hypothyroidism   . Sleep apnea    cpap   Past Surgical History:  Procedure Laterality Date  . CATARACT EXTRACTION W/PHACO Right 11/01/2016   Procedure: CATARACT EXTRACTION PHACO AND INTRAOCULAR LENS PLACEMENT (IOC);  Surgeon: Birder Robson, MD;  Location: [facility] ORS;  Service: Ophthalmology;  Laterality: Right;  Lot #[id number] H [place]: oo:35.4 AP%; 22.4 CDE: 7.91  . CYST REMOVAL NECK    . KNEE ARTHROSCOPY Right 2003, 2005   x2  . KNEE ARTHROSCOPY Left 2013  . nasal polyps  1978  . TIBIA FRACTURE SURGERY Left 2012  . TONSILLECTOMY AND ADENOIDECTOMY    . uvuloplasty  2000   Family History  Problem Relation Age of Onset  . Colon cancer Brother 2  . Colon cancer Maternal Aunt 28  .  Colon cancer Maternal Aunt 71  . Kidney disease Mother   . Anemia Mother   . Diabetes Brother   . Hyperlipidemia Brother   . Hypertension Brother    Social History   Socioeconomic History  . Marital status: Married    Spouse name: Not on file  . Number of children: 3  . Years of education: Not on file  . Highest education level: Associate degree: occupational, Hotel manager, or vocational program  Occupational History  . Occupation: retired  Scientific laboratory technician  . Financial resource strain: Not hard at all  . Food insecurity:    Worry: Never true    Inability: Never true  . Transportation needs:    Medical: No     Non-medical: No  Tobacco Use  . Smoking status: Former Smoker    Last attempt to quit: 07/04/1979    Years since quitting: 39.3  . Smokeless tobacco: Former Systems developer    Quit date: 11/01/1979  Substance and Sexual Activity  . Alcohol use: Not Currently  . Drug use: No  . Sexual activity: Not on file  Lifestyle  . Physical activity:    Days per week: 0 days    Minutes per session: 0 min  . Stress: Not at all  Relationships  . Social connections:    Talks on phone: Patient refused    Gets together: Patient refused    Attends religious service: Patient refused    Active member of club or organization: Patient refused    Attends meetings of clubs or organizations: Patient refused    Relationship status: Patient refused  Other Topics Concern  . Not on file  Social History Narrative  . Not on file    Outpatient Encounter Medications as of 10/22/2018  Medication Sig  . amLODipine (NORVASC) 10 MG tablet TAKE 1 TABLET EVERY DAY  . atorvastatin (LIPITOR) 40 MG tablet Take 1 tablet (40 mg total) by mouth daily.  . carvedilol (COREG) 6.25 MG tablet TAKE 1 TABLET TWICE DAILY  . Cholecalciferol (VITAMIN D3) 1000 UNITS CAPS Take 2,000 Units by mouth daily.   Marland Kitchen EPINEPHRINE, ANAPHYLAXIS THERAPY AGENTS, EPIPEN 2-PAK, 0.3MG /0.3ML (Injection Device) - Historical Medication  as directed (0.3 MG/0.3ML) Active  . hydrALAZINE (APRESOLINE) 100 MG tablet Take 1 tablet by mouth 2 (two) times daily.   Marland Kitchen levothyroxine (SYNTHROID, LEVOTHROID) 88 MCG tablet TAKE 1 TABLET EVERY DAY  . metFORMIN (GLUCOPHAGE) 1000 MG tablet Take 1 tablet (1,000 mg total) by mouth 2 (two) times daily with a meal.  . pioglitazone (ACTOS) 30 MG tablet TAKE 1 TABLET EVERY DAY  . furosemide (LASIX) 20 MG tablet Take 1 tablet by mouth every morning.   No facility-administered encounter medications on file as of 10/22/2018.     Activities of Daily Living In your present state of health, do you have any difficulty performing the  following activities: 10/22/2018  Hearing? Y  Comment Does not wear hearing aids.   Vision? Y  Comment Due to eye issue in right eye (unsure name). Wears eye glasses.   Difficulty concentrating or making decisions? N  Walking or climbing stairs? N  Dressing or bathing? N  Doing errands, shopping? N  Preparing Food and eating ? N  Using the Toilet? N  In the past six months, have you accidently leaked urine? Y  Comment Due to urgency. Does not wear protection.   Do you have problems with loss of bowel control? Y  Comment Occasionally, having diarrhea intermittenly.   Managing your Medications?  N  Managing your Finances? N  Housekeeping or managing your Housekeeping? N  Some recent data might be hidden    Patient Care Team: Jerrol Banana., MD as PCP - General (Family Medicine) Birder Robson, MD as Referring Physician (Ophthalmology) Corey Skains, MD as Consulting Physician (Cardiology) Hessie Knows, MD as Consulting Physician (Orthopedic Surgery) Poggi, Marshall Cork, MD as Consulting Physician (Surgery) Ladene Artist, MD as Consulting Physician (Gastroenterology)   Assessment:   This is a routine wellness examination for Zachary Martinez.  Exercise Activities and Dietary recommendations Current Exercise Habits: The patient does not participate in regular exercise at present, Exercise limited by: orthopedic condition(s)  Goals    . DIET - REDUCE PORTION SIZE     Recommend cutting portion sizes in half and eating 3 small meals a day with 2 healthy snacks in between.        Fall Risk Fall Risk  10/22/2018 10/19/2017 10/18/2016 10/08/2015 06/08/2015  Falls in the past year? 0 Yes No No No  Number falls in past yr: 0 1 - - -  Comment - tripped over a tree - - -  Injury with Fall? 0 No - - -  Follow up - Falls prevention discussed - - -   FALL RISK PREVENTION PERTAINING TO THE HOME:  Any stairs in or around the home WITH handrails? Yes  Home free of loose throw rugs in  walkways, pet beds, electrical cords, etc? Yes  Adequate lighting in your home to reduce risk of falls? Yes   ASSISTIVE DEVICES UTILIZED TO PREVENT FALLS:  Life alert? No  Use of a cane, walker or w/c? No  Grab bars in the bathroom? No Shower chair or bench in shower? No  Elevated toilet seat or a handicapped toilet? Yes    TIMED UP AND GO:  Was the test performed? No .    Depression Screen PHQ 2/9 Scores 10/22/2018 10/19/2017 10/18/2016 10/08/2015  PHQ - 2 Score 0 0 0 0    Cognitive Function:      6CIT Screen 10/22/2018  What Year? 0 points  What month? 0 points  What time? 0 points  Count back from 20 0 points  Months in reverse 0 points  Repeat phrase 0 points  Total Score 0    Immunization History  Administered Date(s) Administered  . Influenza Split 08/21/2012  . Influenza, High Dose Seasonal PF 07/22/2014, 08/17/2017  . Influenza-Unspecified 09/07/2015, 08/15/2018  . Pneumococcal Conjugate-13 06/16/2014  . Pneumococcal Polysaccharide-23 06/18/2012  . Td 11/21/2003, 01/24/2015  . Zoster 06/18/2012    Qualifies for Shingles Vaccine? Yes  Zostavax completed 06/08/12. Due for Shingrix. Education has been provided regarding the importance of this vaccine. Pt has been advised to call insurance company to determine out of pocket expense. Advised may also receive vaccine at local pharmacy or Health Dept. Verbalized acceptance and understanding.  Tdap: Up to date  Flu Vaccine: Up to date  Pneumococcal Vaccine: Up to date   Screening Tests Health Maintenance  Topic Date Due  . FOOT EXAM  02/09/2018  . HEMOGLOBIN A1C  02/26/2019  . OPHTHALMOLOGY EXAM  03/06/2019  . COLONOSCOPY  08/15/2019  . TETANUS/TDAP  01/23/2025  . INFLUENZA VACCINE  Completed  . PNA vac Low Risk Adult  Completed   Cancer Screenings:  Colorectal Screening: Completed 08/14/14. Repeat every 5 years.  Lung Cancer Screening: (Low Dose CT Chest recommended if Age 27-80 years, 30  pack-year currently smoking OR have quit w/in 15years.) does  not qualify.    Additional Screening:  Vision Screening: Recommended annual ophthalmology exams for early detection of glaucoma and other disorders of the eye.  Dental Screening: Recommended annual dental exams for proper oral hygiene  Community Resource Referral:  CRR required this visit?  No        Plan:  I have personally reviewed and addressed the Medicare Annual Wellness questionnaire and have noted the following in the patient's chart:  A. Medical and social history B. Use of alcohol, tobacco or illicit drugs  C. Current medications and supplements D. Functional ability and status E.  Nutritional status F.  Physical activity G. Advance directives H. List of other physicians I.  Hospitalizations, surgeries, and ER visits in previous 12 months J.  [place] such as hearing and vision if needed, cognitive and depression L. Referrals and appointments - none  In addition, I have reviewed and discussed with patient certain preventive protocols, quality metrics, and best practice recommendations. A written personalized care plan for preventive services as well as general preventive health recommendations were provided to patient.  See attached scanned questionnaire for additional information.   Signed,  Fabio Neighbors, LPN Nurse Health Advisor   Nurse Recommendations: Pt needs a diabetic foot exam at next OV.

## 2018-10-30 DIAGNOSIS — I10 Essential (primary) hypertension: Secondary | ICD-10-CM | POA: Diagnosis not present

## 2018-10-30 DIAGNOSIS — R001 Bradycardia, unspecified: Secondary | ICD-10-CM | POA: Diagnosis not present

## 2018-10-30 DIAGNOSIS — G4733 Obstructive sleep apnea (adult) (pediatric): Secondary | ICD-10-CM | POA: Diagnosis not present

## 2018-10-30 DIAGNOSIS — I2510 Atherosclerotic heart disease of native coronary artery without angina pectoris: Secondary | ICD-10-CM | POA: Diagnosis not present

## 2018-10-30 DIAGNOSIS — E119 Type 2 diabetes mellitus without complications: Secondary | ICD-10-CM | POA: Diagnosis not present

## 2018-11-09 NOTE — Telephone Encounter (Signed)
Completed AWV 10/22/18. -MM

## 2018-11-26 ENCOUNTER — Other Ambulatory Visit: Payer: Self-pay | Admitting: Family Medicine

## 2018-11-26 DIAGNOSIS — E7849 Other hyperlipidemia: Secondary | ICD-10-CM

## 2018-12-17 ENCOUNTER — Other Ambulatory Visit: Payer: Self-pay | Admitting: Family Medicine

## 2018-12-17 DIAGNOSIS — I10 Essential (primary) hypertension: Secondary | ICD-10-CM

## 2018-12-27 ENCOUNTER — Encounter: Payer: Self-pay | Admitting: Family Medicine

## 2018-12-27 ENCOUNTER — Ambulatory Visit (INDEPENDENT_AMBULATORY_CARE_PROVIDER_SITE_OTHER): Payer: Medicare Other | Admitting: Family Medicine

## 2018-12-27 VITALS — BP 132/70 | HR 54 | Temp 97.9°F | Resp 16 | Ht 69.0 in | Wt 260.0 lb

## 2018-12-27 DIAGNOSIS — I2510 Atherosclerotic heart disease of native coronary artery without angina pectoris: Secondary | ICD-10-CM

## 2018-12-27 DIAGNOSIS — Z6838 Body mass index (BMI) 38.0-38.9, adult: Secondary | ICD-10-CM | POA: Diagnosis not present

## 2018-12-27 DIAGNOSIS — I872 Venous insufficiency (chronic) (peripheral): Secondary | ICD-10-CM | POA: Diagnosis not present

## 2018-12-27 DIAGNOSIS — E1142 Type 2 diabetes mellitus with diabetic polyneuropathy: Secondary | ICD-10-CM

## 2018-12-27 DIAGNOSIS — E1165 Type 2 diabetes mellitus with hyperglycemia: Secondary | ICD-10-CM | POA: Diagnosis not present

## 2018-12-27 DIAGNOSIS — I10 Essential (primary) hypertension: Secondary | ICD-10-CM | POA: Diagnosis not present

## 2018-12-27 DIAGNOSIS — E7849 Other hyperlipidemia: Secondary | ICD-10-CM

## 2018-12-27 LAB — POCT GLYCOSYLATED HEMOGLOBIN (HGB A1C): Hemoglobin A1C: 8.3 % — AB (ref 4.0–5.6)

## 2018-12-27 NOTE — Patient Instructions (Signed)
Discontinue Metformin.

## 2018-12-27 NOTE — Progress Notes (Signed)
Patient: Zachary Martinez Male    DOB: May 10, 1941   78 y.o.   MRN: [id number] Visit Date: 12/27/2018  Today's Provider: [name], MD   Chief Complaint  Patient presents with  . Diabetes   Subjective:     HPI  Diabetes Mellitus Type II, Follow-up:   Lab Results  Component Value Date   HGBA1C 8.3 (A) 12/27/2018   HGBA1C 7.8 (A) 08/27/2018   HGBA1C 7.1 (H) 05/25/2018    Last seen for diabetes 4 months ago.  Management since then includes no changes. He reports good compliance with treatment. He is not having side effects.  Current symptoms include none and have been stable. Home blood sugar records: not checked at home.   Episodes of hypoglycemia? no   Current Insulin Regimen: none Most Recent Eye Exam: 03/05/2018 Weight trend: stable Prior visit with dietician: No Current exercise: no regular exercise Current diet habits: well balanced   Pertinent Labs:    Component Value Date/Time   CHOL 124 05/25/2018 0803   TRIG 249 (H) 05/25/2018 0803   HDL 29 (L) 05/25/2018 0803   LDLCALC 45 05/25/2018 0803   CREATININE 0.90 05/25/2018 0803   CREATININE 1.26 12/06/2012 0426    Wt Readings from Last 3 Encounters:  12/27/18 260 lb (117.9 kg)  10/22/18 261 lb (118.4 kg)  08/27/18 261 lb (118.4 kg)    Allergies  Allergen Reactions  . Ace Inhibitors Anaphylaxis and Other (See Comments)     Current Outpatient Medications:  .  amLODipine (NORVASC) 10 MG tablet, TAKE 1 TABLET EVERY DAY, Disp: 90 tablet, Rfl: 3 .  atorvastatin (LIPITOR) 40 MG tablet, TAKE 1 TABLET EVERY DAY, Disp: 90 tablet, Rfl: 3 .  carvedilol (COREG) 6.25 MG tablet, TAKE 1 TABLET TWICE DAILY, Disp: 180 tablet, Rfl: 0 .  Cholecalciferol (VITAMIN D3) 1000 UNITS CAPS, Take 2,000 Units by mouth daily. , Disp: , Rfl:  .  EPINEPHRINE, ANAPHYLAXIS THERAPY AGENTS,, EPIPEN 2-PAK, 0.3MG/0.3ML (Injection Device) - Historical Medication  as directed (0.3 MG/0.3ML) Active, Disp: , Rfl:  .  furosemide  (LASIX) 20 MG tablet, Take 1 tablet by mouth every morning., Disp: , Rfl:  .  hydrALAZINE (APRESOLINE) 100 MG tablet, Take 1 tablet by mouth 2 (two) times daily. , Disp: , Rfl:  .  levothyroxine (SYNTHROID, LEVOTHROID) 88 MCG tablet, TAKE 1 TABLET EVERY DAY, Disp: 90 tablet, Rfl: 3 .  metFORMIN (GLUCOPHAGE) 1000 MG tablet, Take 1 tablet (1,000 mg total) by mouth 2 (two) times daily with a meal., Disp: 180 tablet, Rfl: 3 .  pioglitazone (ACTOS) 30 MG tablet, TAKE 1 TABLET EVERY DAY, Disp: 90 tablet, Rfl: 3  Review of Systems  Constitutional: Negative for activity change, appetite change, chills, diaphoresis, fatigue, fever and unexpected weight change.  HENT: Negative.   Eyes: Negative.   Respiratory: Negative for cough and shortness of breath.   Cardiovascular: Negative for chest pain, palpitations and leg swelling.  Gastrointestinal: Negative.   Endocrine: Negative for cold intolerance, heat intolerance, polydipsia, polyphagia and polyuria.  Musculoskeletal: Positive for arthralgias.  Allergic/Immunologic: Negative.   Neurological: Negative for dizziness, light-headedness and headaches.  Psychiatric/Behavioral: Negative.     Social History   Tobacco Use  . Smoking status: Former Smoker    Last attempt to quit: 07/04/1979    Years since quitting: 39.5  . Smokeless tobacco: Former Systems developer    Quit date: 11/01/1979  Substance Use Topics  . Alcohol use: Not Currently  Objective:   BP 132/70 (BP Location: Left Arm, Patient Position: Sitting, Cuff Size: Large)   Pulse (!) 54   Temp 97.9 F (36.6 C)   Resp 16   Ht _0  (1.753 m)   Wt 260 lb (117.9 kg)   SpO2 96%   BMI 38.40 kg/m  Vitals:   12/27/18 0823  BP: 132/70  Pulse: (!) 54  Resp: 16  Temp: 97.9 F (36.6 C)  SpO2: 96%  Weight: 260 lb (117.9 kg)  Height: _1  (1.753 m)     Physical Exam Vitals signs reviewed.  Constitutional:      Appearance: He is well-developed.  HENT:     Head: Normocephalic and  atraumatic.     Right Ear: External ear normal.     Left Ear: External ear normal.     Nose: Nose normal.  Eyes:     General: No scleral icterus.    Conjunctiva/sclera: Conjunctivae normal.  Neck:     Thyroid: No thyromegaly.  Cardiovascular:     Rate and Rhythm: Normal rate and regular rhythm.     Heart sounds: Normal heart sounds.  Pulmonary:     Effort: Pulmonary effort is normal.     Breath sounds: Normal breath sounds.  Abdominal:     Palpations: Abdomen is soft.  Skin:    General: Skin is warm and dry.  Neurological:     General: No focal deficit present.     Mental Status: He is alert and oriented to person, place, and time.  Psychiatric:        Mood and Affect: Mood normal.        Behavior: Behavior normal.        Thought Content: Thought content normal.        Judgment: Judgment normal.         Assessment & Plan    1. Type 2 diabetes mellitus with hyperglycemia, without long-term current use of insulin (HCC) A1c went from 7.8 to 8.3 off of invoke a met.  At this time after discussion with patient about expense and efficacy restart invoke a met twice daily.Stop metformin. - POCT glycosylated hemoglobin (Hb A1C) - Canagliflozin-metFORMIN HCl (INVOKAMET) [phone] MG TABS; Take 1 tablet by mouth 2 (two) times daily.  Dispense: 60 tablet; Refill: 5  2. Arteriosclerosis of coronary artery All risk factors treated  3. Essential hypertension Controlled  4. Venous insufficiency of both lower extremities   5. Diabetic polyneuropathy associated with type 2 diabetes mellitus ([place])   6. Other hyperlipidemia   7. Class 2 severe obesity due to excess calories with serious comorbidity and body mass index (BMI) of 38.0 to 38.9 in adult [facility]) With diabetes and hypertension.    I have done the exam and reviewed the above chart and it is accurate to the best of my knowledge. Development worker, community has been used in this note in any air is in the dictation or transcription  are unintentional.  [name], MD  [place]

## 2018-12-30 MED ORDER — CANAGLIFLOZIN-METFORMIN HCL 150-1000 MG PO TABS: 1.0000 | ORAL_TABLET | Freq: Two times a day (BID) | ORAL | 5 refills | Status: DC

## 2019-01-16 ENCOUNTER — Other Ambulatory Visit: Payer: Self-pay | Admitting: Family Medicine

## 2019-02-18 DIAGNOSIS — B0052 Herpesviral keratitis: Secondary | ICD-10-CM | POA: Diagnosis not present

## 2019-02-20 DIAGNOSIS — B0052 Herpesviral keratitis: Secondary | ICD-10-CM | POA: Diagnosis not present

## 2019-02-21 ENCOUNTER — Other Ambulatory Visit: Payer: Self-pay | Admitting: Family Medicine

## 2019-02-21 DIAGNOSIS — I10 Essential (primary) hypertension: Secondary | ICD-10-CM

## 2019-02-22 DIAGNOSIS — B0052 Herpesviral keratitis: Secondary | ICD-10-CM | POA: Diagnosis not present

## 2019-02-27 DIAGNOSIS — B0052 Herpesviral keratitis: Secondary | ICD-10-CM | POA: Diagnosis not present

## 2019-03-06 DIAGNOSIS — B0052 Herpesviral keratitis: Secondary | ICD-10-CM | POA: Diagnosis not present

## 2019-03-13 DIAGNOSIS — E1142 Type 2 diabetes mellitus with diabetic polyneuropathy: Secondary | ICD-10-CM | POA: Diagnosis not present

## 2019-03-13 DIAGNOSIS — M12562 Traumatic arthropathy, left knee: Secondary | ICD-10-CM | POA: Diagnosis not present

## 2019-03-13 DIAGNOSIS — M1711 Unilateral primary osteoarthritis, right knee: Secondary | ICD-10-CM | POA: Diagnosis not present

## 2019-03-13 DIAGNOSIS — B0052 Herpesviral keratitis: Secondary | ICD-10-CM | POA: Diagnosis not present

## 2019-03-27 DIAGNOSIS — M12562 Traumatic arthropathy, left knee: Secondary | ICD-10-CM | POA: Diagnosis not present

## 2019-04-22 ENCOUNTER — Ambulatory Visit (INDEPENDENT_AMBULATORY_CARE_PROVIDER_SITE_OTHER): Payer: Medicare Other | Admitting: Family Medicine

## 2019-04-22 ENCOUNTER — Other Ambulatory Visit: Payer: Self-pay

## 2019-04-22 ENCOUNTER — Encounter: Payer: Self-pay | Admitting: Family Medicine

## 2019-04-22 VITALS — BP 134/68 | HR 50 | Temp 98.7°F | Resp 16 | Ht 69.0 in | Wt 243.0 lb

## 2019-04-22 DIAGNOSIS — I2510 Atherosclerotic heart disease of native coronary artery without angina pectoris: Secondary | ICD-10-CM

## 2019-04-22 DIAGNOSIS — E7849 Other hyperlipidemia: Secondary | ICD-10-CM

## 2019-04-22 DIAGNOSIS — Z6838 Body mass index (BMI) 38.0-38.9, adult: Secondary | ICD-10-CM

## 2019-04-22 DIAGNOSIS — G4733 Obstructive sleep apnea (adult) (pediatric): Secondary | ICD-10-CM

## 2019-04-22 DIAGNOSIS — E1165 Type 2 diabetes mellitus with hyperglycemia: Secondary | ICD-10-CM | POA: Diagnosis not present

## 2019-04-22 DIAGNOSIS — I10 Essential (primary) hypertension: Secondary | ICD-10-CM | POA: Diagnosis not present

## 2019-04-22 LAB — POCT UA - MICROALBUMIN: Microalbumin Ur, POC: 20 mg/L

## 2019-04-22 LAB — POCT GLYCOSYLATED HEMOGLOBIN (HGB A1C)
Est. average glucose Bld gHb Est-mCnc: 157
Hemoglobin A1C: 7.1 % — AB (ref 4.0–5.6)

## 2019-04-22 NOTE — Progress Notes (Signed)
Patient: Zachary Martinez Male    DOB: 1940-12-29   78 y.o.   MRN: [id number] Visit Date: 04/22/2019  Today's Provider: [name], MD   Chief Complaint  Patient presents with  . Diabetes  . Hypertension  . Hyperlipidemia  . Sleep Apnea   Subjective:   HPI    Diabetes Mellitus Type II, Follow-up:  Patient has been working hard on lifestyle choices with dietary change and has lost 17 pounds since his last visit. Lab Results  Component Value Date   HGBA1C 7.1 (A) 04/22/2019   HGBA1C 8.3 (A) 12/27/2018   HGBA1C 7.8 (A) 08/27/2018    Last seen for diabetes 4 months ago.  Management since then includes no changes. He reports good compliance with treatment. He is not having side effects.  Current symptoms include none and have been stable. Home blood sugar records: 140s  Episodes of hypoglycemia? no   Current Insulin Regimen: none Most Recent Eye Exam: up to date Weight trend: stable Prior visit with dietician: No Current exercise: walking Current diet habits: well balanced  Pertinent Labs:    Component Value Date/Time   CHOL 124 05/25/2018 0803   TRIG 249 (H) 05/25/2018 0803   HDL 29 (L) 05/25/2018 0803   LDLCALC 45 05/25/2018 0803   CREATININE 0.90 05/25/2018 0803   CREATININE 1.26 12/06/2012 0426    Wt Readings from Last 3 Encounters:  04/22/19 243 lb (110.2 kg)  12/27/18 260 lb (117.9 kg)  10/22/18 261 lb (118.4 kg)    Hypertension, follow-up:  BP Readings from Last 3 Encounters:  04/22/19 134/68  12/27/18 132/70  10/22/18 (!) 132/52    He was last seen for hypertension 4 months ago.  BP at that visit was 132/70. Management since that visit includes no changes. He reports good compliance with treatment. He is not having side effects.  He is exercising. He is adherent to low salt diet.   Outside blood pressures are checked occasionally. He is experiencing none.  Patient denies exertional chest pressure/discomfort, lower extremity  edema and palpitations.    Sleep [place], follow up: Patient also reports that he is currently wearing a CPAP nightly and it helps with his symptoms. He is tolerating it well.    He is using his CPAP all night every night Allergies  Allergen Reactions  . Ace Inhibitors Anaphylaxis and Other (See Comments)     Current Outpatient Medications:  .  amLODipine (NORVASC) 10 MG tablet, TAKE 1 TABLET EVERY DAY, Disp: 90 tablet, Rfl: 3 .  atorvastatin (LIPITOR) 40 MG tablet, TAKE 1 TABLET EVERY DAY, Disp: 90 tablet, Rfl: 3 .  Canagliflozin-metFORMIN HCl (INVOKAMET) [phone] MG TABS, Take 1 tablet by mouth 2 (two) times daily., Disp: 60 tablet, Rfl: 5 .  carvedilol (COREG) 6.25 MG tablet, TAKE 1 TABLET TWICE DAILY, Disp: 180 tablet, Rfl: 0 .  Cholecalciferol (VITAMIN D3) 1000 UNITS CAPS, Take 2,000 Units by mouth daily. , Disp: , Rfl:  .  EPINEPHRINE, ANAPHYLAXIS THERAPY AGENTS,, EPIPEN 2-PAK, 0.3MG /0.3ML (Injection Device) - Historical Medication  as directed (0.3 MG/0.3ML) Active, Disp: , Rfl:  .  furosemide (LASIX) 20 MG tablet, Take 1 tablet by mouth every morning., Disp: , Rfl:  .  hydrALAZINE (APRESOLINE) 100 MG tablet, Take 1 tablet by mouth 2 (two) times daily. , Disp: , Rfl:  .  levothyroxine (SYNTHROID, LEVOTHROID) 88 MCG tablet, TAKE 1 TABLET EVERY DAY, Disp: 90 tablet, Rfl: 3 .  pioglitazone (ACTOS) 30 MG  tablet, TAKE 1 TABLET EVERY DAY, Disp: 90 tablet, Rfl: 3  Review of Systems  Constitutional: Negative for activity change, appetite change, chills, diaphoresis, fatigue, fever and unexpected weight change.  HENT: Negative.   Eyes: Negative.   Respiratory: Negative for cough and shortness of breath.   Gastrointestinal: Negative for abdominal distention.  Endocrine: Negative.   Musculoskeletal: Negative for arthralgias.  Allergic/Immunologic: Negative for environmental allergies.  Neurological: Negative for dizziness.  Psychiatric/Behavioral: Negative for agitation, self-injury, sleep  disturbance and suicidal ideas.    Social History   Tobacco Use  . Smoking status: Former Smoker    Quit date: 07/04/1979    Years since quitting: 39.8  . Smokeless tobacco: Former Systems developer    Quit date: 11/01/1979  Substance Use Topics  . Alcohol use: Not Currently      Objective:   BP 134/68 (BP Location: Left Arm, Patient Position: Sitting, Cuff Size: Large)   Pulse (!) 50   Temp 98.7 F (37.1 C)   Resp 16   Ht 5\' 9"  (1.753 m)   Wt 243 lb (110.2 kg)   BMI 35.88 kg/m  Vitals:   04/22/19 0834  BP: 134/68  Pulse: (!) 50  Resp: 16  Temp: 98.7 F (37.1 C)  Weight: 243 lb (110.2 kg)  Height: 5\' 9"  (1.753 m)     Physical Exam Vitals signs reviewed.  Constitutional:      Appearance: He is well-developed. He is obese.  HENT:     Head: Normocephalic and atraumatic.     Right Ear: External ear normal.     Left Ear: External ear normal.     Nose: Nose normal.  Eyes:     General: No scleral icterus.    Conjunctiva/sclera: Conjunctivae normal.  Neck:     Thyroid: No thyromegaly.  Cardiovascular:     Rate and Rhythm: Normal rate and regular rhythm.     Heart sounds: Normal heart sounds.  Pulmonary:     Effort: Pulmonary effort is normal.     Breath sounds: Normal breath sounds.  Abdominal:     Palpations: Abdomen is soft.  Skin:    General: Skin is warm and dry.  Neurological:     General: No focal deficit present.     Mental Status: He is alert and oriented to person, place, and time.  Psychiatric:        Mood and Affect: Mood normal.        Behavior: Behavior normal.        Thought Content: Thought content normal.        Judgment: Judgment normal.         Assessment & Plan    1. Type 2 diabetes mellitus with hyperglycemia, without long-term current use of insulin (HCC) Much improved control with 17 pound weight loss.RTC 4 months. - POCT glycosylated hemoglobin (Hb A1C)--7.1 today - POCT UA - Microalbumin  2. Essential hypertension Controlled on  amlodipine and hydralazine. - Comprehensive metabolic panel  3. Arteriosclerosis of coronary artery All risk factors treated. - CBC with Differential/Platelet  4. Other hyperlipidemia On high-dose Lipitor. - Lipid panel - TSH  5. Obstructive sleep apnea syndrome Using CPAP machine nightly.  Continue to work on lifestyle and weight loss. 6.Obesity With DM/HTN/HLD    [name], MD  [facility]

## 2019-04-23 LAB — COMPREHENSIVE METABOLIC PANEL
ALT: 13 IU/L (ref 0–44)
AST: 15 IU/L (ref 0–40)
Albumin/Globulin Ratio: 1.9 (ref 1.2–2.2)
Albumin: 3.9 g/dL (ref 3.7–4.7)
Alkaline Phosphatase: 88 IU/L (ref 39–117)
BUN/Creatinine Ratio: 18 (ref 10–24)
BUN: 16 mg/dL (ref 8–27)
Bilirubin Total: 0.6 mg/dL (ref 0.0–1.2)
CO2: 23 mmol/L (ref 20–29)
Calcium: 10.9 mg/dL — ABNORMAL HIGH (ref 8.6–10.2)
Chloride: 107 mmol/L — ABNORMAL HIGH (ref 96–106)
Creatinine, Ser: 0.89 mg/dL (ref 0.76–1.27)
GFR calc Af Amer: 96 mL/min/{1.73_m2} (ref >59)
GFR calc non Af Amer: 83 mL/min/{1.73_m2} (ref >59)
Globulin, Total: 2.1 g/dL (ref 1.5–4.5)
Glucose: 155 mg/dL — ABNORMAL HIGH (ref 65–99)
Potassium: 4.5 mmol/L (ref 3.5–5.2)
Sodium: 142 mmol/L (ref 134–144)
Total Protein: 6 g/dL (ref 6.0–8.5)

## 2019-04-23 LAB — CBC WITH DIFFERENTIAL/PLATELET
Basophils Absolute: 0 10*3/uL (ref 0.0–0.2)
Basos: 1 %
EOS (ABSOLUTE): 0.1 10*3/uL (ref 0.0–0.4)
Eos: 2 %
Hematocrit: 42.2 % (ref 37.5–51.0)
Hemoglobin: 14.1 g/dL (ref 13.0–17.7)
Immature Grans (Abs): 0 10*3/uL (ref 0.0–0.1)
Immature Granulocytes: 0 %
Lymphocytes Absolute: 1.1 10*3/uL (ref 0.7–3.1)
Lymphs: 22 %
MCH: 30.7 pg (ref 26.6–33.0)
MCHC: 33.4 g/dL (ref 31.5–35.7)
MCV: 92 fL (ref 79–97)
Monocytes Absolute: 0.4 10*3/uL (ref 0.1–0.9)
Monocytes: 9 %
Neutrophils Absolute: 3.3 10*3/uL (ref 1.4–7.0)
Neutrophils: 66 %
Platelets: 185 10*3/uL (ref 150–450)
RBC: 4.6 x10E6/uL (ref 4.14–5.80)
RDW: 14.5 % (ref 11.6–15.4)
WBC: 4.9 10*3/uL (ref 3.4–10.8)

## 2019-04-23 LAB — LIPID PANEL
Chol/HDL Ratio: 3.5 ratio (ref 0.0–5.0)
Cholesterol, Total: 113 mg/dL (ref 100–199)
HDL: 32 mg/dL — ABNORMAL LOW (ref >39)
LDL Calculated: 45 mg/dL (ref 0–99)
Triglycerides: 181 mg/dL — ABNORMAL HIGH (ref 0–149)
VLDL Cholesterol Cal: 36 mg/dL (ref 5–40)

## 2019-04-23 LAB — TSH: TSH: 2.59 u[IU]/mL (ref 0.450–4.500)

## 2019-04-25 ENCOUNTER — Telehealth: Payer: Self-pay

## 2019-04-25 NOTE — Telephone Encounter (Signed)
Left message to call back  

## 2019-04-25 NOTE — Telephone Encounter (Signed)
-----   Message from Jerrol Banana., MD sent at 04/25/2019  7:45 AM EDT ----- Labs stable--follow up Calcium and PTH next visit.

## 2019-04-26 DIAGNOSIS — I2510 Atherosclerotic heart disease of native coronary artery without angina pectoris: Secondary | ICD-10-CM | POA: Diagnosis not present

## 2019-04-26 DIAGNOSIS — I10 Essential (primary) hypertension: Secondary | ICD-10-CM | POA: Diagnosis not present

## 2019-04-26 DIAGNOSIS — I340 Nonrheumatic mitral (valve) insufficiency: Secondary | ICD-10-CM | POA: Diagnosis not present

## 2019-04-26 DIAGNOSIS — R001 Bradycardia, unspecified: Secondary | ICD-10-CM | POA: Diagnosis not present

## 2019-04-26 DIAGNOSIS — E782 Mixed hyperlipidemia: Secondary | ICD-10-CM | POA: Diagnosis not present

## 2019-04-26 DIAGNOSIS — G4733 Obstructive sleep apnea (adult) (pediatric): Secondary | ICD-10-CM | POA: Diagnosis not present

## 2019-04-26 DIAGNOSIS — I119 Hypertensive heart disease without heart failure: Secondary | ICD-10-CM | POA: Diagnosis not present

## 2019-05-02 ENCOUNTER — Other Ambulatory Visit: Payer: Self-pay | Admitting: Family Medicine

## 2019-05-02 DIAGNOSIS — E1165 Type 2 diabetes mellitus with hyperglycemia: Secondary | ICD-10-CM

## 2019-05-20 DIAGNOSIS — X32XXXA Exposure to sunlight, initial encounter: Secondary | ICD-10-CM | POA: Diagnosis not present

## 2019-05-20 DIAGNOSIS — Z08 Encounter for follow-up examination after completed treatment for malignant neoplasm: Secondary | ICD-10-CM | POA: Diagnosis not present

## 2019-05-20 DIAGNOSIS — D2262 Melanocytic nevi of left upper limb, including shoulder: Secondary | ICD-10-CM | POA: Diagnosis not present

## 2019-05-20 DIAGNOSIS — L821 Other seborrheic keratosis: Secondary | ICD-10-CM | POA: Diagnosis not present

## 2019-05-20 DIAGNOSIS — D2272 Melanocytic nevi of left lower limb, including hip: Secondary | ICD-10-CM | POA: Diagnosis not present

## 2019-05-20 DIAGNOSIS — Z85828 Personal history of other malignant neoplasm of skin: Secondary | ICD-10-CM | POA: Diagnosis not present

## 2019-05-20 DIAGNOSIS — D2261 Melanocytic nevi of right upper limb, including shoulder: Secondary | ICD-10-CM | POA: Diagnosis not present

## 2019-05-20 DIAGNOSIS — D225 Melanocytic nevi of trunk: Secondary | ICD-10-CM | POA: Diagnosis not present

## 2019-05-20 DIAGNOSIS — D2271 Melanocytic nevi of right lower limb, including hip: Secondary | ICD-10-CM | POA: Diagnosis not present

## 2019-05-20 DIAGNOSIS — L570 Actinic keratosis: Secondary | ICD-10-CM | POA: Diagnosis not present

## 2019-07-10 ENCOUNTER — Other Ambulatory Visit: Payer: Self-pay | Admitting: Family Medicine

## 2019-07-10 DIAGNOSIS — E1142 Type 2 diabetes mellitus with diabetic polyneuropathy: Secondary | ICD-10-CM

## 2019-07-17 ENCOUNTER — Encounter: Payer: Self-pay | Admitting: Gastroenterology

## 2019-07-17 DIAGNOSIS — Z23 Encounter for immunization: Secondary | ICD-10-CM | POA: Diagnosis not present

## 2019-07-25 ENCOUNTER — Encounter: Payer: Self-pay | Admitting: Gastroenterology

## 2019-08-05 ENCOUNTER — Other Ambulatory Visit: Payer: Self-pay | Admitting: Family Medicine

## 2019-08-05 DIAGNOSIS — I10 Essential (primary) hypertension: Secondary | ICD-10-CM

## 2019-08-05 MED ORDER — CARVEDILOL 6.25 MG PO TABS: 6.2500 mg | ORAL_TABLET | Freq: Two times a day (BID) | ORAL | 1 refills | Status: DC

## 2019-08-05 NOTE — Telephone Encounter (Signed)
[place] faxed refill request for the following medications:  carvedilol (COREG) 6.25 MG tablet   Please advise.

## 2019-08-15 ENCOUNTER — Ambulatory Visit (AMBULATORY_SURGERY_CENTER): Payer: Medicare Other | Admitting: *Deleted

## 2019-08-15 ENCOUNTER — Other Ambulatory Visit: Payer: Self-pay

## 2019-08-15 VITALS — Temp 96.8°F | Ht 69.0 in | Wt 241.0 lb

## 2019-08-15 DIAGNOSIS — Z800 Family history of malignant neoplasm of digestive organs: Secondary | ICD-10-CM

## 2019-08-15 DIAGNOSIS — Z86010 Personal history of colonic polyps: Secondary | ICD-10-CM

## 2019-08-15 DIAGNOSIS — Z1159 Encounter for screening for other viral diseases: Secondary | ICD-10-CM

## 2019-08-15 MED ORDER — PEG 3350-KCL-NA BICARB-NACL 420 G PO SOLR: 4000.0000 mL | Freq: Once | ORAL | 0 refills | Status: AC

## 2019-08-15 NOTE — Progress Notes (Signed)
No egg or soy allergy known to patient  No issues with past sedation with any surgeries  or procedures, no intubation problems  No diet pills per patient No home 02 use per patient  No blood thinners per patient  Pt denies issues with constipation  No A fib or A flutter  EMMI video sent to pt's e mail   covid test 10-26 Monday at 9 am- sch with pt in [place] today   Due to the COVID-19 pandemic we are asking patients to follow these guidelines. Please only bring one care partner. Please be aware that your care partner may wait in the car in the parking lot or if they feel like they will be too hot to wait in the car, they may wait in the lobby on the 4th floor. All care partners are required to wear a mask the entire time (we do not have any that we can provide them), they need to practice social distancing, and we will do a Covid check for all patient's and care partners when you arrive. Also we will check their temperature and your temperature. If the care partner waits in their car they need to stay in the parking lot the entire time and we will call them on their cell phone when the patient is ready for discharge so they can bring the car to the front of the building. Also all patient's will need to wear a mask into building.

## 2019-08-20 NOTE — Progress Notes (Signed)
Patient: Zachary Martinez Male    DOB: December 23, 1940   78 y.o.   MRN: [id number] Visit Date: 08/22/2019  Today's Provider: [name], MD   Chief Complaint  Patient presents with  . Diabetes  . Hypertension  . Hyperlipidemia  . Sleep Apnea   Subjective:     HPI  Patient is feeling well.  He has no significant complaints. Type 2 diabetes mellitus with hyperglycemia, without long-term current use of insulin ([facility]) From 04/22/2019-Much improved, no changes. Hb A1C 7.1.  RTC 4 months.  Lab Results  Component Value Date   HGBA1C 6.8 (A) 08/22/2019   HGBA1C 7.1 (A) 04/22/2019   HGBA1C 8.3 (A) 12/27/2018    Essential hypertension From 04/22/2019-labs checked. Labs checked, stable--follow up Calcium and PTH next visit. BP Readings from Last 3 Encounters:  08/22/19 130/68  04/22/19 134/68  12/27/18 132/70   Arteriosclerosis of coronary artery From 04/22/2019-All risk factors treated. Labs checked, stable--follow up Calcium and PTH next visit.  Other hyperlipidemia From 04/22/2019-Labs checked, stable--follow up Calcium and PTH next visit.  Obstructive sleep apnea syndrome From 04/22/2019-Using CPAP machine nightly.  Continue to work on lifestyle and weight loss.   Allergies  Allergen Reactions  . Ace Inhibitors Anaphylaxis and Other (See Comments)     Current Outpatient Medications:  .  amLODipine (NORVASC) 10 MG tablet, TAKE 1 TABLET EVERY DAY, Disp: 90 tablet, Rfl: 3 .  atorvastatin (LIPITOR) 40 MG tablet, TAKE 1 TABLET EVERY DAY, Disp: 90 tablet, Rfl: 3 .  carvedilol (COREG) 6.25 MG tablet, Take 1 tablet (6.25 mg total) by mouth 2 (two) times daily., Disp: 180 tablet, Rfl: 1 .  Cholecalciferol (VITAMIN D3) 1000 UNITS CAPS, Take 2,000 Units by mouth daily. , Disp: , Rfl:  .  EPINEPHRINE, ANAPHYLAXIS THERAPY AGENTS,, EPIPEN 2-PAK, 0.3MG /0.3ML (Injection Device) - Historical Medication  as directed (0.3 MG/0.3ML) Active, Disp: , Rfl:  .  furosemide (LASIX) 20 MG  tablet, Take 1 tablet by mouth every morning., Disp: , Rfl:  .  hydrALAZINE (APRESOLINE) 100 MG tablet, Take 1 tablet by mouth 2 (two) times daily. , Disp: , Rfl:  .  INVOKAMET [id number] MG TABS, TAKE 1 TABLET TWICE DAILY, Disp: 180 tablet, Rfl: 3 .  levothyroxine (SYNTHROID, LEVOTHROID) 88 MCG tablet, TAKE 1 TABLET EVERY DAY, Disp: 90 tablet, Rfl: 3 .  metFORMIN (GLUCOPHAGE) 1000 MG tablet, Take 1,000 mg by mouth daily with breakfast., Disp: , Rfl:  .  pioglitazone (ACTOS) 30 MG tablet, TAKE 1 TABLET EVERY DAY, Disp: 90 tablet, Rfl: 3 .  prednisoLONE acetate (PRED FORTE) 1 % ophthalmic suspension, , Disp: , Rfl:  .  valACYclovir (VALTREX) 1000 MG tablet, , Disp: , Rfl:   Review of Systems  Constitutional: Negative for appetite change, chills and fever.  Eyes: Negative.   Respiratory: Negative for chest tightness, shortness of breath and wheezing.   Cardiovascular: Negative for chest pain and palpitations.  Gastrointestinal: Negative for abdominal pain, nausea and vomiting.  Endocrine: Negative.   Musculoskeletal: Positive for arthralgias.  Allergic/Immunologic: Negative.   Neurological: Negative.   Hematological: Negative.   Psychiatric/Behavioral: Negative.     Social History   Tobacco Use  . Smoking status: Former Smoker    Quit date: 07/24/1979    Years since quitting: 40.1  . Smokeless tobacco: Former Systems developer    Quit date: 11/01/1979  Substance Use Topics  . Alcohol use: Not Currently      Objective:   BP 130/68  Pulse (!) 54   Temp 98 F (36.7 C)   Resp 16   Ht 5\' 9"  (1.753 m)   Wt 240 lb (108.9 kg)   SpO2 98%   BMI 35.44 kg/m  Vitals:   08/22/19 0825  BP: 130/68  Pulse: (!) 54  Resp: 16  Temp: 98 F (36.7 C)  SpO2: 98%  Weight: 240 lb (108.9 kg)  Height: 5\' 9"  (1.753 m)  Body mass index is 35.44 kg/m.   Physical Exam Vitals signs reviewed.  Constitutional:      Appearance: He is well-developed.  HENT:     Head: Normocephalic and atraumatic.      Right Ear: External ear normal.     Left Ear: External ear normal.     Nose: Nose normal.  Eyes:     General: No scleral icterus.    Conjunctiva/sclera: Conjunctivae normal.  Neck:     Thyroid: No thyromegaly.  Cardiovascular:     Rate and Rhythm: Normal rate and regular rhythm.     Heart sounds: Normal heart sounds.  Pulmonary:     Effort: Pulmonary effort is normal.     Breath sounds: Normal breath sounds.  Abdominal:     Palpations: Abdomen is soft.  Musculoskeletal:     Comments: 1+ bilateral edema.  Skin:    General: Skin is warm and dry.  Neurological:     Mental Status: He is alert and oriented to person, place, and time.  Psychiatric:        Behavior: Behavior normal.        Thought Content: Thought content normal.        Judgment: Judgment normal.      Results for orders placed or performed in visit on 08/22/19  POCT glycosylated hemoglobin (Hb A1C)  Result Value Ref Range   Hemoglobin A1C 6.8 (A) 4.0 - 5.6 %   HbA1c POC (<> result, manual entry)     HbA1c, POC (prediabetic range)     HbA1c, POC (controlled diabetic range)     Est. average glucose Bld gHb Est-mCnc 148        Assessment & Plan    1. Type 2 diabetes mellitus with hyperglycemia, without long-term current use of insulin ([facility]) Stable at 6.8. Follow up in 4 months.   - POCT glycosylated hemoglobin (Hb A1C)  2. Essential hypertension Stable continue current medications.   3. Other hyperlipidemia Stable.  4. Obstructive sleep apnea syndrome Patient is using CPAP machine nightly and benefiting from this.  5.Obesity I, Rachelle L. Presley, CMA, am acting as a Education administrator for [employer]. Rosanna Randy, [place]    [name], MD  [facility]

## 2019-08-22 ENCOUNTER — Ambulatory Visit (INDEPENDENT_AMBULATORY_CARE_PROVIDER_SITE_OTHER): Payer: Medicare Other | Admitting: Family Medicine

## 2019-08-22 ENCOUNTER — Other Ambulatory Visit: Payer: Self-pay

## 2019-08-22 ENCOUNTER — Encounter: Payer: Self-pay | Admitting: Family Medicine

## 2019-08-22 VITALS — BP 130/68 | HR 54 | Temp 98.0°F | Resp 16 | Ht 69.0 in | Wt 240.0 lb

## 2019-08-22 DIAGNOSIS — G4733 Obstructive sleep apnea (adult) (pediatric): Secondary | ICD-10-CM

## 2019-08-22 DIAGNOSIS — I2510 Atherosclerotic heart disease of native coronary artery without angina pectoris: Secondary | ICD-10-CM | POA: Diagnosis not present

## 2019-08-22 DIAGNOSIS — I10 Essential (primary) hypertension: Secondary | ICD-10-CM | POA: Diagnosis not present

## 2019-08-22 DIAGNOSIS — E1165 Type 2 diabetes mellitus with hyperglycemia: Secondary | ICD-10-CM

## 2019-08-22 DIAGNOSIS — E7849 Other hyperlipidemia: Secondary | ICD-10-CM | POA: Diagnosis not present

## 2019-08-22 LAB — POCT GLYCOSYLATED HEMOGLOBIN (HGB A1C)
Est. average glucose Bld gHb Est-mCnc: 148
Hemoglobin A1C: 6.8 % — AB (ref 4.0–5.6)

## 2019-08-23 ENCOUNTER — Telehealth: Payer: Self-pay | Admitting: *Deleted

## 2019-08-23 DIAGNOSIS — Z1159 Encounter for screening for other viral diseases: Secondary | ICD-10-CM

## 2019-08-23 NOTE — Telephone Encounter (Signed)
Covid test ordered again per [facility] request  

## 2019-08-27 ENCOUNTER — Telehealth: Payer: Self-pay | Admitting: Gastroenterology

## 2019-08-27 DIAGNOSIS — Z1159 Encounter for screening for other viral diseases: Secondary | ICD-10-CM | POA: Diagnosis not present

## 2019-08-27 NOTE — Telephone Encounter (Signed)
Pt is scheduled for a procedure this coming Thursday. He missed his appt for Covid test because he forgot about it. Pls call him.

## 2019-08-27 NOTE — Telephone Encounter (Signed)
Returned pts call and rescheduled his appointment for 9:40am today.  Pt verbalized understanding and was aware of address.

## 2019-08-28 LAB — SARS CORONAVIRUS 2 (TAT 6-24 HRS): SARS Coronavirus 2: NEGATIVE

## 2019-08-29 ENCOUNTER — Other Ambulatory Visit: Payer: Self-pay | Admitting: Gastroenterology

## 2019-08-29 ENCOUNTER — Encounter: Payer: Self-pay | Admitting: Gastroenterology

## 2019-08-29 ENCOUNTER — Other Ambulatory Visit: Payer: Self-pay

## 2019-08-29 ENCOUNTER — Ambulatory Visit (AMBULATORY_SURGERY_CENTER): Payer: Medicare Other | Admitting: Gastroenterology

## 2019-08-29 VITALS — BP 134/64 | HR 52 | Temp 97.5°F | Resp 15 | Ht 69.0 in | Wt 241.0 lb

## 2019-08-29 DIAGNOSIS — D123 Benign neoplasm of transverse colon: Secondary | ICD-10-CM | POA: Diagnosis not present

## 2019-08-29 DIAGNOSIS — Z800 Family history of malignant neoplasm of digestive organs: Secondary | ICD-10-CM | POA: Diagnosis not present

## 2019-08-29 DIAGNOSIS — Z86010 Personal history of colonic polyps: Secondary | ICD-10-CM | POA: Diagnosis not present

## 2019-08-29 DIAGNOSIS — Z1211 Encounter for screening for malignant neoplasm of colon: Secondary | ICD-10-CM | POA: Diagnosis not present

## 2019-08-29 MED ORDER — SODIUM CHLORIDE 0.9 % IV SOLN: 500.0000 mL | Freq: Once | INTRAVENOUS | Status: DC

## 2019-08-29 NOTE — Op Note (Signed)
[facility] Patient Name: Zachary Martinez Procedure Date: 08/29/2019 10:40 AM MRN: [id number] Endoscopist: Ladene Artist , MD Age: 78 Referring MD:  Date of Birth: [birth year] Gender: Male Account #: [id number] Procedure:                Colonoscopy Indications:              Surveillance: Personal history of adenomatous                            polyps on last colonoscopy 5 years ago. Family                            history of colon cancer, 1st degree and distant                            relatives. Medicines:                Monitored Anesthesia Care Procedure:                Pre-Anesthesia Assessment:                           - Prior to the procedure, a History and Physical                            was performed, and patient medications and                            allergies were reviewed. The patient's tolerance of                            previous anesthesia was also reviewed. The risks                            and benefits of the procedure and the sedation                            options and risks were discussed with the patient.                            All questions were answered, and informed consent                            was obtained. Prior Anticoagulants: The patient has                            taken no previous anticoagulant or antiplatelet                            agents. ASA Grade Assessment: II - A patient with                            mild systemic disease. After reviewing the risks  and benefits, the patient was deemed in                            satisfactory condition to undergo the procedure.                           After obtaining informed consent, the colonoscope                            was passed under direct vision. Throughout the                            procedure, the patient's blood pressure, pulse, and                            oxygen saturations were monitored continuously. The                             Colonoscope was introduced through the anus and                            advanced to the the cecum, identified by                            appendiceal orifice and ileocecal valve. The                            colonoscopy was performed without difficulty. The                            patient tolerated the procedure well. The quality                            of the bowel preparation was good. Scope In: 10:43:14 AM Scope Out: 11:01:23 AM Scope Withdrawal Time: 0 hours 14 minutes 16 seconds  Total Procedure Duration: 0 hours 18 minutes 9 seconds  Findings:                 The perianal and digital rectal examinations were                            normal.                           Two sessile polyps were found in the transverse                            colon. The polyps were 5 to 6 mm in size. These                            polyps were removed with a cold snare. Resection                            and retrieval were complete.  Multiple medium-mouthed diverticula were found in                            the entire colon. There was no evidence of                            diverticular bleeding.                           Internal hemorrhoids were found during                            retroflexion. The hemorrhoids were moderate and                            Grade I (internal hemorrhoids that do not prolapse).                           The exam was otherwise normal throughout the                            examined colon. Complications:            No immediate complications. Estimated Blood Loss:     Estimated blood loss: none. Impression:               - Two 5 to 6 mm polyps in the transverse colon,                            removed with a cold snare. Resected and retrieved.                           - Moderate diverticulosis in the entire examined                            colon.                           - Internal  hemorrhoids. Recommendation:           - Patient has a contact number available for                            emergencies. The signs and symptoms of potential                            delayed complications were discussed with the                            patient. Return to normal activities tomorrow.                            Written discharge instructions were provided to the                            patient.                           -  High fiber diet.                           - Continue present medications.                           - Await pathology results.                           - No repeat colonoscopy due to age. Ladene Artist, MD 08/29/2019 11:08:00 AM This report has been signed electronically.

## 2019-08-29 NOTE — Progress Notes (Signed)
Vitals-CW Temp-JB  covid negative  Pt's states no medical or surgical changes since previsit or office visit.   

## 2019-08-29 NOTE — Progress Notes (Signed)
To PACU, VSS. Report to [email or web address] 

## 2019-08-29 NOTE — Patient Instructions (Signed)
Thank you for allowing [place] to care for you today!  Await pathology results by mail, approximately 2 weeks.  No further routine colonoscopies due to current age guidelines.  Please contact us if you develop any symptoms or issues.  Resume previous diet and medications today.  Return to your normal activities tomorrow.  Recommend incorporating high fiber foods into your diet.  Handout provided.      YOU HAD AN ENDOSCOPIC PROCEDURE TODAY AT [facility]:   Refer to the procedure report that was given to you for any specific questions about what was found during the examination.  If the procedure report does not answer your questions, please call your gastroenterologist to clarify.  If you requested that your care partner not be given the details of your procedure findings, then the procedure report has been included in a sealed envelope for you to review at your convenience later.  YOU SHOULD EXPECT: Some feelings of bloating in the abdomen. Passage of more gas than usual.  Walking can help get rid of the air that was put into your GI tract during the procedure and reduce the bloating. If you had a lower endoscopy (such as a colonoscopy or flexible sigmoidoscopy) you may notice spotting of blood in your stool or on the toilet paper. If you underwent a bowel prep for your procedure, you may not have a normal bowel movement for a few days.  Please Note:  You might notice some irritation and congestion in your nose or some drainage.  This is from the oxygen used during your procedure.  There is no need for concern and it should clear up in a day or so.  SYMPTOMS TO REPORT IMMEDIATELY:   Following lower endoscopy (colonoscopy or flexible sigmoidoscopy):  Excessive amounts of blood in the stool  Significant tenderness or worsening of abdominal pains  Swelling of the abdomen that is new, acute  Fever of 100F or higher   For urgent or emergent issues, a gastroenterologist can be  reached at any hour by calling [phone].   DIET:  We do recommend a small meal at first, but then you may proceed to your regular diet.  Drink plenty of fluids but you should avoid alcoholic beverages for 24 hours.  ACTIVITY:  You should plan to take it easy for the rest of today and you should NOT DRIVE or use heavy machinery until tomorrow (because of the sedation medicines used during the test).    FOLLOW UP: Our staff will call the number listed on your records 48-72 hours following your procedure to check on you and address any questions or concerns that you may have regarding the information given to you following your procedure. If we do not reach you, we will leave a message.  We will attempt to reach you two times.  During this call, we will ask if you have developed any symptoms of COVID 19. If you develop any symptoms (ie: fever, flu-like symptoms, shortness of breath, cough etc.) before then, please call [phone].  If you test positive for Covid 19 in the 2 weeks post procedure, please call and report this information to [place].    If any biopsies were taken you will be contacted by phone or by letter within the next 1-3 weeks.  Please call us at [phone] if you have not heard about the biopsies in 3 weeks.    SIGNATURES/CONFIDENTIALITY: You and/or your care partner have signed paperwork which will be entered  into your electronic medical record.  These signatures attest to the fact that that the information above on your After Visit Summary has been reviewed and is understood.  Full responsibility of the confidentiality of this discharge information lies with you and/or your care-partner.

## 2019-09-02 ENCOUNTER — Telehealth: Payer: Self-pay

## 2019-09-02 NOTE — Telephone Encounter (Signed)
  Follow up Call-  Call back number 08/29/2019  Post procedure Call Back phone  # [phone]  Permission to leave phone message Yes  Some recent data might be hidden     Patient questions:  Do you have a fever, pain , or abdominal swelling? No. Pain Score  0 *  Have you tolerated food without any problems? Yes.    Have you been able to return to your normal activities? Yes.    Do you have any questions about your discharge instructions: Diet   No. Medications  No. Follow up visit  No.  Do you have questions or concerns about your Care? No.  Actions: * If pain score is 4 or above: No action needed, pain <4. 1. Have you developed a fever since your procedure? no  2.   Have you had an respiratory symptoms (SOB or cough) since your procedure? no  3.   Have you tested positive for COVID 19 since your procedure no  4.   Have you had any family members/close contacts diagnosed with the COVID 19 since your procedure?  no   If yes to any of these questions please route to Joylene John, RN and Alphonsa Gin, Therapist, sports.

## 2019-09-05 ENCOUNTER — Encounter: Payer: Self-pay | Admitting: Gastroenterology

## 2019-09-09 DIAGNOSIS — H31101 Choroidal degeneration, unspecified, right eye: Secondary | ICD-10-CM | POA: Diagnosis not present

## 2019-09-09 DIAGNOSIS — H2512 Age-related nuclear cataract, left eye: Secondary | ICD-10-CM | POA: Diagnosis not present

## 2019-09-09 LAB — HM DIABETES EYE EXAM

## 2019-09-12 ENCOUNTER — Other Ambulatory Visit: Payer: Self-pay | Admitting: Family Medicine

## 2019-09-12 ENCOUNTER — Encounter: Payer: Self-pay | Admitting: *Deleted

## 2019-09-12 DIAGNOSIS — E7849 Other hyperlipidemia: Secondary | ICD-10-CM

## 2019-11-04 NOTE — Progress Notes (Signed)
Subjective:   Zachary Martinez is a 79 y.o. male who presents for Medicare Annual/Subsequent preventive examination.    This visit is being conducted through telemedicine due to the COVID-19 pandemic. This patient has given me verbal consent via doximity to conduct this visit, patient states they are participating from their home address. Some vital signs may be absent or patient reported.    Patient identification: identified by name, DOB, and current address  Review of Systems:  N/A  Cardiac Risk Factors include: advanced age (>68men, >12 women);diabetes mellitus;dyslipidemia;male gender;hypertension     Objective:    Vitals: There were no vitals taken for this visit.  There is no height or weight on file to calculate BMI. Unable to obtain vitals due to visit being conducted via telephonically.   Advanced Directives 11/05/2019 10/22/2018 10/19/2017 11/01/2016 06/07/2016 07/31/2014  Does Patient Have a Medical Advance Directive? Yes Yes Yes No No No;Yes  Type of Paramedic of [place];Living will [place];Living will [place];Living will - - Living will  Copy of [place] in Chart? Yes - validated most recent copy scanned in chart (See row information) Yes - validated most recent copy scanned in chart (See row information) Yes - - -  Would patient like information on creating a medical advance directive? - - - No - Patient declined - -    Tobacco Social History   Tobacco Use  Smoking Status Former Smoker  . Quit date: 07/24/1979  . Years since quitting: 40.3  Smokeless Tobacco Former Systems developer  . Quit date: 11/01/1979     Counseling given: Not Answered   Clinical Intake:  Pre-visit preparation completed: Yes  Pain : No/denies pain Pain Score: 0-No pain     Nutritional Risks: None Diabetes: Yes  How often do you need to have someone help you when you read instructions, pamphlets, or other written  materials from your doctor or pharmacy?: 1 - Never   Diabetes:  Is the patient diabetic?  Yes  If diabetic, was a CBG obtained today?  No  Did the patient bring in their glucometer from home?  No  How often do you monitor your CBG's? Once a month.   Financial Strains and Diabetes Management:  Are you having any financial strains with the device, your supplies or your medication? No .  Does the patient want to be seen by Chronic Care Management for management of their diabetes?  No  Would the patient like to be referred to a Nutritionist or for Diabetic Management?  No   Diabetic Exams:  Diabetic Eye Exam: Completed 09/09/19. Repeat yearly.  Diabetic Foot Exam: Completed 02/09/17. Pt has been advised about the importance in completing this exam. Note made to follow up on this at next in office visit.    Interpreter Needed?: No  Information entered by :: [facility], LPN  Past Medical History:  Diagnosis Date  . Arthritis    knees   . CAD (coronary artery disease)   . Cataract    removed right eye. left eye forming   . Diabetes ([place])   . Diverticulosis   . Hyperlipidemia   . Hypertension   . Hypothyroidism   . Neuromuscular disorder (H[facility])    neuropathy  . Sleep apnea    cpap q night   Past Surgical History:  Procedure Laterality Date  . CATARACT EXTRACTION W/PHACO Right 11/01/2016   Procedure: CATARACT EXTRACTION PHACO AND INTRAOCULAR LENS PLACEMENT (IOC);  Surgeon: Birder Robson, MD;  Location: [facility] ORS;  Service: Ophthalmology;  Laterality: Right;  Lot #[id number] H [place]: oo:35.4 AP%; 22.4 CDE: 7.91  . COLONOSCOPY    . CYST REMOVAL NECK    . EYE SURGERY     to remove blood from busted blood vessel  . KNEE ARTHROSCOPY Right 2003, 2005   x2  . KNEE ARTHROSCOPY Left 2013  . nasal polyps  1978  . POLYPECTOMY    . TIBIA FRACTURE SURGERY Left 2012  . TONSILLECTOMY AND ADENOIDECTOMY    . uvuloplasty  2000   Family History  Problem Relation Age of Onset  . Colon cancer  Brother 61  . Colon cancer Maternal Aunt 33  . Colon cancer Maternal Aunt 48  . Kidney disease Mother   . Anemia Mother   . Diabetes Brother   . Hyperlipidemia Brother   . Hypertension Brother   . Colon polyps Brother   . Esophageal cancer Neg Hx   . Rectal cancer Neg Hx   . Stomach cancer Neg Hx    Social History   Socioeconomic History  . Marital status: Married    Spouse name: Not on file  . Number of children: 3  . Years of education: Not on file  . Highest education level: Associate degree: occupational, Hotel manager, or vocational program  Occupational History  . Occupation: retired  Tobacco Use  . Smoking status: Former Smoker    Quit date: 07/24/1979    Years since quitting: 40.3  . Smokeless tobacco: Former Systems developer    Quit date: 11/01/1979  Substance and Sexual Activity  . Alcohol use: Not Currently  . Drug use: No  . Sexual activity: Not on file  Other Topics Concern  . Not on file  Social History Narrative  . Not on file   Social Determinants of Health   Financial Resource Strain: Low Risk   . Difficulty of Paying Living Expenses: Not hard at all  Food Insecurity: No Food Insecurity  . Worried About Charity fundraiser in the Last Year: Never true  . Ran Out of Food in the Last Year: Never true  Transportation Needs: No Transportation Needs  . Lack of Transportation (Medical): No  . Lack of Transportation (Non-Medical): No  Physical Activity: Inactive  . Days of Exercise per Week: 0 days  . Minutes of Exercise per Session: 0 min  Stress: No Stress Concern Present  . Feeling of Stress : Only a little  Social Connections: Not Isolated  . Frequency of Communication with Friends and Family: More than three times a week  . Frequency of Social Gatherings with Friends and Family: Once a week  . Attends Religious Services: More than 4 times per year  . Active Member of Clubs or Organizations: Yes  . Attends Archivist Meetings: More than 4 times per year    . Marital Status: Married    Outpatient Encounter Medications as of 11/05/2019  Medication Sig  . amLODipine (NORVASC) 10 MG tablet TAKE 1 TABLET EVERY DAY  . atorvastatin (LIPITOR) 40 MG tablet TAKE 1 TABLET EVERY DAY  . carvedilol (COREG) 6.25 MG tablet Take 1 tablet (6.25 mg total) by mouth 2 (two) times daily.  . Cholecalciferol (VITAMIN D3) 1000 UNITS CAPS Take 2,000 Units by mouth daily.   Marland Kitchen EPINEPHRINE, ANAPHYLAXIS THERAPY AGENTS, EPIPEN 2-PAK, 0.3MG /0.3ML (Injection Device) - Historical Medication  as directed (0.3 MG/0.3ML) Active  . furosemide (LASIX) 20 MG tablet Take 1 tablet by mouth every morning.  Marland Kitchen  hydrALAZINE (APRESOLINE) 100 MG tablet Take 1 tablet by mouth 2 (two) times daily.   . INVOKAMET [phone] MG TABS TAKE 1 TABLET TWICE DAILY  . levothyroxine (SYNTHROID, LEVOTHROID) 88 MCG tablet TAKE 1 TABLET EVERY DAY  . pioglitazone (ACTOS) 30 MG tablet TAKE 1 TABLET EVERY DAY  . metFORMIN (GLUCOPHAGE) 1000 MG tablet Take 1,000 mg by mouth daily with breakfast.  . prednisoLONE acetate (PRED FORTE) 1 % ophthalmic suspension   . valACYclovir (VALTREX) 1000 MG tablet    No facility-administered encounter medications on file as of 11/05/2019.    Activities of Daily Living In your present state of health, do you have any difficulty performing the following activities: 11/05/2019  Hearing? N  Vision? Y  Comment Due to previous blood vessel rupture behind the right eye.  Difficulty concentrating or making decisions? N  Walking or climbing stairs? N  Dressing or bathing? N  Doing errands, shopping? N  Preparing Food and eating ? N  Using the Toilet? N  In the past six months, have you accidently leaked urine? Y  Comment Occasionally due to taking Lasix.  Do you have problems with loss of bowel control? Y  Comment Rarely  Managing your Medications? N  Managing your Finances? N  Housekeeping or managing your Housekeeping? N  Some recent data might be hidden    Patient Care  Team: Jerrol Banana., MD as PCP - General (Family Medicine) Birder Robson, MD as Referring Physician (Ophthalmology) Corey Skains, MD as Consulting Physician (Cardiology) Hessie Knows, MD as Consulting Physician (Orthopedic Surgery) Dasher, Rayvon Char, MD (Dermatology)   Assessment:   This is a routine wellness examination for [place].  Exercise Activities and Dietary recommendations Current Exercise Habits: The patient does not participate in regular exercise at present, Exercise limited by: None identified  Goals    . DIET - REDUCE PORTION SIZE     Recommend cutting portion sizes in half and eating 3 small meals a day with 2 healthy snacks in between.     . Exercise 3x per week (30 min per time)     Recommend to exercise for 3 days a week for at least 30 minutes at a time.         Fall Risk Fall Risk  11/05/2019 10/22/2018 10/19/2017 10/18/2016 10/08/2015  Falls in the past year? 0 0 Yes No No  Number falls in past yr: 0 0 1 - -  Comment - - tripped over a tree - -  Injury with Fall? 0 0 No - -  Follow up - - Falls prevention discussed - -   FALL RISK PREVENTION PERTAINING TO THE HOME:  Any stairs in or around the home? Yes  If so, are there any without handrails? No   Home free of loose throw rugs in walkways, pet beds, electrical cords, etc? Yes  Adequate lighting in your home to reduce risk of falls? Yes   ASSISTIVE DEVICES UTILIZED TO PREVENT FALLS:  Life alert? No  Use of a cane, walker or w/c? No  Grab bars in the bathroom? Yes  Shower chair or bench in shower? Yes  Elevated toilet seat or a handicapped toilet? Yes    TIMED UP AND GO:  Was the test performed? No .    Depression Screen PHQ 2/9 Scores 11/05/2019 11/05/2019 10/22/2018 10/19/2017  PHQ - 2 Score 0 0 0 0    Cognitive Function: Declined today.      6CIT Screen 10/22/2018  What  Year? 0 points  What month? 0 points  What time? 0 points  Count back from 20 0 points  Months in  reverse 0 points  Repeat phrase 0 points  Total Score 0    Immunization History  Administered Date(s) Administered  . Influenza Split 08/21/2012  . Influenza, High Dose Seasonal PF 07/22/2014, 08/17/2017, 07/17/2019  . Influenza-Unspecified 09/07/2015, 08/15/2018  . Pneumococcal Conjugate-13 06/16/2014  . Pneumococcal Polysaccharide-23 06/18/2012  . Td 11/21/2003, 01/24/2015  . Zoster 06/18/2012    Qualifies for Shingles Vaccine? Yes  Zostavax completed 06/18/12. Due for Shingrix. Pt has been advised to call insurance company to determine out of pocket expense. Advised may also receive vaccine at local pharmacy or Health Dept. Verbalized acceptance and understanding.  Tdap: Up to date  Flu Vaccine: Up to date  Pneumococcal Vaccine: Completed series  Screening Tests Health Maintenance  Topic Date Due  . FOOT EXAM  02/09/2018  . HEMOGLOBIN A1C  02/20/2020  . OPHTHALMOLOGY EXAM  09/08/2020  . TETANUS/TDAP  01/23/2025  . INFLUENZA VACCINE  Completed  . PNA vac Low Risk Adult  Completed   Cancer Screenings:  Colorectal Screening: No longer required.   Lung Cancer Screening: (Low Dose CT Chest recommended if Age 78-80 years, 30 pack-year currently smoking OR have quit w/in 15years.) does not qualify.   Additional Screening:  Dental Screening: Recommended annual dental exams for proper oral hygiene  Community Resource Referral:  CRR required this visit?  No        Plan:  I have personally reviewed and addressed the Medicare Annual Wellness questionnaire and have noted the following in the patient's chart:  A. Medical and social history B. Use of alcohol, tobacco or illicit drugs  C. Current medications and supplements D. Functional ability and status E.  Nutritional status F.  Physical activity G. Advance directives H. List of other physicians I.  Hospitalizations, surgeries, and ER visits in previous 12 months J.  [place] such as hearing and vision  if needed, cognitive and depression L. Referrals and appointments   In addition, I have reviewed and discussed with patient certain preventive protocols, quality metrics, and best practice recommendations. A written personalized care plan for preventive services as well as general preventive health recommendations were provided to patient.   [place] Score, [place]  0/01/8888 Nurse Health Advisor  Nurse Notes: Pt needs a diabetic foot exam at next in office apt.

## 2019-11-05 ENCOUNTER — Ambulatory Visit (INDEPENDENT_AMBULATORY_CARE_PROVIDER_SITE_OTHER): Payer: Medicare Other

## 2019-11-05 ENCOUNTER — Other Ambulatory Visit: Payer: Self-pay

## 2019-11-05 DIAGNOSIS — Z0000 Encounter for general adult medical examination without abnormal findings: Secondary | ICD-10-CM

## 2019-11-05 NOTE — Patient Instructions (Addendum)
Mr. Zachary Martinez , Thank you for taking time to come for your Medicare Wellness Visit. I appreciate your ongoing commitment to your health goals. Please review the following plan we discussed and let me know if I can assist you in the future.   Screening recommendations/referrals: Colonoscopy: No longer required.  Recommended yearly ophthalmology/optometry visit for glaucoma screening and checkup Recommended yearly dental visit for hygiene and checkup  Vaccinations: Influenza vaccine: Up to date Pneumococcal vaccine: Completed series Tdap vaccine: Up to date, due 12/2024 Shingles vaccine: Pt declines today.     Advanced directives: Currently on file.   Conditions/risks identified: Recommend to start exercising 3 days a week for at least 30 minutes at a time.   Next appointment: 12/26/19 @ 8:00 AM with Dr Rosanna Randy. Declined scheduling an AWV for 2022 at this time.   Preventive Care 26 Years and Older, Male Preventive care refers to lifestyle choices and visits with your health care provider that can promote health and wellness. What does preventive care include?  A yearly physical exam. This is also called an annual well check.  Dental exams once or twice a year.  Routine eye exams. Ask your health care provider how often you should have your eyes checked.  Personal lifestyle choices, including:  Daily care of your teeth and gums.  Regular physical activity.  Eating a healthy diet.  Avoiding tobacco and drug use.  Limiting alcohol use.  Practicing safe sex.  Taking low doses of aspirin every day.  Taking vitamin and mineral supplements as recommended by your health care provider. What happens during an annual well check? The services and screenings done by your health care provider during your annual well check will depend on your age, overall health, lifestyle risk factors, and family history of disease. Counseling  Your health care provider may ask you questions about  your:  Alcohol use.  Tobacco use.  Drug use.  Emotional well-being.  Home and relationship well-being.  Sexual activity.  Eating habits.  History of falls.  Memory and ability to understand (cognition).  Work and work Statistician. Screening  You may have the following tests or measurements:  Height, weight, and BMI.  Blood pressure.  Lipid and cholesterol levels. These may be checked every 5 years, or more frequently if you are over 47 years old.  Skin check.  Lung cancer screening. You may have this screening every year starting at age 88 if you have a 30-pack-year history of smoking and currently smoke or have quit within the past 15 years.  Fecal occult blood test (FOBT) of the stool. You may have this test every year starting at age 26.  Flexible sigmoidoscopy or colonoscopy. You may have a sigmoidoscopy every 5 years or a colonoscopy every 10 years starting at age 38.  Prostate cancer screening. Recommendations will vary depending on your family history and other risks.  Hepatitis C blood test.  Hepatitis B blood test.  Sexually transmitted disease (STD) testing.  Diabetes screening. This is done by checking your blood sugar (glucose) after you have not eaten for a while (fasting). You may have this done every 1-3 years.  Abdominal aortic aneurysm (AAA) screening. You may need this if you are a current or former smoker.  Osteoporosis. You may be screened starting at age 91 if you are at high risk. Talk with your health care provider about your test results, treatment options, and if necessary, the need for more tests. Vaccines  Your health care provider may recommend  certain vaccines, such as:  Influenza vaccine. This is recommended every year.  Tetanus, diphtheria, and acellular pertussis (Tdap, Td) vaccine. You may need a Td booster every 10 years.  Zoster vaccine. You may need this after age 40.  Pneumococcal 13-valent conjugate (PCV13) vaccine.  One dose is recommended after age 73.  Pneumococcal polysaccharide (PPSV23) vaccine. One dose is recommended after age 47. Talk to your health care provider about which screenings and vaccines you need and how often you need them. This information is not intended to replace advice given to you by your health care provider. Make sure you discuss any questions you have with your health care provider. Document Released: 11/13/2015 Document Revised: 07/06/2016 Document Reviewed: 08/18/2015 Elsevier Interactive Patient Education  2017 [place] Prevention in the Home Falls can cause injuries. They can happen to people of all ages. There are many things you can do to make your home safe and to help prevent falls. What can I do on the outside of my home?  Regularly fix the edges of walkways and driveways and fix any cracks.  Remove anything that might make you trip as you walk through a door, such as a raised step or threshold.  Trim any bushes or trees on the path to your home.  Use bright outdoor lighting.  Clear any walking paths of anything that might make someone trip, such as rocks or tools.  Regularly check to see if handrails are loose or broken. Make sure that both sides of any steps have handrails.  Any raised decks and porches should have guardrails on the edges.  Have any leaves, snow, or ice cleared regularly.  Use sand or salt on walking paths during winter.  Clean up any spills in your garage right away. This includes oil or grease spills. What can I do in the bathroom?  Use night lights.  Install grab bars by the toilet and in the tub and shower. Do not use towel bars as grab bars.  Use non-skid mats or decals in the tub or shower.  If you need to sit down in the shower, use a plastic, non-slip stool.  Keep the floor dry. Clean up any water that spills on the floor as soon as it happens.  Remove soap buildup in the tub or shower regularly.  Attach bath  mats securely with double-sided non-slip rug tape.  Do not have throw rugs and other things on the floor that can make you trip. What can I do in the bedroom?  Use night lights.  Make sure that you have a light by your bed that is easy to reach.  Do not use any sheets or blankets that are too big for your bed. They should not hang down onto the floor.  Have a firm chair that has side arms. You can use this for support while you get dressed.  Do not have throw rugs and other things on the floor that can make you trip. What can I do in the kitchen?  Clean up any spills right away.  Avoid walking on wet floors.  Keep items that you use a lot in easy-to-reach places.  If you need to reach something above you, use a strong step stool that has a grab bar.  Keep electrical cords out of the way.  Do not use floor polish or wax that makes floors slippery. If you must use wax, use non-skid floor wax.  Do not have throw rugs and other  things on the floor that can make you trip. What can I do with my stairs?  Do not leave any items on the stairs.  Make sure that there are handrails on both sides of the stairs and use them. Fix handrails that are broken or loose. Make sure that handrails are as long as the stairways.  Check any carpeting to make sure that it is firmly attached to the stairs. Fix any carpet that is loose or worn.  Avoid having throw rugs at the top or bottom of the stairs. If you do have throw rugs, attach them to the floor with carpet tape.  Make sure that you have a light switch at the top of the stairs and the bottom of the stairs. If you do not have them, ask someone to add them for you. What else can I do to help prevent falls?  Wear shoes that:  Do not have high heels.  Have rubber bottoms.  Are comfortable and fit you well.  Are closed at the toe. Do not wear sandals.  If you use a stepladder:  Make sure that it is fully opened. Do not climb a closed  stepladder.  Make sure that both sides of the stepladder are locked into place.  Ask someone to hold it for you, if possible.  Clearly mark and make sure that you can see:  Any grab bars or handrails.  First and last steps.  Where the edge of each step is.  Use tools that help you move around (mobility aids) if they are needed. These include:  Canes.  Walkers.  Scooters.  Crutches.  Turn on the lights when you go into a dark area. Replace any light bulbs as soon as they burn out.  Set up your furniture so you have a clear path. Avoid moving your furniture around.  If any of your floors are uneven, fix them.  If there are any pets around you, be aware of where they are.  Review your medicines with your doctor. Some medicines can make you feel dizzy. This can increase your chance of falling. Ask your doctor what other things that you can do to help prevent falls. This information is not intended to replace advice given to you by your health care provider. Make sure you discuss any questions you have with your health care provider. Document Released: 08/13/2009 Document Revised: 03/24/2016 Document Reviewed: 11/21/2014 Elsevier Interactive Patient Education  2017 [employer].

## 2019-11-29 DIAGNOSIS — I10 Essential (primary) hypertension: Secondary | ICD-10-CM | POA: Diagnosis not present

## 2019-11-29 DIAGNOSIS — R600 Localized edema: Secondary | ICD-10-CM | POA: Diagnosis not present

## 2019-11-29 DIAGNOSIS — E782 Mixed hyperlipidemia: Secondary | ICD-10-CM | POA: Diagnosis not present

## 2019-11-29 DIAGNOSIS — I2510 Atherosclerotic heart disease of native coronary artery without angina pectoris: Secondary | ICD-10-CM | POA: Diagnosis not present

## 2019-12-25 NOTE — Progress Notes (Signed)
Patient: Zachary Martinez Male    DOB: [birth year]   79 y.o.   MRN: [id number] Visit Date: 12/26/2019  Today's Provider: [name], MD   Chief Complaint  Patient presents with  . Follow-up  . Diabetes  . Hypertension  . Hyperlipidemia   Subjective:     HPI   Type 2 diabetes mellitus with hyperglycemia, without long-term current use of insulin ([place]) From 08/22/2019-Stable at 6.8. Follow up in 4 months.    Overall patient feeling well.  He has had his Covid shots.  He has no complaints today.  His wife is slowly getting over back surgery.  Essential hypertension From 08/22/2019-Stable continue current medications.   Other hyperlipidemia From 08/22/2019-Stable.  Obstructive sleep apnea syndrome From 08/22/2019-Patient is using CPAP machine nightly and benefiting from this.   Allergies  Allergen Reactions  . Ace Inhibitors Anaphylaxis and Other (See Comments)     Current Outpatient Medications:  .  amLODipine (NORVASC) 10 MG tablet, TAKE 1 TABLET EVERY DAY, Disp: 90 tablet, Rfl: 3 .  atorvastatin (LIPITOR) 40 MG tablet, TAKE 1 TABLET EVERY DAY, Disp: 90 tablet, Rfl: 3 .  carvedilol (COREG) 6.25 MG tablet, Take 1 tablet (6.25 mg total) by mouth 2 (two) times daily., Disp: 180 tablet, Rfl: 1 .  Cholecalciferol (VITAMIN D3) 1000 UNITS CAPS, Take 2,000 Units by mouth daily. , Disp: , Rfl:  .  EPINEPHRINE, ANAPHYLAXIS THERAPY AGENTS,, EPIPEN 2-PAK, 0.3MG/0.3ML (Injection Device) - Historical Medication  as directed (0.3 MG/0.3ML) Active, Disp: , Rfl:  .  furosemide (LASIX) 20 MG tablet, Take 1 tablet by mouth every morning., Disp: , Rfl:  .  hydrALAZINE (APRESOLINE) 100 MG tablet, Take 1 tablet by mouth 2 (two) times daily. , Disp: , Rfl:  .  INVOKAMET [phone] MG TABS, TAKE 1 TABLET TWICE DAILY, Disp: 180 tablet, Rfl: 3 .  levothyroxine (SYNTHROID, LEVOTHROID) 88 MCG tablet, TAKE 1 TABLET EVERY DAY, Disp: 90 tablet, Rfl: 3 .  pioglitazone (ACTOS) 30 MG tablet,  TAKE 1 TABLET EVERY DAY, Disp: 90 tablet, Rfl: 3 .  metFORMIN (GLUCOPHAGE) 1000 MG tablet, Take 1,000 mg by mouth daily with breakfast., Disp: , Rfl:  .  prednisoLONE acetate (PRED FORTE) 1 % ophthalmic suspension, , Disp: , Rfl:  .  valACYclovir (VALTREX) 1000 MG tablet, , Disp: , Rfl:   Review of Systems  Constitutional: Negative for appetite change, chills and fever.  HENT: Negative.   Eyes: Negative.   Respiratory: Negative for chest tightness, shortness of breath and wheezing.   Cardiovascular: Negative for chest pain and palpitations.  Gastrointestinal: Negative for abdominal pain, nausea and vomiting.  Endocrine: Negative.   Allergic/Immunologic: Negative.   Hematological: Negative.   Psychiatric/Behavioral: Negative.     Social History   Tobacco Use  . Smoking status: Former Smoker    Quit date: 07/24/1979    Years since quitting: 40.4  . Smokeless tobacco: Former Systems developer    Quit date: 11/01/1979  Substance Use Topics  . Alcohol use: Not Currently      Objective:   BP (!) 142/69 (BP Location: Left Arm, Patient Position: Sitting, Cuff Size: Large)   Pulse (!) 50   Temp (!) 96.9 F (36.1 C) (Other (Comment))   Resp 18   Ht _0  (1.753 m)   Wt 244 lb (110.7 kg)   SpO2 94%   BMI 36.03 kg/m  Vitals:   12/26/19 0814  BP: (!) 142/69  Pulse: (!) 50  Resp:  18  Temp: (!) 96.9 F (36.1 C)  TempSrc: Other (Comment)  SpO2: 94%  Weight: 244 lb (110.7 kg)  Height: _0  (1.753 m)  Body mass index is 36.03 kg/m.   Physical Exam Vitals reviewed.  Constitutional:      Appearance: He is well-developed.  HENT:     Head: Normocephalic and atraumatic.     Right Ear: External ear normal.     Left Ear: External ear normal.     Nose: Nose normal.  Eyes:     General: No scleral icterus.    Conjunctiva/sclera: Conjunctivae normal.  Neck:     Thyroid: No thyromegaly.  Cardiovascular:     Rate and Rhythm: Normal rate and regular rhythm.     Heart sounds: Normal heart  sounds.  Pulmonary:     Effort: Pulmonary effort is normal.     Breath sounds: Normal breath sounds.  Abdominal:     Palpations: Abdomen is soft.  Musculoskeletal:     Comments: 1+ bilateral edema.  Skin:    General: Skin is warm and dry.  Neurological:     Mental Status: He is alert and oriented to person, place, and time.     Comments: Foot exam is benign.  Psychiatric:        Mood and Affect: Mood normal.        Behavior: Behavior normal.        Thought Content: Thought content normal.        Judgment: Judgment normal.      No results found for any visits on 12/26/19.     Assessment & Plan    1. Type 2 diabetes mellitus with hyperglycemia, without long-term current use of insulin (HCC) Good control with an A1c of 6.8.  He has done very well with invoke a met and pioglitazone - POCT glycosylated hemoglobin (Hb A1C)  2. Essential hypertension Controlled with amlodipine and hydralazine.  Check on ARB on his next visit.  3. CAD in native artery All risk factors treated, patient on atorvastatin  4. Hypertensive left ventricular hypertrophy, without heart failure   5. Obstructive apnea On CPAP.  6. Adult hypothyroidism   7. Other hyperlipidemia On atorvastatin. 8. Class 2 severe obesity due to excess calories with serious comorbidity and body mass index (BMI) of 38.0 to 38.9 in adult [facility]) With diabetes, hypertension, heart disease, hyperlipidemia      Follow up in July for CPE.     Richard Cranford Mon, MD  [facility]

## 2019-12-26 ENCOUNTER — Other Ambulatory Visit: Payer: Self-pay

## 2019-12-26 ENCOUNTER — Ambulatory Visit (INDEPENDENT_AMBULATORY_CARE_PROVIDER_SITE_OTHER): Payer: Medicare Other | Admitting: Family Medicine

## 2019-12-26 ENCOUNTER — Encounter: Payer: Self-pay | Admitting: Family Medicine

## 2019-12-26 VITALS — BP 142/69 | HR 50 | Temp 96.9°F | Resp 18 | Ht 69.0 in | Wt 244.0 lb

## 2019-12-26 DIAGNOSIS — E1165 Type 2 diabetes mellitus with hyperglycemia: Secondary | ICD-10-CM

## 2019-12-26 DIAGNOSIS — I10 Essential (primary) hypertension: Secondary | ICD-10-CM | POA: Diagnosis not present

## 2019-12-26 DIAGNOSIS — Z6838 Body mass index (BMI) 38.0-38.9, adult: Secondary | ICD-10-CM | POA: Diagnosis not present

## 2019-12-26 DIAGNOSIS — I119 Hypertensive heart disease without heart failure: Secondary | ICD-10-CM | POA: Diagnosis not present

## 2019-12-26 DIAGNOSIS — I2510 Atherosclerotic heart disease of native coronary artery without angina pectoris: Secondary | ICD-10-CM

## 2019-12-26 DIAGNOSIS — E039 Hypothyroidism, unspecified: Secondary | ICD-10-CM

## 2019-12-26 DIAGNOSIS — E7849 Other hyperlipidemia: Secondary | ICD-10-CM

## 2019-12-26 DIAGNOSIS — G4733 Obstructive sleep apnea (adult) (pediatric): Secondary | ICD-10-CM

## 2019-12-26 LAB — POCT GLYCOSYLATED HEMOGLOBIN (HGB A1C)
Est. average glucose Bld gHb Est-mCnc: 148
Hemoglobin A1C: 6.8 % — AB (ref 4.0–5.6)

## 2020-01-20 DIAGNOSIS — D2261 Melanocytic nevi of right upper limb, including shoulder: Secondary | ICD-10-CM | POA: Diagnosis not present

## 2020-01-20 DIAGNOSIS — D485 Neoplasm of uncertain behavior of skin: Secondary | ICD-10-CM | POA: Diagnosis not present

## 2020-01-20 DIAGNOSIS — L821 Other seborrheic keratosis: Secondary | ICD-10-CM | POA: Diagnosis not present

## 2020-01-20 DIAGNOSIS — X32XXXA Exposure to sunlight, initial encounter: Secondary | ICD-10-CM | POA: Diagnosis not present

## 2020-01-20 DIAGNOSIS — D0422 Carcinoma in situ of skin of left ear and external auricular canal: Secondary | ICD-10-CM | POA: Diagnosis not present

## 2020-01-20 DIAGNOSIS — Z85828 Personal history of other malignant neoplasm of skin: Secondary | ICD-10-CM | POA: Diagnosis not present

## 2020-01-20 DIAGNOSIS — D2262 Melanocytic nevi of left upper limb, including shoulder: Secondary | ICD-10-CM | POA: Diagnosis not present

## 2020-01-20 DIAGNOSIS — L570 Actinic keratosis: Secondary | ICD-10-CM | POA: Diagnosis not present

## 2020-02-17 ENCOUNTER — Other Ambulatory Visit: Payer: Self-pay | Admitting: Family Medicine

## 2020-02-17 DIAGNOSIS — I10 Essential (primary) hypertension: Secondary | ICD-10-CM

## 2020-02-17 DIAGNOSIS — D0422 Carcinoma in situ of skin of left ear and external auricular canal: Secondary | ICD-10-CM | POA: Diagnosis not present

## 2020-02-17 MED ORDER — AMLODIPINE BESYLATE 10 MG PO TABS: 10.0000 mg | ORAL_TABLET | Freq: Every day | ORAL | 3 refills | Status: DC

## 2020-02-17 MED ORDER — CARVEDILOL 6.25 MG PO TABS: 6.2500 mg | ORAL_TABLET | Freq: Two times a day (BID) | ORAL | 1 refills | Status: DC

## 2020-02-17 NOTE — Telephone Encounter (Signed)
[facility] faxed refill request for the following medications:  amLODipine (NORVASC) 10 MG tablet carvedilol (COREG) 6.25 MG tablet  Please advise.

## 2020-03-04 DIAGNOSIS — D2371 Other benign neoplasm of skin of right lower limb, including hip: Secondary | ICD-10-CM | POA: Diagnosis not present

## 2020-03-04 DIAGNOSIS — M79671 Pain in right foot: Secondary | ICD-10-CM | POA: Diagnosis not present

## 2020-03-23 ENCOUNTER — Other Ambulatory Visit: Payer: Self-pay | Admitting: Family Medicine

## 2020-03-25 DIAGNOSIS — M79671 Pain in right foot: Secondary | ICD-10-CM | POA: Diagnosis not present

## 2020-03-25 DIAGNOSIS — D2371 Other benign neoplasm of skin of right lower limb, including hip: Secondary | ICD-10-CM | POA: Diagnosis not present

## 2020-04-05 DIAGNOSIS — D1779 Benign lipomatous neoplasm of other sites: Secondary | ICD-10-CM

## 2020-04-05 HISTORY — DX: Benign lipomatous neoplasm of other sites: D17.79

## 2020-05-18 DIAGNOSIS — I2510 Atherosclerotic heart disease of native coronary artery without angina pectoris: Secondary | ICD-10-CM | POA: Diagnosis not present

## 2020-05-18 DIAGNOSIS — I10 Essential (primary) hypertension: Secondary | ICD-10-CM | POA: Diagnosis not present

## 2020-05-18 DIAGNOSIS — I119 Hypertensive heart disease without heart failure: Secondary | ICD-10-CM | POA: Diagnosis not present

## 2020-05-18 DIAGNOSIS — I340 Nonrheumatic mitral (valve) insufficiency: Secondary | ICD-10-CM | POA: Diagnosis not present

## 2020-05-18 DIAGNOSIS — R0600 Dyspnea, unspecified: Secondary | ICD-10-CM | POA: Diagnosis not present

## 2020-05-18 DIAGNOSIS — G4733 Obstructive sleep apnea (adult) (pediatric): Secondary | ICD-10-CM | POA: Diagnosis not present

## 2020-05-18 DIAGNOSIS — R0602 Shortness of breath: Secondary | ICD-10-CM | POA: Diagnosis not present

## 2020-05-18 DIAGNOSIS — E782 Mixed hyperlipidemia: Secondary | ICD-10-CM | POA: Diagnosis not present

## 2020-05-18 DIAGNOSIS — I872 Venous insufficiency (chronic) (peripheral): Secondary | ICD-10-CM | POA: Diagnosis not present

## 2020-05-18 NOTE — Progress Notes (Signed)
Established patient visit  I,[name],acting as a scribe for [name], MD.,have documented all relevant documentation on the behalf of [name], MD,as directed by  [name], MD while in the presence of [name], MD.   Patient: Zachary Martinez   DOB: 12-Apr-1941   79 y.o. Male  MRN: [id number] Visit Date: 05/20/2020  Today's healthcare provider: [name], MD   Chief Complaint  Patient presents with  . Diabetes  . Follow-up  . Hyperlipidemia  . Hypertension   Subjective    HPI  Patient had AWV with [facility] 11/05/2019.  Diabetes Mellitus Type II, follow-up  Lab Results  Component Value Date   HGBA1C 6.8 (A) 12/26/2019   HGBA1C 6.8 (A) 08/22/2019   HGBA1C 7.1 (A) 04/22/2019   Last seen for diabetes 5 months ago.  Management since then includes; Good control with an A1c of 6.8.  He has done very well with invoke a met and pioglitazone. He reports good compliance with treatment. He is not having side effects. none  Home blood sugar records: fasting range: not checking  Episodes of hypoglycemia? No n/a   Current insulin regiment: n/a Most Recent Eye Exam: 09/09/2019  --------------------------------------------------------------------  Hypertension, follow-up  BP Readings from Last 3 Encounters:  05/20/20 138/66  12/26/19 (!) 142/69  08/29/19 134/64   Wt Readings from Last 3 Encounters:  05/20/20 244 lb (110.7 kg)  12/26/19 244 lb (110.7 kg)  08/29/19 241 lb (109.3 kg)     He was last seen for hypertension 5 months ago.  BP at that visit was 142/69. Management since that visit includes; Controlled with amlodipine and hydralazine.  Check on ARB on his next visit. He reports good compliance with treatment. He is not having side effects. none He is not exercising. He is not adherent to low salt diet.   Outside blood pressures are not checking.  He does not smoke.  Use of agents associated with hypertension:  none.   --------------------------------------------------------------------  Lipid/Cholesterol, follow-up  Last Lipid Panel: Lab Results  Component Value Date   CHOL 113 04/22/2019   LDLCALC 45 04/22/2019   HDL 32 (L) 04/22/2019   TRIG 181 (H) 04/22/2019    He was last seen for this 5 months ago.  Management since that visit includes; On atorvastatin. He reports good compliance with treatment. He is not having side effects. none He is following a Regular diet. Current exercise: yard work  Last Sports coach Lab Results  Component Value Date   GLUCOSE 155 (H) 04/22/2019   NA 142 04/22/2019   K 4.5 04/22/2019   BUN 16 04/22/2019   CREATININE 0.89 04/22/2019   GFRNONAA 83 04/22/2019   GFRAA 96 04/22/2019   CALCIUM 10.9 (H) 04/22/2019   AST 15 04/22/2019   ALT 13 04/22/2019   The ASCVD Risk score Mikey Bussing DC Jr., et al., 2013) failed to calculate for the following reasons:   The valid total cholesterol range is 130 to 320 mg/dL  --------------------------------------------------------------------       Medications: Outpatient Medications Prior to Visit  Medication Sig  . amLODipine (NORVASC) 10 MG tablet Take 1 tablet (10 mg total) by mouth daily.  Marland Kitchen atorvastatin (LIPITOR) 40 MG tablet TAKE 1 TABLET EVERY DAY  . carvedilol (COREG) 6.25 MG tablet Take 1 tablet (6.25 mg total) by mouth 2 (two) times daily.  . Cholecalciferol (VITAMIN D3) 1000 UNITS CAPS Take 2,000 Units by mouth daily.   Marland Kitchen EPINEPHRINE,  ANAPHYLAXIS THERAPY AGENTS, EPIPEN 2-PAK, 0.3MG/0.3ML (Injection Device) - Historical Medication  as directed (0.3 MG/0.3ML) Active  . furosemide (LASIX) 20 MG tablet Take 1 tablet by mouth every morning.  . hydrALAZINE (APRESOLINE) 100 MG tablet Take 1 tablet by mouth 2 (two) times daily.   . INVOKAMET [phone] MG TABS TAKE 1 TABLET TWICE DAILY  . levothyroxine (SYNTHROID) 88 MCG tablet TAKE 1 TABLET EVERY DAY  . pioglitazone (ACTOS) 30 MG tablet TAKE 1 TABLET  EVERY DAY  . metFORMIN (GLUCOPHAGE) 1000 MG tablet Take 1,000 mg by mouth daily with breakfast. (Patient not taking: Reported on 05/20/2020)  . prednisoLONE acetate (PRED FORTE) 1 % ophthalmic suspension  (Patient not taking: Reported on 05/20/2020)  . valACYclovir (VALTREX) 1000 MG tablet  (Patient not taking: Reported on 05/20/2020)   No facility-administered medications prior to visit.    Review of Systems  All other systems reviewed and are negative.      Objective    BP 138/66 (BP Location: Left Arm, Patient Position: Sitting, Cuff Size: Large)   Pulse (!) 53   Temp (!) 97.1 F (36.2 C) (Other (Comment))   Resp 18   Ht 5' 9" (1.753 m)   Wt 244 lb (110.7 kg)   SpO2 97%   BMI 36.03 kg/m  BP Readings from Last 3 Encounters:  05/20/20 138/66  12/26/19 (!) 142/69  08/29/19 134/64   Wt Readings from Last 3 Encounters:  05/20/20 244 lb (110.7 kg)  12/26/19 244 lb (110.7 kg)  08/29/19 241 lb (109.3 kg)      Physical Exam Vitals reviewed.  Constitutional:      Appearance: He is well-developed.  HENT:     Head: Normocephalic and atraumatic.     Right Ear: External ear normal.     Left Ear: External ear normal.     Nose: Nose normal.  Eyes:     General: No scleral icterus.    Conjunctiva/sclera: Conjunctivae normal.  Neck:     Thyroid: No thyromegaly.  Cardiovascular:     Rate and Rhythm: Normal rate and regular rhythm.     Heart sounds: Normal heart sounds.  Pulmonary:     Effort: Pulmonary effort is normal.     Breath sounds: Normal breath sounds.  Abdominal:     Palpations: Abdomen is soft.  Skin:    General: Skin is warm and dry.  Neurological:     General: No focal deficit present.     Mental Status: He is alert and oriented to person, place, and time.  Psychiatric:        Mood and Affect: Mood normal.        Behavior: Behavior normal.        Thought Content: Thought content normal.        Judgment: Judgment normal.     BP 138/66 (BP Location: Left  Arm, Patient Position: Sitting, Cuff Size: Large)   Pulse (!) 53   Temp (!) 97.1 F (36.2 C) (Other (Comment))   Resp 18   Ht 5' 9" (1.753 m)   Wt 244 lb (110.7 kg)   SpO2 97%   BMI 36.03 kg/m   General Appearance:    Alert, cooperative, no distress, appears stated age  Head:    Normocephalic, without obvious abnormality, atraumatic  Eyes:    PERRL, conjunctiva/corneas clear, EOM's intact, fundi    benign, both eyes       Ears:    Normal TM's and external ear canals, both ears  Nose:   Nares normal, septum midline, mucosa normal, no drainage   or sinus tenderness  Throat:   Lips, mucosa, and tongue normal; teeth and gums normal  Neck:   Supple, symmetrical, trachea midline, no adenopathy;       thyroid:  No enlargement/tenderness/nodules; no carotid   bruit or JVD  Back:     Symmetric, no curvature, ROM normal, no CVA tenderness  Lungs:     Clear to auscultation bilaterally, respirations unlabored  Chest wall:    No tenderness or deformity  Heart:    Regular rate and rhythm, S1 and S2 normal, no murmur, rub   or gallop  Abdomen:     Soft, non-tender, bowel sounds active all four quadrants,    no masses, no organomegaly  Genitalia:    Normal male without lesion, discharge or tenderness  Rectal:    Normal tone, normal prostate, no masses or tenderness;   guaiac negative stool  Extremities:   Extremities normal, atraumatic, no cyanosis or edema  Pulses:   2+ and symmetric all extremities  Skin:   Skin color, texture, turgor normal, no rashes or lesions  Lymph nodes:   Cervical, supraclavicular, and axillary nodes normal  Neurologic:   CNII-XII intact. Normal strength, sensation and reflexes      throughout     No results found for any visits on 05/20/20.  Assessment & Plan     1. Type 2 diabetes mellitus with hyperglycemia, without long-term current use of insulin (HCC) On Metformin and pioglitazone. - POCT UA - Microalbumin - Hemoglobin A1c - TSH - Comprehensive  Metabolic Panel (CMET) - CBC w/Diff/Platelet - Lipid panel  2. Diabetic polyneuropathy associated with type 2 diabetes mellitus (HCC)  - pioglitazone (ACTOS) 30 MG tablet; Take 1 tablet (30 mg total) by mouth daily.  Dispense: 90 tablet; Refill: 3 - Hemoglobin A1c - TSH - Comprehensive Metabolic Panel (CMET) - CBC w/Diff/Platelet - Lipid panel  3. Essential hypertension On amlodipine Coreg and hydralazine - hydrALAZINE (APRESOLINE) 100 MG tablet; Take 1 tablet (100 mg total) by mouth 2 (two) times daily.  Dispense: 180 tablet; Refill: 3 - Hemoglobin A1c - TSH - Comprehensive Metabolic Panel (CMET) - CBC w/Diff/Platelet - Lipid panel  4. CAD in native artery All risk factors treated. - Hemoglobin A1c - TSH - Comprehensive Metabolic Panel (CMET) - CBC w/Diff/Platelet - Lipid panel  5. Adult hypothyroidism  - Hemoglobin A1c - TSH - Comprehensive Metabolic Panel (CMET) - CBC w/Diff/Platelet - Lipid panel  6. Other hyperlipidemia On atorvastatin - Hemoglobin A1c - TSH - Comprehensive Metabolic Panel (CMET) - CBC w/Diff/Platelet - Lipid panel   No follow-ups on file.         Deandre Brannan Cranford Mon, MD  [facility] [phone] (phone) [phone] (fax)  [place]

## 2020-05-20 ENCOUNTER — Ambulatory Visit (INDEPENDENT_AMBULATORY_CARE_PROVIDER_SITE_OTHER): Payer: Medicare Other | Admitting: Family Medicine

## 2020-05-20 ENCOUNTER — Other Ambulatory Visit: Payer: Self-pay

## 2020-05-20 ENCOUNTER — Encounter: Payer: Self-pay | Admitting: Family Medicine

## 2020-05-20 VITALS — BP 138/66 | HR 53 | Temp 97.1°F | Resp 18 | Ht 69.0 in | Wt 244.0 lb

## 2020-05-20 DIAGNOSIS — E7849 Other hyperlipidemia: Secondary | ICD-10-CM | POA: Diagnosis not present

## 2020-05-20 DIAGNOSIS — E039 Hypothyroidism, unspecified: Secondary | ICD-10-CM | POA: Diagnosis not present

## 2020-05-20 DIAGNOSIS — E1165 Type 2 diabetes mellitus with hyperglycemia: Secondary | ICD-10-CM

## 2020-05-20 DIAGNOSIS — I2510 Atherosclerotic heart disease of native coronary artery without angina pectoris: Secondary | ICD-10-CM | POA: Diagnosis not present

## 2020-05-20 DIAGNOSIS — I10 Essential (primary) hypertension: Secondary | ICD-10-CM | POA: Diagnosis not present

## 2020-05-20 DIAGNOSIS — E1142 Type 2 diabetes mellitus with diabetic polyneuropathy: Secondary | ICD-10-CM

## 2020-05-20 LAB — POCT UA - MICROALBUMIN: Microalbumin Ur, POC: NEGATIVE mg/L

## 2020-05-20 MED ORDER — PIOGLITAZONE HCL 30 MG PO TABS: 30.0000 mg | ORAL_TABLET | Freq: Every day | ORAL | 3 refills | Status: DC

## 2020-05-20 MED ORDER — HYDRALAZINE HCL 100 MG PO TABS: 100.0000 mg | ORAL_TABLET | Freq: Two times a day (BID) | ORAL | 3 refills | Status: DC

## 2020-05-21 ENCOUNTER — Other Ambulatory Visit: Payer: Self-pay | Admitting: Family Medicine

## 2020-05-21 DIAGNOSIS — E039 Hypothyroidism, unspecified: Secondary | ICD-10-CM | POA: Diagnosis not present

## 2020-05-21 DIAGNOSIS — E1165 Type 2 diabetes mellitus with hyperglycemia: Secondary | ICD-10-CM | POA: Diagnosis not present

## 2020-05-21 DIAGNOSIS — E1142 Type 2 diabetes mellitus with diabetic polyneuropathy: Secondary | ICD-10-CM | POA: Diagnosis not present

## 2020-05-21 DIAGNOSIS — I10 Essential (primary) hypertension: Secondary | ICD-10-CM | POA: Diagnosis not present

## 2020-05-21 DIAGNOSIS — I2510 Atherosclerotic heart disease of native coronary artery without angina pectoris: Secondary | ICD-10-CM | POA: Diagnosis not present

## 2020-05-21 DIAGNOSIS — E7849 Other hyperlipidemia: Secondary | ICD-10-CM | POA: Diagnosis not present

## 2020-05-22 ENCOUNTER — Telehealth: Payer: Self-pay

## 2020-05-22 LAB — CBC WITH DIFFERENTIAL/PLATELET
Basophils Absolute: 0 10*3/uL (ref 0.0–0.2)
Basos: 1 %
EOS (ABSOLUTE): 0.1 10*3/uL (ref 0.0–0.4)
Eos: 2 %
Hematocrit: 43.3 % (ref 37.5–51.0)
Hemoglobin: 14.3 g/dL (ref 13.0–17.7)
Immature Grans (Abs): 0 10*3/uL (ref 0.0–0.1)
Immature Granulocytes: 0 %
Lymphocytes Absolute: 1 10*3/uL (ref 0.7–3.1)
Lymphs: 20 %
MCH: 31.2 pg (ref 26.6–33.0)
MCHC: 33 g/dL (ref 31.5–35.7)
MCV: 94 fL (ref 79–97)
Monocytes Absolute: 0.4 10*3/uL (ref 0.1–0.9)
Monocytes: 8 %
Neutrophils Absolute: 3.3 10*3/uL (ref 1.4–7.0)
Neutrophils: 69 %
Platelets: 169 10*3/uL (ref 150–450)
RBC: 4.59 x10E6/uL (ref 4.14–5.80)
RDW: 14.3 % (ref 11.6–15.4)
WBC: 4.7 10*3/uL (ref 3.4–10.8)

## 2020-05-22 LAB — LIPID PANEL
Chol/HDL Ratio: 4.1 ratio (ref 0.0–5.0)
Cholesterol, Total: 119 mg/dL (ref 100–199)
HDL: 29 mg/dL — ABNORMAL LOW (ref >39)
LDL Chol Calc (NIH): 52 mg/dL (ref 0–99)
Triglycerides: 234 mg/dL — ABNORMAL HIGH (ref 0–149)
VLDL Cholesterol Cal: 38 mg/dL (ref 5–40)

## 2020-05-22 LAB — COMPREHENSIVE METABOLIC PANEL
ALT: 17 IU/L (ref 0–44)
AST: 15 IU/L (ref 0–40)
Albumin/Globulin Ratio: 2 (ref 1.2–2.2)
Albumin: 4.2 g/dL (ref 3.7–4.7)
Alkaline Phosphatase: 94 IU/L (ref 48–121)
BUN/Creatinine Ratio: 27 — ABNORMAL HIGH (ref 10–24)
BUN: 26 mg/dL (ref 8–27)
Bilirubin Total: 0.4 mg/dL (ref 0.0–1.2)
CO2: 23 mmol/L (ref 20–29)
Calcium: 10.7 mg/dL — ABNORMAL HIGH (ref 8.6–10.2)
Chloride: 107 mmol/L — ABNORMAL HIGH (ref 96–106)
Creatinine, Ser: 0.96 mg/dL (ref 0.76–1.27)
GFR calc Af Amer: 88 mL/min/{1.73_m2} (ref >59)
GFR calc non Af Amer: 76 mL/min/{1.73_m2} (ref >59)
Globulin, Total: 2.1 g/dL (ref 1.5–4.5)
Glucose: 165 mg/dL — ABNORMAL HIGH (ref 65–99)
Potassium: 4.5 mmol/L (ref 3.5–5.2)
Sodium: 141 mmol/L (ref 134–144)
Total Protein: 6.3 g/dL (ref 6.0–8.5)

## 2020-05-22 LAB — HEMOGLOBIN A1C
Est. average glucose Bld gHb Est-mCnc: 148 mg/dL
Hgb A1c MFr Bld: 6.8 % — ABNORMAL HIGH (ref 4.8–5.6)

## 2020-05-22 LAB — TSH: TSH: 3.1 u[IU]/mL (ref 0.450–4.500)

## 2020-05-22 NOTE — Telephone Encounter (Signed)
Patient advised of lab results

## 2020-05-22 NOTE — Telephone Encounter (Signed)
-----   Message from Jerrol Banana., MD sent at 05/22/2020 12:45 PM EDT ----- Labs all stable.

## 2020-06-23 DIAGNOSIS — Z85828 Personal history of other malignant neoplasm of skin: Secondary | ICD-10-CM | POA: Diagnosis not present

## 2020-06-23 DIAGNOSIS — X32XXXA Exposure to sunlight, initial encounter: Secondary | ICD-10-CM | POA: Diagnosis not present

## 2020-06-23 DIAGNOSIS — D2261 Melanocytic nevi of right upper limb, including shoulder: Secondary | ICD-10-CM | POA: Diagnosis not present

## 2020-06-23 DIAGNOSIS — D225 Melanocytic nevi of trunk: Secondary | ICD-10-CM | POA: Diagnosis not present

## 2020-06-23 DIAGNOSIS — L570 Actinic keratosis: Secondary | ICD-10-CM | POA: Diagnosis not present

## 2020-06-23 DIAGNOSIS — L821 Other seborrheic keratosis: Secondary | ICD-10-CM | POA: Diagnosis not present

## 2020-06-23 DIAGNOSIS — D2262 Melanocytic nevi of left upper limb, including shoulder: Secondary | ICD-10-CM | POA: Diagnosis not present

## 2020-06-29 DIAGNOSIS — R0600 Dyspnea, unspecified: Secondary | ICD-10-CM | POA: Diagnosis not present

## 2020-06-29 DIAGNOSIS — I340 Nonrheumatic mitral (valve) insufficiency: Secondary | ICD-10-CM | POA: Diagnosis not present

## 2020-06-29 DIAGNOSIS — I2510 Atherosclerotic heart disease of native coronary artery without angina pectoris: Secondary | ICD-10-CM | POA: Diagnosis not present

## 2020-06-29 DIAGNOSIS — I6523 Occlusion and stenosis of bilateral carotid arteries: Secondary | ICD-10-CM | POA: Diagnosis not present

## 2020-06-29 DIAGNOSIS — R0602 Shortness of breath: Secondary | ICD-10-CM | POA: Diagnosis not present

## 2020-06-30 ENCOUNTER — Other Ambulatory Visit: Payer: Self-pay | Admitting: Family Medicine

## 2020-06-30 DIAGNOSIS — E039 Hypothyroidism, unspecified: Secondary | ICD-10-CM

## 2020-06-30 MED ORDER — LEVOTHYROXINE SODIUM 88 MCG PO TABS: 88.0000 ug | ORAL_TABLET | Freq: Every day | ORAL | 2 refills | Status: DC

## 2020-06-30 NOTE — Telephone Encounter (Signed)
Refill sent to [facility] for patient.

## 2020-06-30 NOTE — Telephone Encounter (Signed)
Patient needs refills on Levothyroxine 88 mcg.  He has 7 days worth left. He gets 90 days worth w/ 3 RFs.    Please send to [place]

## 2020-07-14 DIAGNOSIS — I071 Rheumatic tricuspid insufficiency: Secondary | ICD-10-CM | POA: Diagnosis not present

## 2020-07-14 DIAGNOSIS — Z23 Encounter for immunization: Secondary | ICD-10-CM | POA: Diagnosis not present

## 2020-07-14 DIAGNOSIS — G4733 Obstructive sleep apnea (adult) (pediatric): Secondary | ICD-10-CM | POA: Diagnosis not present

## 2020-07-14 DIAGNOSIS — I10 Essential (primary) hypertension: Secondary | ICD-10-CM | POA: Diagnosis not present

## 2020-07-14 DIAGNOSIS — E782 Mixed hyperlipidemia: Secondary | ICD-10-CM | POA: Diagnosis not present

## 2020-07-14 DIAGNOSIS — I2510 Atherosclerotic heart disease of native coronary artery without angina pectoris: Secondary | ICD-10-CM | POA: Diagnosis not present

## 2020-07-14 DIAGNOSIS — I779 Disorder of arteries and arterioles, unspecified: Secondary | ICD-10-CM | POA: Diagnosis not present

## 2020-07-14 DIAGNOSIS — I340 Nonrheumatic mitral (valve) insufficiency: Secondary | ICD-10-CM | POA: Diagnosis not present

## 2020-07-14 DIAGNOSIS — R0600 Dyspnea, unspecified: Secondary | ICD-10-CM | POA: Diagnosis not present

## 2020-07-14 DIAGNOSIS — I872 Venous insufficiency (chronic) (peripheral): Secondary | ICD-10-CM | POA: Diagnosis not present

## 2020-07-22 ENCOUNTER — Other Ambulatory Visit: Payer: Self-pay

## 2020-07-22 DIAGNOSIS — E1165 Type 2 diabetes mellitus with hyperglycemia: Secondary | ICD-10-CM

## 2020-07-22 MED ORDER — INVOKAMET 150-1000 MG PO TABS: 1.0000 | ORAL_TABLET | Freq: Two times a day (BID) | ORAL | 3 refills | Status: DC

## 2020-07-22 NOTE — Telephone Encounter (Signed)
Medication refill sent to [facility].

## 2020-07-22 NOTE — Telephone Encounter (Signed)
Patient needs Invokamet [phone] sent to [facility]

## 2020-08-27 ENCOUNTER — Other Ambulatory Visit: Payer: Self-pay

## 2020-08-27 DIAGNOSIS — H16002 Unspecified corneal ulcer, left eye: Secondary | ICD-10-CM | POA: Diagnosis not present

## 2020-08-27 DIAGNOSIS — I10 Essential (primary) hypertension: Secondary | ICD-10-CM

## 2020-08-27 MED ORDER — CARVEDILOL 6.25 MG PO TABS: 6.2500 mg | ORAL_TABLET | Freq: Two times a day (BID) | ORAL | 1 refills | Status: DC

## 2020-08-27 NOTE — Telephone Encounter (Signed)
Patient is asking for refills to be sent to [facility] of Carvedilol 6.25 mg. Patient is totally out.

## 2020-08-31 DIAGNOSIS — H16002 Unspecified corneal ulcer, left eye: Secondary | ICD-10-CM | POA: Diagnosis not present

## 2020-09-02 DIAGNOSIS — H16002 Unspecified corneal ulcer, left eye: Secondary | ICD-10-CM | POA: Diagnosis not present

## 2020-09-11 DIAGNOSIS — B0052 Herpesviral keratitis: Secondary | ICD-10-CM | POA: Diagnosis not present

## 2020-09-14 DIAGNOSIS — M1711 Unilateral primary osteoarthritis, right knee: Secondary | ICD-10-CM | POA: Diagnosis not present

## 2020-09-14 DIAGNOSIS — E1142 Type 2 diabetes mellitus with diabetic polyneuropathy: Secondary | ICD-10-CM | POA: Diagnosis not present

## 2020-09-17 DIAGNOSIS — B0052 Herpesviral keratitis: Secondary | ICD-10-CM | POA: Diagnosis not present

## 2020-09-23 ENCOUNTER — Ambulatory Visit: Payer: Self-pay | Admitting: Family Medicine

## 2020-10-06 NOTE — Progress Notes (Signed)
Established patient visit   Patient: Zachary Martinez   DOB: Nov 29, 1940   79 y.o. Male  MRN: [id number] Visit Date: 10/07/2020  Today's healthcare provider: [name], MD   Chief Complaint  Patient presents with  . Diabetes  . Hypertension   Subjective    HPI  Diabetes Mellitus Type II, follow-up  Lab Results  Component Value Date   HGBA1C 6.9 (A) 10/07/2020   HGBA1C 6.8 (H) 05/21/2020   HGBA1C 6.8 (A) 12/26/2019   Last seen for diabetes 5 months ago.  Management since then includes continuing the same treatment. He reports excellent compliance with treatment. He is not having side effects.   Home blood sugar records: not being checked  Episodes of hypoglycemia? No    Current insulin regiment: none Most Recent Eye Exam: 09/09/2019 [facility]  --------------------------------------------------------------------------------------------------- Hypertension, follow-up  BP Readings from Last 3 Encounters:  10/07/20 133/63  05/20/20 138/66  12/26/19 (!) 142/69   Wt Readings from Last 3 Encounters:  10/07/20 253 lb (114.8 kg)  05/20/20 244 lb (110.7 kg)  12/26/19 244 lb (110.7 kg)     He was last seen for hypertension 5 months ago.  BP at that visit was 138/66. Management since that visit includes; On amlodipine Coreg and hydralazine. He reports excellent compliance with treatment. He is not having side effects.  He is not exercising. He is not adherent to low salt diet.   Outside blood pressures are not being checked.  He does not smoke.  Use of agents associated with hypertension: none.   Patient complaining of urinary leaking in recent months.  This is a moderate problem. ---------------------------------------------------------------------------------------------------   Patient Active Problem List   Diagnosis Date Noted  . Bradycardia 10/30/2018  . Venous insufficiency of both lower extremities 06/02/2017  . Hypertension  06/07/2016  . Type 2 diabetes mellitus ([place]) 10/08/2015  . Abnormal chest x-ray 05/09/2015  . CAD in native artery 05/09/2015  . Benign fibroma of prostate 05/09/2015  . Arteriosclerosis of coronary artery 05/09/2015  . Diabetes mellitus with polyneuropathy ([place]) 05/09/2015  . Diverticulosis of colon 05/09/2015  . ED (erectile dysfunction) of organic origin 05/09/2015  . Family history of colon cancer 05/09/2015  . HLD (hyperlipidemia) 05/09/2015  . Adult hypothyroidism 05/09/2015  . Adiposity 05/09/2015  . Arthritis, degenerative 05/09/2015  . Parathyroid adenoma 05/09/2015  . Apnea, sleep 05/09/2015  . Disorder of bursae and tendons in shoulder region 12/22/2014  . Hypertensive left ventricular hypertrophy 11/24/2014  . MI (mitral incompetence) 11/24/2014  . Obstructive apnea 11/24/2014   Social History   Tobacco Use  . Smoking status: Former Smoker    Quit date: 07/24/1979    Years since quitting: 41.2  . Smokeless tobacco: Former Systems developer    Quit date: 11/01/1979  Vaping Use  . Vaping Use: Never used  Substance Use Topics  . Alcohol use: Not Currently  . Drug use: No   Allergies  Allergen Reactions  . Ace Inhibitors Anaphylaxis and Other (See Comments)       Medications: Outpatient Medications Prior to Visit  Medication Sig  . amLODipine (NORVASC) 10 MG tablet Take 1 tablet (10 mg total) by mouth daily.  Marland Kitchen atorvastatin (LIPITOR) 40 MG tablet TAKE 1 TABLET EVERY DAY  . Canagliflozin-metFORMIN HCl (INVOKAMET) [phone] MG TABS Take 1 tablet by mouth 2 (two) times daily.  . carvedilol (COREG) 6.25 MG tablet Take 1 tablet (6.25 mg total) by mouth 2 (two) times daily.  . Cholecalciferol (  VITAMIN D3) 1000 UNITS CAPS Take 2,000 Units by mouth daily.   Marland Kitchen EPINEPHRINE, ANAPHYLAXIS THERAPY AGENTS, EPIPEN 2-PAK, 0.3MG/0.3ML (Injection Device) - Historical Medication  as directed (0.3 MG/0.3ML) Active  . furosemide (LASIX) 20 MG tablet Take 1 tablet by mouth every morning.  .  hydrALAZINE (APRESOLINE) 100 MG tablet Take 1 tablet (100 mg total) by mouth 2 (two) times daily.  Marland Kitchen levothyroxine (SYNTHROID) 88 MCG tablet Take 1 tablet (88 mcg total) by mouth daily.  . pioglitazone (ACTOS) 30 MG tablet Take 1 tablet (30 mg total) by mouth daily.  . prednisoLONE acetate (PRED FORTE) 1 % ophthalmic suspension Place 1 drop into the left eye 3 (three) times daily.  . valACYclovir (VALTREX) 500 MG tablet Take 500 mg by mouth daily.  . [DISCONTINUED] metFORMIN (GLUCOPHAGE) 1000 MG tablet Take 1,000 mg by mouth daily with breakfast. (Patient not taking: Reported on 05/20/2020)  . [DISCONTINUED] prednisoLONE acetate (PRED FORTE) 1 % ophthalmic suspension  (Patient not taking: Reported on 05/20/2020)  . [DISCONTINUED] valACYclovir (VALTREX) 1000 MG tablet  (Patient not taking: Reported on 05/20/2020)   No facility-administered medications prior to visit.    Review of Systems  Constitutional: Negative for appetite change, chills and fever.  Respiratory: Negative for chest tightness, shortness of breath and wheezing.   Cardiovascular: Negative for chest pain and palpitations.  Gastrointestinal: Negative for abdominal pain, nausea and vomiting.    Last CBC Lab Results  Component Value Date   WBC 4.7 05/21/2020   HGB 14.3 05/21/2020   HCT 43.3 05/21/2020   MCV 94 05/21/2020   MCH 31.2 05/21/2020   RDW 14.3 05/21/2020   PLT 169 05/21/2020   Last thyroid functions Lab Results  Component Value Date   TSH 3.100 05/21/2020   Last vitamin D No results found for: 25OHVITD2, 25OHVITD3, VD25OH Last vitamin B12 and Folate No results found for: VITAMINB12, FOLATE    Objective    BP 133/63 (BP Location: Right Arm, Patient Position: Sitting, Cuff Size: Large)   Pulse (!) 54   Temp 98.3 F (36.8 C) (Oral)   Resp 16   Ht 5' 9"  (1.753 m)   Wt 253 lb (114.8 kg)   SpO2 97%   BMI 37.36 kg/m  BP Readings from Last 3 Encounters:  10/07/20 133/63  05/20/20 138/66  12/26/19 (!)  142/69   Wt Readings from Last 3 Encounters:  10/07/20 253 lb (114.8 kg)  05/20/20 244 lb (110.7 kg)  12/26/19 244 lb (110.7 kg)      Physical Exam Vitals reviewed.  Constitutional:      Appearance: He is well-developed.  HENT:     Head: Normocephalic and atraumatic.     Right Ear: External ear normal.     Left Ear: External ear normal.     Nose: Nose normal.  Eyes:     General: No scleral icterus.    Conjunctiva/sclera: Conjunctivae normal.  Neck:     Thyroid: No thyromegaly.  Cardiovascular:     Rate and Rhythm: Normal rate and regular rhythm.     Heart sounds: Normal heart sounds.  Pulmonary:     Effort: Pulmonary effort is normal.     Breath sounds: Normal breath sounds.  Abdominal:     Palpations: Abdomen is soft.  Musculoskeletal:     Right lower leg: Edema present.     Left lower leg: Edema present.  Skin:    General: Skin is warm and dry.  Neurological:     General: No focal  deficit present.     Mental Status: He is alert and oriented to person, place, and time.  Psychiatric:        Mood and Affect: Mood normal.        Behavior: Behavior normal.        Thought Content: Thought content normal.        Judgment: Judgment normal.       Results for orders placed or performed in visit on 10/07/20  POCT glycosylated hemoglobin (Hb A1C)  Result Value Ref Range   Hemoglobin A1C 6.9 (A) 4.0 - 5.6 %   Est. average glucose Bld gHb Est-mCnc 151     Assessment & Plan     1. Diabetic polyneuropathy associated with type 2 diabetes mellitus ([place]) Diabetic control has been good since changing to invoke a met. - POCT glycosylated hemoglobin (Hb A1C)  2. Primary hypertension Not furosemide and see if the leaking of urine stops.  I think it is mainly due to [place]. Patient would rather stay with Invokana.  3. CAD in native artery All risk factors treated.  4. Obstructive apnea Patient wears CPAP  5. Other hyperlipidemia Atorvastatin  6. Adult  hypothyroidism   7. Class 2 severe obesity due to excess calories with serious comorbidity and body mass index (BMI) of 38.0 to 38.9 in adult [facility]) Diet and exercise recommended.   No follow-ups on file.         Jnai Snellgrove Cranford Mon, MD  [facility] [phone] (phone) [phone] (fax)  [place]

## 2020-10-07 ENCOUNTER — Encounter: Payer: Self-pay | Admitting: Family Medicine

## 2020-10-07 ENCOUNTER — Other Ambulatory Visit: Payer: Self-pay

## 2020-10-07 ENCOUNTER — Ambulatory Visit (INDEPENDENT_AMBULATORY_CARE_PROVIDER_SITE_OTHER): Payer: Medicare Other | Admitting: Family Medicine

## 2020-10-07 VITALS — BP 133/63 | HR 54 | Temp 98.3°F | Resp 16 | Ht 69.0 in | Wt 253.0 lb

## 2020-10-07 DIAGNOSIS — G4733 Obstructive sleep apnea (adult) (pediatric): Secondary | ICD-10-CM | POA: Diagnosis not present

## 2020-10-07 DIAGNOSIS — E1142 Type 2 diabetes mellitus with diabetic polyneuropathy: Secondary | ICD-10-CM

## 2020-10-07 DIAGNOSIS — E039 Hypothyroidism, unspecified: Secondary | ICD-10-CM | POA: Diagnosis not present

## 2020-10-07 DIAGNOSIS — I2510 Atherosclerotic heart disease of native coronary artery without angina pectoris: Secondary | ICD-10-CM

## 2020-10-07 DIAGNOSIS — E7849 Other hyperlipidemia: Secondary | ICD-10-CM | POA: Diagnosis not present

## 2020-10-07 DIAGNOSIS — I10 Essential (primary) hypertension: Secondary | ICD-10-CM | POA: Diagnosis not present

## 2020-10-07 DIAGNOSIS — Z6838 Body mass index (BMI) 38.0-38.9, adult: Secondary | ICD-10-CM | POA: Diagnosis not present

## 2020-10-07 LAB — POCT GLYCOSYLATED HEMOGLOBIN (HGB A1C)
Est. average glucose Bld gHb Est-mCnc: 151
Hemoglobin A1C: 6.9 % — AB (ref 4.0–5.6)

## 2020-10-07 NOTE — Patient Instructions (Addendum)
STOP FUROSEMIDE AND WEIGH DAILY   Diabetes Mellitus and Nutrition, Adult When you have diabetes (diabetes mellitus), it is very important to have healthy eating habits because your blood sugar (glucose) levels are greatly affected by what you eat and drink. Eating healthy foods in the appropriate amounts, at about the same times every day, can help you:  Control your blood glucose.  Lower your risk of heart disease.  Improve your blood pressure.  Reach or maintain a healthy weight. Every person with diabetes is different, and each person has different needs for a meal plan. Your health care provider may recommend that you work with a diet and nutrition specialist (dietitian) to make a meal plan that is best for you. Your meal plan may vary depending on factors such as:  The calories you need.  The medicines you take.  Your weight.  Your blood glucose, blood pressure, and cholesterol levels.  Your activity level.  Other health conditions you have, such as heart or kidney disease. How do carbohydrates affect me? Carbohydrates, also called carbs, affect your blood glucose level more than any other type of food. Eating carbs naturally raises the amount of glucose in your blood. Carb counting is a method for keeping track of how many carbs you eat. Counting carbs is important to keep your blood glucose at a healthy level, especially if you use insulin or take certain oral diabetes medicines. It is important to know how many carbs you can safely have in each meal. This is different for every person. Your dietitian can help you calculate how many carbs you should have at each meal and for each snack. Foods that contain carbs include:  Bread, cereal, rice, pasta, and crackers.  Potatoes and corn.  Peas, beans, and lentils.  Milk and yogurt.  Fruit and juice.  Desserts, such as cakes, cookies, ice cream, and candy. How does alcohol affect me? Alcohol can cause a sudden decrease in  blood glucose (hypoglycemia), especially if you use insulin or take certain oral diabetes medicines. Hypoglycemia can be a life-threatening condition. Symptoms of hypoglycemia (sleepiness, dizziness, and confusion) are similar to symptoms of having too much alcohol. If your health care provider says that alcohol is safe for you, follow these guidelines:  Limit alcohol intake to no more than 1 drink per day for nonpregnant women and 2 drinks per day for men. One drink equals 12 oz of beer, 5 oz of wine, or 1 oz of hard liquor.  Do not drink on an empty stomach.  Keep yourself hydrated with water, diet soda, or unsweetened iced tea.  Keep in mind that regular soda, juice, and other mixers may contain a lot of sugar and must be counted as carbs. What are tips for following this plan?  Reading food labels  Start by checking the serving size on the "Nutrition Facts" label of packaged foods and drinks. The amount of calories, carbs, fats, and other nutrients listed on the label is based on one serving of the item. Many items contain more than one serving per package.  Check the total grams (g) of carbs in one serving. You can calculate the number of servings of carbs in one serving by dividing the total carbs by 15. For example, if a food has 30 g of total carbs, it would be equal to 2 servings of carbs.  Check the number of grams (g) of saturated and trans fats in one serving. Choose foods that have low or no amount of  these fats.  Check the number of milligrams (mg) of salt (sodium) in one serving. Most people should limit total sodium intake to less than 2,300 mg per day.  Always check the nutrition information of foods labeled as "low-fat" or "nonfat". These foods may be higher in added sugar or refined carbs and should be avoided.  Talk to your dietitian to identify your daily goals for nutrients listed on the label. Shopping  Avoid buying canned, premade, or processed foods. These foods  tend to be high in fat, sodium, and added sugar.  Shop around the outside edge of the grocery store. This includes fresh fruits and vegetables, bulk grains, fresh meats, and fresh dairy. Cooking  Use low-heat cooking methods, such as baking, instead of high-heat cooking methods like deep frying.  Cook using healthy oils, such as olive, canola, or sunflower oil.  Avoid cooking with butter, cream, or high-fat meats. Meal planning  Eat meals and snacks regularly, preferably at the same times every day. Avoid going long periods of time without eating.  Eat foods high in fiber, such as fresh fruits, vegetables, beans, and whole grains. Talk to your dietitian about how many servings of carbs you can eat at each meal.  Eat 4-6 ounces (oz) of lean protein each day, such as lean meat, chicken, fish, eggs, or tofu. One oz of lean protein is equal to: ? 1 oz of meat, chicken, or fish. ? 1 egg. ?  cup of tofu.  Eat some foods each day that contain healthy fats, such as avocado, nuts, seeds, and fish. Lifestyle  Check your blood glucose regularly.  Exercise regularly as told by your health care provider. This may include: ? 150 minutes of moderate-intensity or vigorous-intensity exercise each week. This could be brisk walking, biking, or water aerobics. ? Stretching and doing strength exercises, such as yoga or weightlifting, at least 2 times a week.  Take medicines as told by your health care provider.  Do not use any products that contain nicotine or tobacco, such as cigarettes and e-cigarettes. If you need help quitting, ask your health care provider.  Work with a Social worker or diabetes educator to identify strategies to manage stress and any emotional and social challenges. Questions to ask a health care provider  Do I need to meet with a diabetes educator?  Do I need to meet with a dietitian?  What number can I call if I have questions?  When are the best times to check my blood  glucose? Where to find more information:  American Diabetes Association: diabetes.org  Academy of Nutrition and Dietetics: www.eatright.[employer] of Diabetes and Digestive and Kidney Diseases (NIH): [employer] Summary  A healthy meal plan will help you control your blood glucose and maintain a healthy lifestyle.  Working with a diet and nutrition specialist (dietitian) can help you make a meal plan that is best for you.  Keep in mind that carbohydrates (carbs) and alcohol have immediate effects on your blood glucose levels. It is important to count carbs and to use alcohol carefully. This information is not intended to replace advice given to you by your health care provider. Make sure you discuss any questions you have with your health care provider. Document Revised: 09/29/2017 Document Reviewed: 11/21/2016 Elsevier Patient Education  2020 [employer].

## 2020-10-12 DIAGNOSIS — B0052 Herpesviral keratitis: Secondary | ICD-10-CM | POA: Diagnosis not present

## 2020-10-14 ENCOUNTER — Encounter: Payer: Self-pay | Admitting: *Deleted

## 2020-11-05 NOTE — Progress Notes (Signed)
Subjective:   Zachary Martinez is a 80 y.o. male who presents for Medicare Annual/Subsequent preventive examination.  I connected with Leigh Aurora today by telephone and verified that I am speaking with the correct person using two identifiers. Location patient: home Location provider: work Persons participating in the virtual visit: patient, provider.   I discussed the limitations, risks, security and privacy concerns of performing an evaluation and management service by telephone and the availability of in person appointments. I also discussed with the patient that there may be a patient responsible charge related to this service. The patient expressed understanding and verbally consented to this telephonic visit.    Interactive audio and video telecommunications were attempted between this provider and patient, however failed, due to patient having technical difficulties OR patient did not have access to video capability.  We continued and completed visit with audio only.   Review of Systems    N/A  Cardiac Risk Factors include: advanced age (>29men, >61 women);diabetes mellitus;dyslipidemia;hypertension;male gender     Objective:    Today's Vitals   11/09/20 0945  PainSc: 8    There is no height or weight on file to calculate BMI.  Advanced Directives 11/09/2020 11/05/2019 10/22/2018 10/19/2017 11/01/2016 06/07/2016 07/31/2014  Does Patient Have a Medical Advance Directive? Yes Yes Yes Yes No No No;Yes  Type of Paramedic of [place];Living will [place];Living will [place];Living will [place];Living will - - Living will  Copy of [place] in Chart? Yes - validated most recent copy scanned in chart (See row information) Yes - validated most recent copy scanned in chart (See row information) Yes - validated most recent copy scanned in chart (See row information) Yes - - -  Would patient  like information on creating a medical advance directive? - - - - No - Patient declined - -    Current Medications (verified) Outpatient Encounter Medications as of 11/09/2020  Medication Sig  . amLODipine (NORVASC) 10 MG tablet Take 1 tablet (10 mg total) by mouth daily.  Marland Kitchen atorvastatin (LIPITOR) 40 MG tablet TAKE 1 TABLET EVERY DAY  . Canagliflozin-metFORMIN HCl (INVOKAMET) [id number] MG TABS Take 1 tablet by mouth 2 (two) times daily.  . carvedilol (COREG) 6.25 MG tablet Take 1 tablet (6.25 mg total) by mouth 2 (two) times daily.  . Cholecalciferol (VITAMIN D3) 1000 UNITS CAPS Take 2,000 Units by mouth daily.   Marland Kitchen EPINEPHRINE, ANAPHYLAXIS THERAPY AGENTS, EPIPEN 2-PAK, 0.3MG /0.3ML (Injection Device) - Historical Medication  as directed (0.3 MG/0.3ML) Active  . hydrALAZINE (APRESOLINE) 100 MG tablet Take 1 tablet (100 mg total) by mouth 2 (two) times daily.  Marland Kitchen levothyroxine (SYNTHROID) 88 MCG tablet Take 1 tablet (88 mcg total) by mouth daily.  . pioglitazone (ACTOS) 30 MG tablet Take 1 tablet (30 mg total) by mouth daily.  . prednisoLONE acetate (PRED FORTE) 1 % ophthalmic suspension Place 1 drop into the left eye daily at 6 (six) AM.  . valACYclovir (VALTREX) 500 MG tablet Take 500 mg by mouth daily.  . furosemide (LASIX) 20 MG tablet Take 1 tablet by mouth every morning. (Patient not taking: Reported on 11/09/2020)   No facility-administered encounter medications on file as of 11/09/2020.    Allergies (verified) Ace inhibitors   History: Past Medical History:  Diagnosis Date  . Arthritis    knees   . CAD (coronary artery disease)   . Cataract    removed right eye.  left eye forming   . Diabetes ([place])   . Diverticulosis   . Hyperlipidemia   . Hypertension   . Hypothyroidism   . Neuromuscular disorder ([facility])    neuropathy  . Sleep apnea    cpap q night   Past Surgical History:  Procedure Laterality Date  . CATARACT EXTRACTION W/PHACO Right 11/01/2016   Procedure: CATARACT  EXTRACTION PHACO AND INTRAOCULAR LENS PLACEMENT (IOC);  Surgeon: Birder Robson, MD;  Location: [facility] ORS;  Service: Ophthalmology;  Laterality: Right;  Lot #[id number] H [place]: oo:35.4 AP%; 22.4 CDE: 7.91  . COLONOSCOPY    . CYST REMOVAL NECK    . EYE SURGERY     to remove blood from busted blood vessel  . KNEE ARTHROSCOPY Right 2003, 2005   x2  . KNEE ARTHROSCOPY Left 2013  . nasal polyps  1978  . POLYPECTOMY    . TIBIA FRACTURE SURGERY Left 2012  . TONSILLECTOMY AND ADENOIDECTOMY    . uvuloplasty  2000   Family History  Problem Relation Age of Onset  . Colon cancer Brother 71  . Colon cancer Maternal Aunt 61  . Colon cancer Maternal Aunt 50  . Kidney disease Mother   . Anemia Mother   . Diabetes Brother   . Hyperlipidemia Brother   . Hypertension Brother   . Colon polyps Brother   . Esophageal cancer Neg Hx   . Rectal cancer Neg Hx   . Stomach cancer Neg Hx    Social History   Socioeconomic History  . Marital status: Married    Spouse name: Not on file  . Number of children: 3  . Years of education: Not on file  . Highest education level: Associate degree: occupational, Hotel manager, or vocational program  Occupational History  . Occupation: retired  Tobacco Use  . Smoking status: Former Smoker    Quit date: 07/24/1979    Years since quitting: 41.3  . Smokeless tobacco: Former Systems developer    Quit date: 11/01/1979  Vaping Use  . Vaping Use: Never used  Substance and Sexual Activity  . Alcohol use: Not Currently    Comment: wine 2-3xs a year  . Drug use: No  . Sexual activity: Not on file  Other Topics Concern  . Not on file  Social History Narrative  . Not on file   Social Determinants of Health   Financial Resource Strain: Low Risk   . Difficulty of Paying Living Expenses: Not hard at all  Food Insecurity: No Food Insecurity  . Worried About Charity fundraiser in the Last Year: Never true  . Ran Out of Food in the Last Year: Never true  Transportation Needs: No  Transportation Needs  . Lack of Transportation (Medical): No  . Lack of Transportation (Non-Medical): No  Physical Activity: Inactive  . Days of Exercise per Week: 0 days  . Minutes of Exercise per Session: 0 min  Stress: No Stress Concern Present  . Feeling of Stress : Not at all  Social Connections: Moderately Integrated  . Frequency of Communication with Friends and Family: More than three times a week  . Frequency of Social Gatherings with Friends and Family: More than three times a week  . Attends Religious Services: More than 4 times per year  . Active Member of Clubs or Organizations: No  . Attends Archivist Meetings: Never  . Marital Status: Married    Tobacco Counseling Counseling given: Not Answered   Clinical Intake:  Pre-visit preparation completed: Yes  Pain : 0-10 Pain Score: 8  Pain Type: Acute pain Pain Location: Back Pain Orientation: Upper Pain Descriptors / Indicators: Throbbing Pain Frequency: Constant Pain Relieving Factors: Taking Advil as needed for pain.  Pain Relieving Factors: Taking Advil as needed for pain.  Nutritional Risks: None Diabetes: Yes  How often do you need to have someone help you when you read instructions, pamphlets, or other written materials from your doctor or pharmacy?: 2 - Rarely  Diabetic? Yes  Nutrition Risk Assessment:  Has the patient had any N/V/D within the last 2 months?  No  Does the patient have any non-healing wounds?  No  Has the patient had any unintentional weight loss or weight gain?  No   Diabetes:  Is the patient diabetic?  Yes  If diabetic, was a CBG obtained today?  No  Did the patient bring in their glucometer from home?  No  How often do you monitor your CBG's? Once a month or as needed.   Financial Strains and Diabetes Management:  Are you having any financial strains with the device, your supplies or your medication? No .  Does the patient want to be seen by Chronic Care  Management for management of their diabetes?  No  Would the patient like to be referred to a Nutritionist or for Diabetic Management?  No   Diabetic Exams:  Diabetic Eye Exam: Overdue for diabetic eye exam. Pt has been advised about the importance in completing this exam.  Diabetic Foot Exam: Completed 12/26/19   Interpreter Needed?: No  Information entered by :: [facility], LPN   Activities of Daily Living In your present state of health, do you have any difficulty performing the following activities: 11/09/2020 10/07/2020  Hearing? Y N  Comment Has tinnitus in both ears. -  Vision? Y Y  Comment Due to previous ruptured blood vessel behind the right eye. -  Difficulty concentrating or making decisions? N N  Walking or climbing stairs? Y Y  Comment Due to knee pains. -  Dressing or bathing? N N  Doing errands, shopping? N N  Preparing Food and eating ? N -  Using the Toilet? N -  In the past six months, have you accidently leaked urine? Y -  Comment Due to lasix use. -  Do you have problems with loss of bowel control? N -  Managing your Medications? N -  Managing your Finances? N -  Housekeeping or managing your Housekeeping? N -  Some recent data might be hidden    Patient Care Team: Jerrol Banana., MD as PCP - General (Family Medicine) Birder Robson, MD as Referring Physician (Ophthalmology) Corey Skains, MD as Consulting Physician (Cardiology) Hessie Knows, MD as Consulting Physician (Orthopedic Surgery) Dasher, Rayvon Char, MD (Dermatology) Leandrew Koyanagi, MD as Referring Physician (Ophthalmology)  Indicate any recent Medical Services you may have received from other than [facility] providers in the past year (date may be approximate).     Assessment:   This is a routine wellness examination for Jaythan.  Hearing/Vision screen No exam data present  Dietary issues and exercise activities discussed: Current Exercise Habits: The patient does not  participate in regular exercise at present, Exercise limited by: None identified  Goals    . DIET - REDUCE PORTION SIZE     Recommend cutting portion sizes in half and eating 3 small meals a day with 2 healthy snacks in between.     . Exercise 3x per week (30 min per  time)     Recommend to exercise for 3 days a week for at least 30 minutes at a time.        Depression Screen PHQ 2/9 Scores 11/09/2020 10/07/2020 11/05/2019 11/05/2019 10/22/2018 10/19/2017 10/18/2016  PHQ - 2 Score 0 0 0 0 0 0 0  PHQ- 9 Score - 3 - - - - -    Fall Risk Fall Risk  11/09/2020 10/07/2020 11/05/2019 10/22/2018 10/19/2017  Falls in the past year? 1 1 0 0 Yes  Number falls in past yr: 1 1 0 0 1  Comment - - - - tripped over a tree  Injury with Fall? 0 0 0 0 No  Risk for fall due to : Impaired balance/gait History of fall(s) - - -  Follow up Falls prevention discussed Falls evaluation completed;Education provided - - Falls prevention discussed    FALL RISK PREVENTION PERTAINING TO THE HOME:  Any stairs in or around the home? Yes  If so, are there any without handrails? No  Home free of loose throw rugs in walkways, pet beds, electrical cords, etc? Yes  Adequate lighting in your home to reduce risk of falls? Yes   ASSISTIVE DEVICES UTILIZED TO PREVENT FALLS:  Life alert? No  Use of a cane, walker or w/c? No  Grab bars in the bathroom? Yes  Shower chair or bench in shower? Yes  Elevated toilet seat or a handicapped toilet? Yes    Cognitive Function: Normal cognitive status assessed by observation by this Nurse Health Advisor. No abnormalities found.       6CIT Screen 10/22/2018  What Year? 0 points  What month? 0 points  What time? 0 points  Count back from 20 0 points  Months in reverse 0 points  Repeat phrase 0 points  Total Score 0    Immunizations Immunization History  Administered Date(s) Administered  . Influenza Split 08/21/2012  . Influenza, High Dose Seasonal PF 07/22/2014, 08/17/2017,  07/17/2019, 08/07/2020  . Influenza-Unspecified 09/07/2015, 08/15/2018  . PFIZER SARS-COV-2 Vaccination 11/07/2019, 11/28/2019, 08/07/2020  . Pneumococcal Conjugate-13 06/16/2014  . Pneumococcal Polysaccharide-23 06/18/2012  . Td 11/21/2003, 01/24/2015  . Zoster 06/18/2012    TDAP status: Up to date  Flu Vaccine status: Up to date  Pneumococcal vaccine status: Up to date  Covid-19 vaccine status: Completed vaccines  Qualifies for Shingles Vaccine? Yes   Zostavax completed Yes   Shingrix Completed?: No.    Education has been provided regarding the importance of this vaccine. Patient has been advised to call insurance company to determine out of pocket expense if they have not yet received this vaccine. Advised may also receive vaccine at local pharmacy or Health Dept. Verbalized acceptance and understanding.  Screening Tests Health Maintenance  Topic Date Due  . OPHTHALMOLOGY EXAM  09/08/2020  . FOOT EXAM  12/25/2020  . COVID-19 Vaccine (4 - Booster for Pfizer series) 02/05/2021  . HEMOGLOBIN A1C  04/07/2021  . TETANUS/TDAP  01/23/2025  . INFLUENZA VACCINE  Completed  . PNA vac Low Risk Adult  Completed    Health Maintenance  Health Maintenance Due  Topic Date Due  . OPHTHALMOLOGY EXAM  09/08/2020    Colorectal cancer screening: No longer required.   Lung Cancer Screening: (Low Dose CT Chest recommended if Age 23-80 years, 30 pack-year currently smoking OR have quit w/in 15years.) does not qualify.   Additional Screening:  Vision Screening: Recommended annual ophthalmology exams for early detection of glaucoma and other disorders of the eye. Is  the patient up to date with their annual eye exam?  Yes  Who is the provider or what is the name of the office in which the patient attends annual eye exams? Dr George Ina If pt is not established with a provider, would they like to be referred to a provider to establish care? No .   Dental Screening: Recommended annual dental  exams for proper oral hygiene  Community Resource Referral / Chronic Care Management: CRR required this visit?  No   CCM required this visit?  No      Plan:     I have personally reviewed and noted the following in the patient's chart:   . Medical and social history . Use of alcohol, tobacco or illicit drugs  . Current medications and supplements . Functional ability and status . Nutritional status . Physical activity . Advanced directives . List of other physicians . Hospitalizations, surgeries, and ER visits in previous 12 months . Vitals . Screenings to include cognitive, depression, and falls . Referrals and appointments  In addition, I have reviewed and discussed with patient certain preventive protocols, quality metrics, and best practice recommendations. A written personalized care plan for preventive services as well as general preventive health recommendations were provided to patient.     Anyela Napierkowski [place], [place]   6/65/9935   Nurse Notes: Next eye exam is scheduled for 11/26/20.

## 2020-11-09 ENCOUNTER — Telehealth: Payer: Self-pay

## 2020-11-09 ENCOUNTER — Other Ambulatory Visit: Payer: Self-pay

## 2020-11-09 ENCOUNTER — Ambulatory Visit (INDEPENDENT_AMBULATORY_CARE_PROVIDER_SITE_OTHER): Payer: Medicare Other

## 2020-11-09 ENCOUNTER — Ambulatory Visit (INDEPENDENT_AMBULATORY_CARE_PROVIDER_SITE_OTHER): Payer: Medicare Other | Admitting: Physician Assistant

## 2020-11-09 ENCOUNTER — Ambulatory Visit
Admission: RE | Admit: 2020-11-09 | Discharge: 2020-11-09 | Disposition: A | Discharge status: Home / Self Care | Payer: Medicare Other | Attending: Physician Assistant | Admitting: Physician Assistant

## 2020-11-09 ENCOUNTER — Ambulatory Visit
Admission: RE | Admit: 2020-11-09 | Discharge: 2020-11-09 | Disposition: A | Discharge status: Home / Self Care | Payer: Medicare Other | Source: Ambulatory Visit | Attending: Physician Assistant | Admitting: Physician Assistant

## 2020-11-09 ENCOUNTER — Ambulatory Visit: Payer: Self-pay | Admitting: *Deleted

## 2020-11-09 ENCOUNTER — Encounter: Payer: Self-pay | Admitting: Physician Assistant

## 2020-11-09 VITALS — BP 158/73 | HR 60 | Temp 98.4°F | Ht 69.0 in | Wt 244.0 lb

## 2020-11-09 DIAGNOSIS — M62838 Other muscle spasm: Secondary | ICD-10-CM | POA: Insufficient documentation

## 2020-11-09 DIAGNOSIS — Z0000 Encounter for general adult medical examination without abnormal findings: Secondary | ICD-10-CM

## 2020-11-09 MED ORDER — CYCLOBENZAPRINE HCL 5 MG PO TABS: 5.0000 mg | ORAL_TABLET | Freq: Three times a day (TID) | ORAL | 1 refills | Status: DC | PRN

## 2020-11-09 MED ORDER — METHYLPREDNISOLONE 4 MG PO TBPK: ORAL_TABLET | ORAL | 0 refills | Status: DC

## 2020-11-09 NOTE — Patient Instructions (Signed)
Zachary Martinez , Thank you for taking time to come for your Medicare Wellness Visit. I appreciate your ongoing commitment to your health goals. Please review the following plan we discussed and let me know if I can assist you in the future.   Screening recommendations/referrals: Colonoscopy: No longer required.  Recommended yearly ophthalmology/optometry visit for glaucoma screening and checkup Recommended yearly dental visit for hygiene and checkup  Vaccinations: Influenza vaccine: Done 08/07/20 Pneumococcal vaccine: Completed series Tdap vaccine: Up to date, due 12/2024 Shingles vaccine: Shingrix discussed. Please contact your pharmacy for coverage information.     Advanced directives: Currently on file.  Conditions/risks identified: Recommend to exercise for 3 days a week for at least 30 minutes at a time.    Next appointment: Today @ 3:00 PM with [place] 65 Years and Older, Male Preventive care refers to lifestyle choices and visits with your health care provider that can promote health and wellness. What does preventive care include?  A yearly physical exam. This is also called an annual well check.  Dental exams once or twice a year.  Routine eye exams. Ask your health care provider how often you should have your eyes checked.  Personal lifestyle choices, including:  Daily care of your teeth and gums.  Regular physical activity.  Eating a healthy diet.  Avoiding tobacco and drug use.  Limiting alcohol use.  Practicing safe sex.  Taking low doses of aspirin every day.  Taking vitamin and mineral supplements as recommended by your health care provider. What happens during an annual well check? The services and screenings done by your health care provider during your annual well check will depend on your age, overall health, lifestyle risk factors, and family history of disease. Counseling  Your health care provider may ask you questions about  your:  Alcohol use.  Tobacco use.  Drug use.  Emotional well-being.  Home and relationship well-being.  Sexual activity.  Eating habits.  History of falls.  Memory and ability to understand (cognition).  Work and work Statistician. Screening  You may have the following tests or measurements:  Height, weight, and BMI.  Blood pressure.  Lipid and cholesterol levels. These may be checked every 5 years, or more frequently if you are over 62 years old.  Skin check.  Lung cancer screening. You may have this screening every year starting at age 35 if you have a 30-pack-year history of smoking and currently smoke or have quit within the past 15 years.  Fecal occult blood test (FOBT) of the stool. You may have this test every year starting at age 26.  Flexible sigmoidoscopy or colonoscopy. You may have a sigmoidoscopy every 5 years or a colonoscopy every 10 years starting at age 47.  Prostate cancer screening. Recommendations will vary depending on your family history and other risks.  Hepatitis C blood test.  Hepatitis B blood test.  Sexually transmitted disease (STD) testing.  Diabetes screening. This is done by checking your blood sugar (glucose) after you have not eaten for a while (fasting). You may have this done every 1-3 years.  Abdominal aortic aneurysm (AAA) screening. You may need this if you are a current or former smoker.  Osteoporosis. You may be screened starting at age 57 if you are at high risk. Talk with your health care provider about your test results, treatment options, and if necessary, the need for more tests. Vaccines  Your health care provider may recommend certain vaccines, such as:  Influenza vaccine. This is recommended every year.  Tetanus, diphtheria, and acellular pertussis (Tdap, Td) vaccine. You may need a Td booster every 10 years.  Zoster vaccine. You may need this after age 24.  Pneumococcal 13-valent conjugate (PCV13) vaccine.  One dose is recommended after age 9.  Pneumococcal polysaccharide (PPSV23) vaccine. One dose is recommended after age 72. Talk to your health care provider about which screenings and vaccines you need and how often you need them. This information is not intended to replace advice given to you by your health care provider. Make sure you discuss any questions you have with your health care provider. Document Released: 11/13/2015 Document Revised: 07/06/2016 Document Reviewed: 08/18/2015 Elsevier Interactive Patient Education  2017 [place] Prevention in the Home Falls can cause injuries. They can happen to people of all ages. There are many things you can do to make your home safe and to help prevent falls. What can I do on the outside of my home?  Regularly fix the edges of walkways and driveways and fix any cracks.  Remove anything that might make you trip as you walk through a door, such as a raised step or threshold.  Trim any bushes or trees on the path to your home.  Use bright outdoor lighting.  Clear any walking paths of anything that might make someone trip, such as rocks or tools.  Regularly check to see if handrails are loose or broken. Make sure that both sides of any steps have handrails.  Any raised decks and porches should have guardrails on the edges.  Have any leaves, snow, or ice cleared regularly.  Use sand or salt on walking paths during winter.  Clean up any spills in your garage right away. This includes oil or grease spills. What can I do in the bathroom?  Use night lights.  Install grab bars by the toilet and in the tub and shower. Do not use towel bars as grab bars.  Use non-skid mats or decals in the tub or shower.  If you need to sit down in the shower, use a plastic, non-slip stool.  Keep the floor dry. Clean up any water that spills on the floor as soon as it happens.  Remove soap buildup in the tub or shower regularly.  Attach bath  mats securely with double-sided non-slip rug tape.  Do not have throw rugs and other things on the floor that can make you trip. What can I do in the bedroom?  Use night lights.  Make sure that you have a light by your bed that is easy to reach.  Do not use any sheets or blankets that are too big for your bed. They should not hang down onto the floor.  Have a firm chair that has side arms. You can use this for support while you get dressed.  Do not have throw rugs and other things on the floor that can make you trip. What can I do in the kitchen?  Clean up any spills right away.  Avoid walking on wet floors.  Keep items that you use a lot in easy-to-reach places.  If you need to reach something above you, use a strong step stool that has a grab bar.  Keep electrical cords out of the way.  Do not use floor polish or wax that makes floors slippery. If you must use wax, use non-skid floor wax.  Do not have throw rugs and other things on the floor that  can make you trip. What can I do with my stairs?  Do not leave any items on the stairs.  Make sure that there are handrails on both sides of the stairs and use them. Fix handrails that are broken or loose. Make sure that handrails are as long as the stairways.  Check any carpeting to make sure that it is firmly attached to the stairs. Fix any carpet that is loose or worn.  Avoid having throw rugs at the top or bottom of the stairs. If you do have throw rugs, attach them to the floor with carpet tape.  Make sure that you have a light switch at the top of the stairs and the bottom of the stairs. If you do not have them, ask someone to add them for you. What else can I do to help prevent falls?  Wear shoes that:  Do not have high heels.  Have rubber bottoms.  Are comfortable and fit you well.  Are closed at the toe. Do not wear sandals.  If you use a stepladder:  Make sure that it is fully opened. Do not climb a closed  stepladder.  Make sure that both sides of the stepladder are locked into place.  Ask someone to hold it for you, if possible.  Clearly mark and make sure that you can see:  Any grab bars or handrails.  First and last steps.  Where the edge of each step is.  Use tools that help you move around (mobility aids) if they are needed. These include:  Canes.  Walkers.  Scooters.  Crutches.  Turn on the lights when you go into a dark area. Replace any light bulbs as soon as they burn out.  Set up your furniture so you have a clear path. Avoid moving your furniture around.  If any of your floors are uneven, fix them.  If there are any pets around you, be aware of where they are.  Review your medicines with your doctor. Some medicines can make you feel dizzy. This can increase your chance of falling. Ask your doctor what other things that you can do to help prevent falls. This information is not intended to replace advice given to you by your health care provider. Make sure you discuss any questions you have with your health care provider. Document Released: 08/13/2009 Document Revised: 03/24/2016 Document Reviewed: 11/21/2014 Elsevier Interactive Patient Education  2017 [employer].

## 2020-11-09 NOTE — Progress Notes (Signed)
Acute Office Visit  Subjective:    Patient ID: Zachary Martinez, male    DOB: Sep 09, 1941, 80 y.o.   MRN: [id number]  Chief Complaint  Patient presents with  . Neck Pain    HPI Patient is in today for neck pain. Reports neck pain started Thursday, 11/05/20. No known injury. Pain is located at the occipital protuberance, mostly on left side and radiates into the musculature on the left. Has been trying ice, heat, veterinary liniment gel for sore muscles, and NSAIDs without improvement. Denies any true radiculopathy, but does report he had one episode of numbness radiating into the left hand. Pain has been affecting sleep. Worst day was Friday night into Saturday morning. Has been slowly improving, but still has intermittent pain.   Past Medical History:  Diagnosis Date  . Arthritis    knees   . CAD (coronary artery disease)   . Cataract    removed right eye. left eye forming   . Diabetes ([place])   . Diverticulosis   . Hyperlipidemia   . Hypertension   . Hypothyroidism   . Neuromuscular disorder ([facility])    neuropathy  . Sleep apnea    cpap q night    Past Surgical History:  Procedure Laterality Date  . CATARACT EXTRACTION W/PHACO Right 11/01/2016   Procedure: CATARACT EXTRACTION PHACO AND INTRAOCULAR LENS PLACEMENT (IOC);  Surgeon: Birder Robson, MD;  Location: [facility] ORS;  Service: Ophthalmology;  Laterality: Right;  Lot #[id number] [place]: oo:35.4 AP%; 22.4 CDE: 7.91  . COLONOSCOPY    . CYST REMOVAL NECK    . EYE SURGERY     to remove blood from busted blood vessel  . KNEE ARTHROSCOPY Right 2003, 2005   x2  . KNEE ARTHROSCOPY Left 2013  . nasal polyps  1978  . POLYPECTOMY    . TIBIA FRACTURE SURGERY Left 2012  . TONSILLECTOMY AND ADENOIDECTOMY    . uvuloplasty  2000    Family History  Problem Relation Age of Onset  . Colon cancer Brother 16  . Colon cancer Maternal Aunt 48  . Colon cancer Maternal Aunt 14  . Kidney disease Mother   . Anemia Mother   . Diabetes Brother   .  Hyperlipidemia Brother   . Hypertension Brother   . Colon polyps Brother   . Esophageal cancer Neg Hx   . Rectal cancer Neg Hx   . Stomach cancer Neg Hx     Social History   Socioeconomic History  . Marital status: Married    Spouse name: Not on file  . Number of children: 3  . Years of education: Not on file  . Highest education level: Associate degree: occupational, Hotel manager, or vocational program  Occupational History  . Occupation: retired  Tobacco Use  . Smoking status: Former Smoker    Quit date: 07/24/1979    Years since quitting: 41.3  . Smokeless tobacco: Former Systems developer    Quit date: 11/01/1979  Vaping Use  . Vaping Use: Never used  Substance and Sexual Activity  . Alcohol use: Not Currently    Comment: wine 2-3xs a year  . Drug use: No  . Sexual activity: Not on file  Other Topics Concern  . Not on file  Social History Narrative  . Not on file   Social Determinants of Health   Financial Resource Strain: Low Risk   . Difficulty of Paying Living Expenses: Not hard at all  Food Insecurity: No Food Insecurity  . Worried About Running  Out of Food in the Last Year: Never true  . Ran Out of Food in the Last Year: Never true  Transportation Needs: No Transportation Needs  . Lack of Transportation (Medical): No  . Lack of Transportation (Non-Medical): No  Physical Activity: Inactive  . Days of Exercise per Week: 0 days  . Minutes of Exercise per Session: 0 min  Stress: No Stress Concern Present  . Feeling of Stress : Not at all  Social Connections: Moderately Integrated  . Frequency of Communication with Friends and Family: More than three times a week  . Frequency of Social Gatherings with Friends and Family: More than three times a week  . Attends Religious Services: More than 4 times per year  . Active Member of Clubs or Organizations: No  . Attends Archivist Meetings: Never  . Marital Status: Married  Human resources officer Violence: Not At Risk  . Fear  of Current or Ex-Partner: No  . Emotionally Abused: No  . Physically Abused: No  . Sexually Abused: No    Outpatient Medications Prior to Visit  Medication Sig Dispense Refill  . amLODipine (NORVASC) 10 MG tablet Take 1 tablet (10 mg total) by mouth daily. 90 tablet 3  . atorvastatin (LIPITOR) 40 MG tablet TAKE 1 TABLET EVERY DAY 90 tablet 3  . Canagliflozin-metFORMIN HCl (INVOKAMET) 3030409053 MG TABS Take 1 tablet by mouth 2 (two) times daily. 180 tablet 3  . carvedilol (COREG) 6.25 MG tablet Take 1 tablet (6.25 mg total) by mouth 2 (two) times daily. 180 tablet 1  . Cholecalciferol (VITAMIN D3) 1000 UNITS CAPS Take 2,000 Units by mouth daily.     Marland Kitchen EPINEPHRINE, ANAPHYLAXIS THERAPY AGENTS, EPIPEN 2-PAK, 0.3MG /0.3ML (Injection Device) - Historical Medication  as directed (0.3 MG/0.3ML) Active    . hydrALAZINE (APRESOLINE) 100 MG tablet Take 1 tablet (100 mg total) by mouth 2 (two) times daily. 180 tablet 3  . levothyroxine (SYNTHROID) 88 MCG tablet Take 1 tablet (88 mcg total) by mouth daily. 90 tablet 2  . pioglitazone (ACTOS) 30 MG tablet Take 1 tablet (30 mg total) by mouth daily. 90 tablet 3  . prednisoLONE acetate (PRED FORTE) 1 % ophthalmic suspension Place 1 drop into the left eye daily at 6 (six) AM.    . valACYclovir (VALTREX) 500 MG tablet Take 500 mg by mouth daily.    . furosemide (LASIX) 20 MG tablet Take 1 tablet by mouth every morning. (Patient not taking: No sig reported)     No facility-administered medications prior to visit.    Allergies  Allergen Reactions  . Ace Inhibitors Anaphylaxis and Other (See Comments)    Review of Systems  Constitutional: Negative.   Respiratory: Negative.   Cardiovascular: Negative.   Musculoskeletal: Positive for myalgias, neck pain and neck stiffness.  Neurological: Negative for dizziness, weakness, light-headedness, numbness and headaches.       Objective:    Physical Exam Vitals reviewed.  Constitutional:      General: He  is not in acute distress.    Appearance: Normal appearance. He is well-developed and well-nourished. He is obese. He is not ill-appearing.  HENT:     Head: Normocephalic and atraumatic.  Eyes:     Extraocular Movements: EOM normal.     Conjunctiva/sclera: Conjunctivae normal.  Neck:   Pulmonary:     Effort: Pulmonary effort is normal. No respiratory distress.  Musculoskeletal:     Cervical back: Neck supple. No edema, erythema, signs of trauma, torticollis or crepitus. Pain  with movement and muscular tenderness (left upper trapezius) present. No spinous process tenderness. Decreased range of motion (rotation and lateral flexion).  Neurological:     Mental Status: He is alert.  Psychiatric:        Mood and Affect: Mood and affect normal.        Behavior: Behavior normal.        Thought Content: Thought content normal.        Judgment: Judgment normal.     BP (!) 158/73 (BP Location: Left Arm, Patient Position: Sitting, Cuff Size: Large)   Pulse 60   Temp 98.4 F (36.9 C) (Oral)   Ht 5\' 9"  (1.753 m)   Wt 244 lb (110.7 kg)   BMI 36.03 kg/m  Wt Readings from Last 3 Encounters:  11/09/20 244 lb (110.7 kg)  10/07/20 253 lb (114.8 kg)  05/20/20 244 lb (110.7 kg)    Health Maintenance Due  Topic Date Due  . OPHTHALMOLOGY EXAM  09/08/2020    There are no preventive care reminders to display for this patient.   Lab Results  Component Value Date   TSH 3.100 05/21/2020   Lab Results  Component Value Date   WBC 4.7 05/21/2020   HGB 14.3 05/21/2020   HCT 43.3 05/21/2020   MCV 94 05/21/2020   PLT 169 05/21/2020   Lab Results  Component Value Date   NA 141 05/21/2020   K 4.5 05/21/2020   CO2 23 05/21/2020   GLUCOSE 165 (H) 05/21/2020   BUN 26 05/21/2020   CREATININE 0.96 05/21/2020   BILITOT 0.4 05/21/2020   ALKPHOS 94 05/21/2020   AST 15 05/21/2020   ALT 17 05/21/2020   PROT 6.3 05/21/2020   ALBUMIN 4.2 05/21/2020   CALCIUM 10.7 (H) 05/21/2020   ANIONGAP 8  12/06/2012   Lab Results  Component Value Date   CHOL 119 05/21/2020   Lab Results  Component Value Date   HDL 29 (L) 05/21/2020   Lab Results  Component Value Date   LDLCALC 52 05/21/2020   Lab Results  Component Value Date   TRIG 234 (H) 05/21/2020   Lab Results  Component Value Date   CHOLHDL 4.1 05/21/2020   Lab Results  Component Value Date   HGBA1C 6.9 (A) 10/07/2020   CLINICAL DATA:  Neck pain and spasm. Suspected OA. No known injury.  EXAM: CERVICAL SPINE - COMPLETE 4+ VIEW  COMPARISON:  None.  FINDINGS: Normal alignment. Vertebral body heights are normal. The dens appears intact. Mild endplate spurring from F0-Y7 through C6-C7. Relative preservation of disc spaces. There is scattered multilevel facet hypertrophy. Lateral masses of C1 well aligned on C2. No severe bony neural foraminal stenosis. No evidence of fracture, focal bone lesion or bone destruction. No prevertebral soft tissue edema. Lung apices are clear.  IMPRESSION: Mild degenerative endplate spurring from X4-J2 through C6-C7. Scattered facet hypertrophy.   Electronically Signed   By: Keith Rake M.D.   On: 11/09/2020 19:41    Assessment & Plan:   Problem List Items Addressed This Visit   None   Visit Diagnoses    Neck muscle spasm    -  Primary   Relevant Medications   methylPREDNISolone (MEDROL) 4 MG TBPK tablet   cyclobenzaprine (FLEXERIL) 5 MG tablet   Other Relevant Orders   DG Cervical Spine Complete (Completed)       Meds ordered this encounter  Medications  . methylPREDNISolone (MEDROL) 4 MG TBPK tablet    Sig: 6 day  taper; take as directed on package instructions    Dispense:  21 tablet    Refill:  0    Order Specific Question:   Supervising Provider    Answer:   Virginia Crews [[id number]]  . cyclobenzaprine (FLEXERIL) 5 MG tablet    Sig: Take 1 tablet (5 mg total) by mouth 3 (three) times daily as needed for muscle spasms.    Dispense:  30 tablet     Refill:  1    Order Specific Question:   Supervising Provider    Answer:   Virginia Crews [id number]   Will treat with medications as noted above, medrol and flexeril. Continue moist heat. Neck stretches and exercises provided on AVS. Imaging obtained and reviewed. No bony abnormality that would most likely contribute. Was noted to have mild arthritic changes and facet hypertrophy of the cervical spine. Will try medications as noted above and patient is to call if not improving or worsening. May benefit from Ortho referral and/or PT if not improving.   Mar Daring, PA-C

## 2020-11-09 NOTE — Telephone Encounter (Signed)
Patient was seen in office by Fenton Malling. KW

## 2020-11-09 NOTE — Telephone Encounter (Signed)
Patient is calling to report neck pain since last Thursday. Patient states he has tried heat and medication and is not helping. Painful when tries to turn head- but can move head up and down. First available appointment given for evaluation- patient would like to come in sooner if possible.   Reason for Disposition . [1] SEVERE neck pain (e.g., excruciating, unable to do any normal activities) AND [2] not improved after 2 hours of pain medicine  Answer Assessment - Initial Assessment Questions 1. ONSET: "When did the pain begin?"      Since Thrusday 2. LOCATION: "Where does it hurt?"      At top of pain 3. PATTERN "Does the pain come and go, or has it been constant since it started?"      constant 4. SEVERITY: "How bad is the pain?"  (Scale 1-10; or mild, moderate, severe)   - NO PAIN (0): no pain or only slight stiffness    - MILD (1-3): doesn't interfere with normal activities    - MODERATE (4-7): interferes with normal activities or awakens from sleep    - SEVERE (8-10):  excruciating pain, unable to do any normal activities      severe 5. RADIATION: "Does the pain go anywhere else, shoot into your arms?"     Slight radiation into shoulder at time 6. CORD SYMPTOMS: "Any weakness or numbness of the arms or legs?"     No- some tingling in left hand this week end 7. CAUSE: "What do you think is causing the neck pain?"     No sure 8. NECK OVERUSE: "Any recent activities that involved turning or twisting the neck?"     Not that can recall 9. OTHER SYMPTOMS: "Do you have any other symptoms?" (e.g., headache, fever, chest pain, difficulty breathing, neck swelling)     no 10. PREGNANCY: "Is there any chance you are pregnant?" "When was your last menstrual period?"       n/a  Protocols used: NECK PAIN OR STIFFNESS-A-AH

## 2020-11-09 NOTE — Telephone Encounter (Signed)
Copied from [place] [phone] [phone]. Topic: Appointment Scheduling - Scheduling Inquiry for Clinic >> Nov 09, 2020 10:42 AM Reyne Dumas L wrote: Reason for CRM: Patient states he missed a call from the office.  Patient stated that he was waiting to see if he could be seen earlier and feels that this is what the call was about.  Per the [facility] send message.  Patient can be reached at [phone].

## 2020-11-10 ENCOUNTER — Encounter: Payer: Self-pay | Admitting: Physician Assistant

## 2020-11-10 NOTE — Telephone Encounter (Signed)
It looks like I have some appointment that might be available tomorrow.

## 2020-11-10 NOTE — Patient Instructions (Signed)

## 2020-11-10 NOTE — Telephone Encounter (Signed)
Would you like to work the patient in? Please advise. Thanks!

## 2020-11-19 ENCOUNTER — Other Ambulatory Visit: Payer: Self-pay | Admitting: Family Medicine

## 2020-11-19 DIAGNOSIS — E7849 Other hyperlipidemia: Secondary | ICD-10-CM

## 2020-11-26 DIAGNOSIS — E119 Type 2 diabetes mellitus without complications: Secondary | ICD-10-CM | POA: Diagnosis not present

## 2020-11-26 LAB — HM DIABETES EYE EXAM

## 2020-12-07 ENCOUNTER — Other Ambulatory Visit: Payer: Self-pay | Admitting: Family Medicine

## 2020-12-07 DIAGNOSIS — I10 Essential (primary) hypertension: Secondary | ICD-10-CM

## 2021-01-04 NOTE — Progress Notes (Signed)
Established patient visit   Patient: Zachary Martinez   DOB: 1940-11-08   80 y.o. Male  MRN: [id number] Visit Date: 01/05/2021  Today's healthcare provider: [name], MD   Chief Complaint  Patient presents with  . Diabetes  . Hypertension  . Sleep Apnea   Subjective    HPI  Overall patient feels well.  He is having no problems with diabetes chest pain shortness of breath. He is having chronic right knee pain which hurts to walk.  Left knee also but has an old injury. Is also had recent occipital headaches which have responded nicely to muscle relaxant. Diabetes Mellitus Type II, follow-up  Lab Results  Component Value Date   HGBA1C 6.9 (A) 01/05/2021   HGBA1C 6.9 (A) 10/07/2020   HGBA1C 6.8 (H) 05/21/2020   Last seen for diabetes 3 months ago.  Management since then includes; Diabetic control has been good since changing to invoke a met. He reports good compliance with treatment. He is not having side effects.   Home blood sugar records: not being checked  Episodes of hypoglycemia? No    Current insulin regiment: none Most Recent Eye Exam: up to date.   Hypertension, follow-up  BP Readings from Last 3 Encounters:  01/05/21 (!) 138/57  11/09/20 (!) 158/73  10/07/20 133/63   Wt Readings from Last 3 Encounters:  01/05/21 245 lb (111.1 kg)  11/09/20 244 lb (110.7 kg)  10/07/20 253 lb (114.8 kg)     He was last seen for hypertension 3 months ago.  BP at that visit was 133/63. Management since that visit includes; Not furosemide and see if the leaking of urine stops.  I think it is mainly due to [place]. Patient would rather stay with Invokana. He reports good compliance with treatment. He is not having side effects.  He is not exercising. He is adherent to low salt diet.   Outside blood pressures are occasionally.  He does not smoke.  Use of agents associated with hypertension: none.   Obstructive apnea From 10/07/2020-Patient wears CPAP.       Medications: Outpatient Medications Prior to Visit  Medication Sig  . amLODipine (NORVASC) 10 MG tablet TAKE 1 TABLET EVERY DAY  . atorvastatin (LIPITOR) 40 MG tablet TAKE 1 TABLET EVERY DAY  . Canagliflozin-metFORMIN HCl (INVOKAMET) [phone] MG TABS Take 1 tablet by mouth 2 (two) times daily.  . carvedilol (COREG) 6.25 MG tablet Take 1 tablet (6.25 mg total) by mouth 2 (two) times daily.  . Cholecalciferol (VITAMIN D3) 1000 UNITS CAPS Take 2,000 Units by mouth daily.   Marland Kitchen EPINEPHRINE, ANAPHYLAXIS THERAPY AGENTS, EPIPEN 2-PAK, 0.3MG/0.3ML (Injection Device) - Historical Medication  as directed (0.3 MG/0.3ML) Active  . furosemide (LASIX) 20 MG tablet Take 1 tablet by mouth every morning.  . hydrALAZINE (APRESOLINE) 100 MG tablet Take 1 tablet (100 mg total) by mouth 2 (two) times daily.  Marland Kitchen levothyroxine (SYNTHROID) 88 MCG tablet Take 1 tablet (88 mcg total) by mouth daily.  . pioglitazone (ACTOS) 30 MG tablet Take 1 tablet (30 mg total) by mouth daily.  . cyclobenzaprine (FLEXERIL) 5 MG tablet Take 1 tablet (5 mg total) by mouth 3 (three) times daily as needed for muscle spasms.  . methylPREDNISolone (MEDROL) 4 MG TBPK tablet 6 day taper; take as directed on package instructions  . prednisoLONE acetate (PRED FORTE) 1 % ophthalmic suspension Place 1 drop into the left eye daily at 6 (six) AM. (Patient not taking: Reported on  01/05/2021)  . valACYclovir (VALTREX) 500 MG tablet Take 500 mg by mouth daily. (Patient not taking: Reported on 01/05/2021)   No facility-administered medications prior to visit.    Review of Systems  Constitutional: Negative for appetite change, chills and fever.  Respiratory: Negative for chest tightness, shortness of breath and wheezing.   Cardiovascular: Negative for chest pain and palpitations.  Gastrointestinal: Negative for abdominal pain, nausea and vomiting.        Objective    BP (!) 138/57   Pulse (!) 53   Temp 98 F (36.7 C)   Resp 16   Ht $R'5\' 9"'OR$   (1.753 m)   Wt 245 lb (111.1 kg)   BMI 36.18 kg/m  BP Readings from Last 3 Encounters:  01/05/21 (!) 138/57  11/09/20 (!) 158/73  10/07/20 133/63   Wt Readings from Last 3 Encounters:  01/05/21 245 lb (111.1 kg)  11/09/20 244 lb (110.7 kg)  10/07/20 253 lb (114.8 kg)       Physical Exam Vitals reviewed.  Constitutional:      Appearance: He is well-developed.  HENT:     Head: Normocephalic and atraumatic.     Right Ear: External ear normal.     Left Ear: External ear normal.     Nose: Nose normal.  Eyes:     General: No scleral icterus.    Conjunctiva/sclera: Conjunctivae normal.  Neck:     Thyroid: No thyromegaly.  Cardiovascular:     Rate and Rhythm: Normal rate and regular rhythm.     Heart sounds: Normal heart sounds.  Pulmonary:     Effort: Pulmonary effort is normal.     Breath sounds: Normal breath sounds.  Abdominal:     Palpations: Abdomen is soft.  Musculoskeletal:     Right lower leg: Edema present.     Left lower leg: Edema present.     Comments: 1+ lower extremity edema  Skin:    General: Skin is warm and dry.  Neurological:     General: No focal deficit present.     Mental Status: He is alert and oriented to person, place, and time.  Psychiatric:        Mood and Affect: Mood normal.        Behavior: Behavior normal.        Thought Content: Thought content normal.        Judgment: Judgment normal.       Results for orders placed or performed in visit on 01/05/21  POCT glycosylated hemoglobin (Hb A1C)  Result Value Ref Range   Hemoglobin A1C 6.9 (A) 4.0 - 5.6 %   HbA1c POC (<> result, manual entry)     HbA1c, POC (prediabetic range)     HbA1c, POC (controlled diabetic range)      Assessment & Plan     1. Type 2 diabetes mellitus with hyperglycemia, without long-term current use of insulin (HCC) Good control of diabetes with A1c stable at 6.9.  Continue to work on diet and exercise - POCT glycosylated hemoglobin (Hb A1C)  2. CAD in  native artery All risk factors treated, patient on Lipitor 40  3. Primary hypertension Controlled  4. Venous insufficiency of both lower extremities 4 does recommend  5. Obstructive sleep apnea syndrome Patient wears CPAP nightly  6. Adult hypothyroidism   7. Class 2 severe obesity due to excess calories with serious comorbidity and body mass index (BMI) of 36.0 to 36.9 in adult [facility]) With heart disease diabetes hypertension  hyperlipidemia sleep apnea  8. Other hyperlipidemia On atorvastatin  9. Primary osteoarthritis of right knee Follow-up with orthopedics  10. Cervicogenic headache Patient has small knot at the base of his mid occiput.  I do not think this is pathological.  I think the headaches he has are cervicogenic headaches.  Continue muscle relaxant as needed.  X-ray/CT head as needed needed/indicated.   No follow-ups on file.      I, [name], MD, have reviewed all documentation for this visit. The documentation on 01/05/21 for the exam, diagnosis, procedures, and orders are all accurate and complete.    Zachary Lyles Cranford Mon, MD  [facility] [phone] (phone) [phone] (fax)  Rafael Hernandez

## 2021-01-05 ENCOUNTER — Ambulatory Visit (INDEPENDENT_AMBULATORY_CARE_PROVIDER_SITE_OTHER): Payer: Medicare Other | Admitting: Family Medicine

## 2021-01-05 ENCOUNTER — Other Ambulatory Visit: Payer: Self-pay

## 2021-01-05 ENCOUNTER — Encounter: Payer: Self-pay | Admitting: Family Medicine

## 2021-01-05 VITALS — BP 138/57 | HR 53 | Temp 98.0°F | Resp 16 | Ht 69.0 in | Wt 245.0 lb

## 2021-01-05 DIAGNOSIS — G4486 Cervicogenic headache: Secondary | ICD-10-CM | POA: Diagnosis not present

## 2021-01-05 DIAGNOSIS — E1165 Type 2 diabetes mellitus with hyperglycemia: Secondary | ICD-10-CM | POA: Diagnosis not present

## 2021-01-05 DIAGNOSIS — M1711 Unilateral primary osteoarthritis, right knee: Secondary | ICD-10-CM | POA: Diagnosis not present

## 2021-01-05 DIAGNOSIS — G4733 Obstructive sleep apnea (adult) (pediatric): Secondary | ICD-10-CM

## 2021-01-05 DIAGNOSIS — I10 Essential (primary) hypertension: Secondary | ICD-10-CM

## 2021-01-05 DIAGNOSIS — Z6836 Body mass index (BMI) 36.0-36.9, adult: Secondary | ICD-10-CM

## 2021-01-05 DIAGNOSIS — E7849 Other hyperlipidemia: Secondary | ICD-10-CM

## 2021-01-05 DIAGNOSIS — E039 Hypothyroidism, unspecified: Secondary | ICD-10-CM

## 2021-01-05 DIAGNOSIS — I2510 Atherosclerotic heart disease of native coronary artery without angina pectoris: Secondary | ICD-10-CM

## 2021-01-05 DIAGNOSIS — I872 Venous insufficiency (chronic) (peripheral): Secondary | ICD-10-CM | POA: Diagnosis not present

## 2021-01-05 LAB — POCT GLYCOSYLATED HEMOGLOBIN (HGB A1C): Hemoglobin A1C: 6.9 % — AB (ref 4.0–5.6)

## 2021-01-26 NOTE — Progress Notes (Signed)
This encounter was created in error - please disregard.

## 2021-01-28 DIAGNOSIS — I2510 Atherosclerotic heart disease of native coronary artery without angina pectoris: Secondary | ICD-10-CM | POA: Diagnosis not present

## 2021-01-28 DIAGNOSIS — I872 Venous insufficiency (chronic) (peripheral): Secondary | ICD-10-CM | POA: Diagnosis not present

## 2021-01-28 DIAGNOSIS — R600 Localized edema: Secondary | ICD-10-CM | POA: Diagnosis not present

## 2021-01-28 DIAGNOSIS — I119 Hypertensive heart disease without heart failure: Secondary | ICD-10-CM | POA: Diagnosis not present

## 2021-01-28 DIAGNOSIS — I340 Nonrheumatic mitral (valve) insufficiency: Secondary | ICD-10-CM | POA: Diagnosis not present

## 2021-01-28 DIAGNOSIS — E782 Mixed hyperlipidemia: Secondary | ICD-10-CM | POA: Diagnosis not present

## 2021-01-28 DIAGNOSIS — G4733 Obstructive sleep apnea (adult) (pediatric): Secondary | ICD-10-CM | POA: Diagnosis not present

## 2021-01-28 DIAGNOSIS — I10 Essential (primary) hypertension: Secondary | ICD-10-CM | POA: Diagnosis not present

## 2021-01-28 DIAGNOSIS — I779 Disorder of arteries and arterioles, unspecified: Secondary | ICD-10-CM | POA: Diagnosis not present

## 2021-02-03 DIAGNOSIS — M1711 Unilateral primary osteoarthritis, right knee: Secondary | ICD-10-CM | POA: Diagnosis not present

## 2021-02-03 DIAGNOSIS — M1730 Unilateral post-traumatic osteoarthritis, unspecified knee: Secondary | ICD-10-CM | POA: Diagnosis not present

## 2021-02-03 DIAGNOSIS — T8484XA Pain due to internal orthopedic prosthetic devices, implants and grafts, initial encounter: Secondary | ICD-10-CM | POA: Diagnosis not present

## 2021-02-17 ENCOUNTER — Other Ambulatory Visit: Payer: Self-pay | Admitting: Family Medicine

## 2021-02-17 DIAGNOSIS — E7849 Other hyperlipidemia: Secondary | ICD-10-CM

## 2021-02-22 DIAGNOSIS — L821 Other seborrheic keratosis: Secondary | ICD-10-CM | POA: Diagnosis not present

## 2021-02-22 DIAGNOSIS — D2261 Melanocytic nevi of right upper limb, including shoulder: Secondary | ICD-10-CM | POA: Diagnosis not present

## 2021-02-22 DIAGNOSIS — Z03818 Encounter for observation for suspected exposure to other biological agents ruled out: Secondary | ICD-10-CM | POA: Diagnosis not present

## 2021-02-22 DIAGNOSIS — X32XXXA Exposure to sunlight, initial encounter: Secondary | ICD-10-CM | POA: Diagnosis not present

## 2021-02-22 DIAGNOSIS — Z85828 Personal history of other malignant neoplasm of skin: Secondary | ICD-10-CM | POA: Diagnosis not present

## 2021-02-22 DIAGNOSIS — Z20822 Contact with and (suspected) exposure to covid-19: Secondary | ICD-10-CM | POA: Diagnosis not present

## 2021-02-22 DIAGNOSIS — C44219 Basal cell carcinoma of skin of left ear and external auricular canal: Secondary | ICD-10-CM | POA: Diagnosis not present

## 2021-02-22 DIAGNOSIS — L570 Actinic keratosis: Secondary | ICD-10-CM | POA: Diagnosis not present

## 2021-02-22 DIAGNOSIS — D485 Neoplasm of uncertain behavior of skin: Secondary | ICD-10-CM | POA: Diagnosis not present

## 2021-02-22 DIAGNOSIS — D2262 Melanocytic nevi of left upper limb, including shoulder: Secondary | ICD-10-CM | POA: Diagnosis not present

## 2021-02-23 ENCOUNTER — Other Ambulatory Visit: Payer: Self-pay | Admitting: Family Medicine

## 2021-02-23 DIAGNOSIS — I10 Essential (primary) hypertension: Secondary | ICD-10-CM

## 2021-02-23 DIAGNOSIS — E039 Hypothyroidism, unspecified: Secondary | ICD-10-CM

## 2021-02-23 DIAGNOSIS — E1142 Type 2 diabetes mellitus with diabetic polyneuropathy: Secondary | ICD-10-CM

## 2021-02-24 DIAGNOSIS — Z20822 Contact with and (suspected) exposure to covid-19: Secondary | ICD-10-CM | POA: Diagnosis not present

## 2021-02-24 DIAGNOSIS — Z03818 Encounter for observation for suspected exposure to other biological agents ruled out: Secondary | ICD-10-CM | POA: Diagnosis not present

## 2021-02-26 ENCOUNTER — Encounter: Payer: Self-pay | Admitting: Family Medicine

## 2021-02-26 ENCOUNTER — Telehealth (INDEPENDENT_AMBULATORY_CARE_PROVIDER_SITE_OTHER): Payer: Medicare Other | Admitting: Family Medicine

## 2021-02-26 ENCOUNTER — Other Ambulatory Visit: Payer: Self-pay

## 2021-02-26 ENCOUNTER — Ambulatory Visit: Payer: Self-pay

## 2021-02-26 DIAGNOSIS — U071 COVID-19: Secondary | ICD-10-CM

## 2021-02-26 MED ORDER — MOLNUPIRAVIR EUA 200MG CAPSULE
4.0000 | ORAL_CAPSULE | Freq: Two times a day (BID) | ORAL | 0 refills | Status: AC
  Filled 2021-02-26: qty 40, 5d supply, fill #0

## 2021-02-26 NOTE — Progress Notes (Signed)
MyChart Video Visit    Virtual Visit via Video Note   This visit type was conducted due to national recommendations for restrictions regarding the COVID-19 Pandemic (e.g. social distancing) in an effort to limit this patient's exposure and mitigate transmission in our community. This patient is at least at moderate risk for complications without adequate follow up. This format is felt to be most appropriate for this patient at this time. Physical exam was limited by quality of the video and audio technology used for the visit.    Patient location: home Provider location: [place] involved in the visit: patient, provider  I discussed the limitations of evaluation and management by telemedicine and the availability of in person appointments. The patient expressed understanding and agreed to proceed.  Patient: Zachary Martinez   DOB: 11/06/1940   80 y.o. Male  MRN: [id number] Visit Date: 02/26/2021  Today's healthcare provider: Lavon Paganini, MD   Chief Complaint  Patient presents with  . Covid Positive    Patient reports today after testing positive at [employer] 02/24/21 with Covid-19. Patient states that symptoms have been present since 02/23/21, patient reports that he has tried  otc Vitamin C and Zinc.Patient reports today he has a severe sore throat, cough ( non-productive), dyspnea, diarrhea x4 days, insomnia, tightness in chest and back pain. Patient denies fever   Subjective    HPI HPI    Covid Positive     Additional comments: Patient reports today after testing positive at [place] 02/24/21 with Covid-19. Patient states that symptoms have been present since 02/23/21, patient reports that he has tried  otc Vitamin C and Zinc.Patient reports today he has a severe sore throat, cough ( non-productive), dyspnea, diarrhea x4 days, insomnia, tightness in chest and back pain. Patient denies fever       Last edited by Minette Headland, CMA  on 02/26/2021  9:18 AM. (History)      Wife tested positive on 4/25. Chest tightness feels like congestion across the entire anterior chst. Non-exertional.  Unable to tolerate CPAP  COVID vaccinated x3   Chloraseptic spray not helping   Patient Active Problem List   Diagnosis Date Noted  . Bradycardia 10/30/2018  . Venous insufficiency of both lower extremities 06/02/2017  . Hypertension 06/07/2016  . Type 2 diabetes mellitus ([place]) 10/08/2015  . Abnormal chest x-ray 05/09/2015  . CAD in native artery 05/09/2015  . Benign fibroma of prostate 05/09/2015  . Arteriosclerosis of coronary artery 05/09/2015  . Diabetes mellitus with polyneuropathy ([place]) 05/09/2015  . Diverticulosis of colon 05/09/2015  . ED (erectile dysfunction) of organic origin 05/09/2015  . Family history of colon cancer 05/09/2015  . HLD (hyperlipidemia) 05/09/2015  . Adult hypothyroidism 05/09/2015  . Adiposity 05/09/2015  . Arthritis, degenerative 05/09/2015  . Parathyroid adenoma 05/09/2015  . Apnea, sleep 05/09/2015  . Disorder of bursae and tendons in shoulder region 12/22/2014  . Hypertensive left ventricular hypertrophy 11/24/2014  . MI (mitral incompetence) 11/24/2014  . Obstructive apnea 11/24/2014   Past Medical History:  Diagnosis Date  . Arthritis    knees   . CAD (coronary artery disease)   . Cataract    removed right eye. left eye forming   . Diabetes ([place])   . Diverticulosis   . Hyperlipidemia   . Hypertension   . Hypothyroidism   . Neuromuscular disorder (HCC)    neuropathy  . Sleep apnea    cpap q night   Allergies  Allergen Reactions  . Ace Inhibitors Anaphylaxis and Other (See Comments)      Medications: Outpatient Medications Prior to Visit  Medication Sig  . amLODipine (NORVASC) 10 MG tablet TAKE 1 TABLET EVERY DAY  . atorvastatin (LIPITOR) 40 MG tablet TAKE 1 TABLET EVERY DAY  . Calcium Carb-Cholecalciferol (OYSTER SHELL CALCIUM) 500-400 MG-UNIT TABS Take by mouth.  .  Canagliflozin-metFORMIN HCl (INVOKAMET) [phone] MG TABS Take 1 tablet by mouth 2 (two) times daily.  . carvedilol (COREG) 6.25 MG tablet TAKE 1 TABLET TWICE DAILY  . Cholecalciferol (VITAMIN D3) 1000 UNITS CAPS Take 2,000 Units by mouth daily.   . cyclobenzaprine (FLEXERIL) 5 MG tablet Take 1 tablet (5 mg total) by mouth 3 (three) times daily as needed for muscle spasms.  Marland Kitchen EPINEPHRINE, ANAPHYLAXIS THERAPY AGENTS, EPIPEN 2-PAK, 0.3MG /0.3ML (Injection Device) - Historical Medication  as directed (0.3 MG/0.3ML) Active  . furosemide (LASIX) 20 MG tablet Take 1 tablet by mouth every morning.  . hydrALAZINE (APRESOLINE) 100 MG tablet TAKE 1 TABLET TWICE DAILY  . levothyroxine (SYNTHROID) 88 MCG tablet TAKE 1 TABLET EVERY DAY  . methylPREDNISolone (MEDROL) 4 MG TBPK tablet 6 day taper; take as directed on package instructions  . Omega-3 Fatty Acids (FISH OIL) 1000 MG CAPS Take 1 tablet by mouth daily.  . pioglitazone (ACTOS) 30 MG tablet TAKE 1 TABLET EVERY DAY  . prednisoLONE acetate (PRED FORTE) 1 % ophthalmic suspension Place 1 drop into the left eye daily at 6 (six) AM.  . valACYclovir (VALTREX) 500 MG tablet Take 500 mg by mouth daily.   No facility-administered medications prior to visit.    Review of Systems - per HPI    Objective    There were no vitals taken for this visit.    Physical Exam Constitutional:      General: He is not in acute distress.    Appearance: Normal appearance.  HENT:     Head: Normocephalic.  Pulmonary:     Effort: Pulmonary effort is normal.  Neurological:     Mental Status: He is alert and oriented to person, place, and time.        Assessment & Plan     1. COVID-19 - Symptoms present since 4/26 and positive test on 4/27 - Patient is at risk for progression to severe symptoms and at risk for hospitalization given his advanced age, diabetes, and other comorbidities - Options for treatment discussed with patient including IV remdesivir,  monoclonal antibodies, oral antivirals - Patient opts for molnupiravir - sent to [place] - discussed possible side effects -Also discussed symptomatic management with OTC medications - Discussed strict emergent and return precautions - Discussed isolation   Meds ordered this encounter  Medications  . molnupiravir EUA 200 mg CAPS    Sig: Take 4 capsules (800 mg total) by mouth 2 (two) times daily for 5 days.    Dispense:  40 capsule    Refill:  0     Return if symptoms worsen or fail to improve.     I discussed the assessment and treatment plan with the patient. The patient was provided an opportunity to ask questions and all were answered. The patient agreed with the plan and demonstrated an understanding of the instructions.   The patient was advised to call back or seek an in-person evaluation if the symptoms worsen or if the condition fails to improve as anticipated.  I, Lavon Paganini, MD, have reviewed all documentation for this visit. The documentation on 02/26/21  for the exam, diagnosis, procedures, and orders are all accurate and complete.   Chriss Redel, Dionne Bucy, MD, MPH [facility]

## 2021-02-26 NOTE — Patient Instructions (Signed)

## 2021-02-26 NOTE — Telephone Encounter (Signed)
Pt. Had PCR Wed. That was positive for [place]. No fever. Taking Vitamins, not helping. Spoke with Mickel Baas in the practice. Virtual visit today.  Answer Assessment - Initial Assessment Questions 1. COVID-19 DIAGNOSIS: "Who made your COVID-19 diagnosis?" "Was it confirmed by a positive lab test or self-test?" If not diagnosed by a doctor (or NP/PA), ask "Are there lots of cases (community spread) where you live?" Note: See public health department website, if unsure.     PCR 2. COVID-19 EXPOSURE: "Was there any known exposure to COVID before the symptoms began?" CDC Definition of close contact: within 6 feet (2 meters) for a total of 15 minutes or more over a 24-hour period.      Yes 3. ONSET: "When did the COVID-19 symptoms start?"      Monday 4. WORST SYMPTOM: "What is your worst symptom?" (e.g., cough, fever, shortness of breath, muscle aches)     Sore throat, congestion 5. COUGH: "Do you have a cough?" If Yes, ask: "How bad is the cough?"       Yes 6. FEVER: "Do you have a fever?" If Yes, ask: "What is your temperature, how was it measured, and when did it start?"     No 7. RESPIRATORY STATUS: "Describe your breathing?" (e.g., shortness of breath, wheezing, unable to speak)      Yes 8. BETTER-SAME-WORSE: "Are you getting better, staying the same or getting worse compared to yesterday?"  If getting worse, ask, "In what way?"     Worse 9. HIGH RISK DISEASE: "Do you have any chronic medical problems?" (e.g., asthma, heart or lung disease, weak immune system, obesity, etc.)     Yes 10. VACCINE: "Have you had the COVID-19 vaccine?" If Yes, ask: "Which one, how many shots, when did you get it?"        11. BOOSTER: "Have you received your COVID-19 booster?" If Yes, ask: "Which one and when did you get it?"        12. PREGNANCY: "Is there any chance you are pregnant?" "When was your last menstrual period?"       n/a 13. OTHER SYMPTOMS: "Do you have any other  symptoms?"  (e.g., chills, fatigue, headache, loss of smell or taste, muscle pain, sore throat)        14. O2 SATURATION MONITOR:  "Do you use an oxygen saturation monitor (pulse oximeter) at home?" If Yes, ask "What is your reading (oxygen level) today?" "What is your usual oxygen saturation reading?" (e.g., 95%)       No  Protocols used: CORONAVIRUS (COVID-19) DIAGNOSED OR SUSPECTED-A-AH

## 2021-03-05 ENCOUNTER — Other Ambulatory Visit: Payer: Self-pay | Admitting: Orthopedic Surgery

## 2021-03-24 ENCOUNTER — Other Ambulatory Visit: Payer: Self-pay

## 2021-03-24 ENCOUNTER — Encounter
Admission: RE | Admit: 2021-03-24 | Discharge: 2021-03-24 | Disposition: A | Discharge status: Home / Self Care | Payer: Medicare Other | Source: Ambulatory Visit | Attending: Orthopedic Surgery | Admitting: Orthopedic Surgery

## 2021-03-24 NOTE — Pre-Procedure Instructions (Signed)
Patient tested positive for Covid 19 on 02/24/21 with symptoms starting 02/23/21. Patient was placed on oral antivirals by PCP. Patient states he notified staff at Dr Theodore Demark office of his recent infection and was told it would be OK to proceed with surgery at this time.

## 2021-03-24 NOTE — Patient Instructions (Signed)
Your procedure is scheduled on: 04/01/21 Report to [facility]. To find out your arrival time please call [phone] between 1PM - 3PM on 03/31/21.  Remember: Instructions that are not followed completely may result in serious medical risk, up to and including death, or upon the discretion of your surgeon and anesthesiologist your surgery may need to be rescheduled.     _X__ 1. Do not eat food after midnight the night before your procedure.                 No gum chewing or hard candies. You may have clear liquids up until 2 hours before arriving the day of surgery.                 Diabetics water only  __X__2.  On the morning of surgery brush your teeth with toothpaste and water, you                 may rinse your mouth with mouthwash if you wish.  Do not swallow any              toothpaste of mouthwash.     _X__ 3.  No Alcohol for 24 hours before or after surgery.   _X__ 4.  Do Not Smoke or use e-cigarettes For 24 Hours Prior to Your Surgery.                 Do not use any chewable tobacco products for at least 6 hours prior to                 surgery.  ____  5.  Bring all medications with you on the day of surgery if instructed.   __X__  6.  Notify your doctor if there is any change in your medical condition      (cold, fever, infections).     Do not wear jewelry, make-up, hairpins, clips or nail polish. Do not wear lotions, powders, or perfumes.  Do not shave 48 hours prior to surgery. Men may shave face and neck. Do not bring valuables to the hospital.    [facility] is not responsible for any belongings or valuables.  Contacts, dentures/partials or body piercings may not be worn into surgery. Bring a case for your contacts, glasses or hearing aids, a denture cup will be supplied. Leave your suitcase in the car. After surgery it may be brought to your room. For patients admitted to the hospital, discharge time is determined by  your treatment team.   Patients discharged the day of surgery will not be allowed to drive home.   Please read over the following fact sheets that you were given:   CHG SOAP, [place]  __X__ Take these medicines the morning of surgery with A SIP OF WATER:    1. amLODipine (NORVASC) 10 MG tablet  2. carvedilol (COREG) 6.25 MG tablet  3. hydrALAZINE (APRESOLINE) 100 MG tablet  4. levothyroxine (SYNTHROID) 88 MCG tablet  5.  6.  ____ Fleet Enema (as directed)   __X__ Use CHG Soap/SAGE wipes as directed  ____ Use inhalers on the day of surgery  __X__ Stop INVOKAMET (Canagliflozin-metFORMIN) ( 2 days prior to surgery    ____ Take 1/2 of usual insulin dose the night before surgery. No insulin the morning          of surgery.   ____ Stop Blood Thinners Coumadin/Plavix/Xarelto/Pleta/Pradaxa/Eliquis/Effient/Aspirin  on   Or  contact your Surgeon, Cardiologist or Medical Doctor regarding  ability to stop your blood thinners  __X__ Stop Anti-inflammatories 7 days before surgery such as Advil, Ibuprofen, Motrin,  BC or Goodies Powder, Naprosyn, Naproxen, Aleve, Aspirin    __X__ Stop all herbal supplements, fish oil or vitamin E until after surgery.  STOP Omega-3 Fatty Acids (FISH OIL) 1 WEEK PRIOR TO SURGERY  __X__ Bring C-Pap to the hospital.

## 2021-03-25 ENCOUNTER — Encounter
Admission: RE | Admit: 2021-03-25 | Discharge: 2021-03-25 | Disposition: A | Discharge status: Home / Self Care | Payer: Medicare Other | Source: Ambulatory Visit | Attending: Orthopedic Surgery | Admitting: Orthopedic Surgery

## 2021-03-25 DIAGNOSIS — E119 Type 2 diabetes mellitus without complications: Secondary | ICD-10-CM | POA: Diagnosis not present

## 2021-03-25 DIAGNOSIS — Z01818 Encounter for other preprocedural examination: Secondary | ICD-10-CM | POA: Insufficient documentation

## 2021-03-25 DIAGNOSIS — I10 Essential (primary) hypertension: Secondary | ICD-10-CM | POA: Diagnosis not present

## 2021-03-25 LAB — BASIC METABOLIC PANEL
Anion gap: 7 (ref 5–15)
BUN: 15 mg/dL (ref 8–23)
CO2: 25 mmol/L (ref 22–32)
Calcium: 10.4 mg/dL — ABNORMAL HIGH (ref 8.9–10.3)
Chloride: 108 mmol/L (ref 98–111)
Creatinine, Ser: 0.82 mg/dL (ref 0.61–1.24)
GFR, Estimated: >60 mL/min (ref >60)
Glucose, Bld: 154 mg/dL — ABNORMAL HIGH (ref 70–99)
Potassium: 4.2 mmol/L (ref 3.5–5.1)
Sodium: 140 mmol/L (ref 135–145)

## 2021-04-01 ENCOUNTER — Ambulatory Visit: Payer: Medicare Other | Admitting: Urgent Care

## 2021-04-01 ENCOUNTER — Other Ambulatory Visit: Payer: Self-pay

## 2021-04-01 ENCOUNTER — Encounter
Admission: RE | Disposition: A | Discharge status: Home / Self Care | Payer: Self-pay | Source: Home / Self Care | Attending: Orthopedic Surgery

## 2021-04-01 ENCOUNTER — Encounter: Payer: Self-pay | Admitting: Orthopedic Surgery

## 2021-04-01 ENCOUNTER — Ambulatory Visit
Admission: RE | Admit: 2021-04-01 | Discharge: 2021-04-01 | Disposition: A | Discharge status: Home / Self Care | Payer: Medicare Other | Attending: Orthopedic Surgery | Admitting: Orthopedic Surgery

## 2021-04-01 ENCOUNTER — Ambulatory Visit: Payer: Medicare Other

## 2021-04-01 DIAGNOSIS — Y792 Prosthetic and other implants, materials and accessory orthopedic devices associated with adverse incidents: Secondary | ICD-10-CM | POA: Diagnosis not present

## 2021-04-01 DIAGNOSIS — T8484XA Pain due to internal orthopedic prosthetic devices, implants and grafts, initial encounter: Secondary | ICD-10-CM | POA: Diagnosis not present

## 2021-04-01 DIAGNOSIS — Z419 Encounter for procedure for purposes other than remedying health state, unspecified: Secondary | ICD-10-CM

## 2021-04-01 DIAGNOSIS — Z886 Allergy status to analgesic agent status: Secondary | ICD-10-CM | POA: Diagnosis not present

## 2021-04-01 DIAGNOSIS — Z79890 Hormone replacement therapy (postmenopausal): Secondary | ICD-10-CM | POA: Insufficient documentation

## 2021-04-01 DIAGNOSIS — Z79899 Other long term (current) drug therapy: Secondary | ICD-10-CM | POA: Insufficient documentation

## 2021-04-01 DIAGNOSIS — Z7984 Long term (current) use of oral hypoglycemic drugs: Secondary | ICD-10-CM | POA: Insufficient documentation

## 2021-04-01 DIAGNOSIS — M1732 Unilateral post-traumatic osteoarthritis, left knee: Secondary | ICD-10-CM | POA: Diagnosis not present

## 2021-04-01 DIAGNOSIS — E039 Hypothyroidism, unspecified: Secondary | ICD-10-CM | POA: Diagnosis not present

## 2021-04-01 DIAGNOSIS — Z472 Encounter for removal of internal fixation device: Secondary | ICD-10-CM | POA: Diagnosis not present

## 2021-04-01 HISTORY — PX: HARDWARE REMOVAL: SHX979

## 2021-04-01 LAB — GLUCOSE, CAPILLARY
Glucose-Capillary: 146 mg/dL — ABNORMAL HIGH (ref 70–99)
Glucose-Capillary: 118 mg/dL — ABNORMAL HIGH (ref 70–99)

## 2021-04-01 SURGERY — REMOVAL, HARDWARE
Anesthesia: General | Laterality: Left

## 2021-04-01 MED ORDER — ONDANSETRON HCL 4 MG PO TABS: 4.0000 mg | ORAL_TABLET | Freq: Four times a day (QID) | ORAL | Status: DC | PRN

## 2021-04-01 MED ORDER — FENTANYL CITRATE (PF) 100 MCG/2ML IJ SOLN
INTRAMUSCULAR | Status: AC
  Filled 2021-04-01: qty 2

## 2021-04-01 MED ORDER — ONDANSETRON HCL 4 MG/2ML IJ SOLN
INTRAMUSCULAR | Status: DC | PRN
  Administered 2021-04-01: 4 mg via INTRAVENOUS

## 2021-04-01 MED ORDER — FENTANYL CITRATE (PF) 100 MCG/2ML IJ SOLN
INTRAMUSCULAR | Status: DC | PRN
  Administered 2021-04-01 (×2): 25 ug via INTRAVENOUS
  Administered 2021-04-01 (×3): 50 ug via INTRAVENOUS

## 2021-04-01 MED ORDER — LIDOCAINE HCL (CARDIAC) PF 100 MG/5ML IV SOSY
PREFILLED_SYRINGE | INTRAVENOUS | Status: DC | PRN
  Administered 2021-04-01: 60 mg via INTRAVENOUS

## 2021-04-01 MED ORDER — FAMOTIDINE 20 MG PO TABS
20.0000 mg | ORAL_TABLET | Freq: Once | ORAL | Status: AC
  Administered 2021-04-01: 20 mg via ORAL

## 2021-04-01 MED ORDER — ONDANSETRON HCL 4 MG/2ML IJ SOLN
INTRAMUSCULAR | Status: AC
  Filled 2021-04-01: qty 2

## 2021-04-01 MED ORDER — CEFAZOLIN SODIUM-DEXTROSE 2-4 GM/100ML-% IV SOLN
2.0000 g | INTRAVENOUS | Status: AC
  Administered 2021-04-01: 2 g via INTRAVENOUS

## 2021-04-01 MED ORDER — CHLORHEXIDINE GLUCONATE 0.12 % MT SOLN
15.0000 mL | Freq: Once | OROMUCOSAL | Status: AC
  Administered 2021-04-01: 15 mL via OROMUCOSAL

## 2021-04-01 MED ORDER — GLYCOPYRROLATE 0.2 MG/ML IJ SOLN
INTRAMUSCULAR | Status: DC | PRN
  Administered 2021-04-01: .2 mg via INTRAVENOUS

## 2021-04-01 MED ORDER — NEOMYCIN-POLYMYXIN B GU 40-200000 IR SOLN
Status: DC | PRN
  Administered 2021-04-01: 4 mL

## 2021-04-01 MED ORDER — SODIUM CHLORIDE 0.9 % IV SOLN: INTRAVENOUS | Status: DC

## 2021-04-01 MED ORDER — HYDROCODONE-ACETAMINOPHEN 5-325 MG PO TABS: 1.0000 | ORAL_TABLET | Freq: Four times a day (QID) | ORAL | 0 refills | Status: DC | PRN

## 2021-04-01 MED ORDER — FAMOTIDINE 20 MG PO TABS
ORAL_TABLET | ORAL | Status: AC
  Filled 2021-04-01: qty 1

## 2021-04-01 MED ORDER — CHLORHEXIDINE GLUCONATE 0.12 % MT SOLN
OROMUCOSAL | Status: AC
  Filled 2021-04-01: qty 15

## 2021-04-01 MED ORDER — EPHEDRINE 5 MG/ML INJ
INTRAVENOUS | Status: AC
  Filled 2021-04-01: qty 10

## 2021-04-01 MED ORDER — PROPOFOL 10 MG/ML IV BOLUS
INTRAVENOUS | Status: AC
  Filled 2021-04-01: qty 20

## 2021-04-01 MED ORDER — ORAL CARE MOUTH RINSE: 15.0000 mL | Freq: Once | OROMUCOSAL | Status: AC

## 2021-04-01 MED ORDER — SODIUM CHLORIDE 0.9 % IV SOLN
INTRAVENOUS | Status: DC | PRN
  Administered 2021-04-01: 20 ug/min via INTRAVENOUS

## 2021-04-01 MED ORDER — FENTANYL CITRATE (PF) 100 MCG/2ML IJ SOLN
INTRAMUSCULAR | Status: AC
  Administered 2021-04-01: 25 ug via INTRAVENOUS
  Filled 2021-04-01: qty 2

## 2021-04-01 MED ORDER — METOCLOPRAMIDE HCL 5 MG/ML IJ SOLN: 5.0000 mg | Freq: Three times a day (TID) | INTRAMUSCULAR | Status: DC | PRN

## 2021-04-01 MED ORDER — HYDROCODONE-ACETAMINOPHEN 5-325 MG PO TABS
1.0000 | ORAL_TABLET | Freq: Once | ORAL | Status: AC
Start: 2021-04-01 — End: 2021-04-01
  Administered 2021-04-01: 1 via ORAL

## 2021-04-01 MED ORDER — ONDANSETRON HCL 4 MG/2ML IJ SOLN
4.0000 mg | Freq: Once | INTRAMUSCULAR | Status: AC | PRN
  Administered 2021-04-01: 4 mg via INTRAVENOUS

## 2021-04-01 MED ORDER — DEXAMETHASONE SODIUM PHOSPHATE 10 MG/ML IJ SOLN
INTRAMUSCULAR | Status: AC
  Filled 2021-04-01: qty 1

## 2021-04-01 MED ORDER — CEFAZOLIN SODIUM-DEXTROSE 2-4 GM/100ML-% IV SOLN
INTRAVENOUS | Status: AC
  Filled 2021-04-01: qty 100

## 2021-04-01 MED ORDER — ONDANSETRON HCL 4 MG/2ML IJ SOLN: 4.0000 mg | Freq: Four times a day (QID) | INTRAMUSCULAR | Status: DC | PRN

## 2021-04-01 MED ORDER — FENTANYL CITRATE (PF) 100 MCG/2ML IJ SOLN
25.0000 ug | INTRAMUSCULAR | Status: DC | PRN
  Administered 2021-04-01 (×4): 25 ug via INTRAVENOUS

## 2021-04-01 MED ORDER — HYDROCODONE-ACETAMINOPHEN 5-325 MG PO TABS
ORAL_TABLET | ORAL | Status: AC
  Filled 2021-04-01: qty 1

## 2021-04-01 MED ORDER — PROPOFOL 10 MG/ML IV BOLUS
INTRAVENOUS | Status: DC | PRN
  Administered 2021-04-01: 170 mg via INTRAVENOUS

## 2021-04-01 MED ORDER — GLYCOPYRROLATE 0.2 MG/ML IJ SOLN
INTRAMUSCULAR | Status: AC
  Filled 2021-04-01: qty 1

## 2021-04-01 MED ORDER — METOCLOPRAMIDE HCL 10 MG PO TABS: 5.0000 mg | ORAL_TABLET | Freq: Three times a day (TID) | ORAL | Status: DC | PRN

## 2021-04-01 MED ORDER — EPHEDRINE SULFATE 50 MG/ML IJ SOLN
INTRAMUSCULAR | Status: DC | PRN
  Administered 2021-04-01: 5 mg via INTRAVENOUS
  Administered 2021-04-01: 7.5 mg via INTRAVENOUS
  Administered 2021-04-01: 12.5 mg via INTRAVENOUS

## 2021-04-01 MED ORDER — PHENYLEPHRINE HCL (PRESSORS) 10 MG/ML IV SOLN
INTRAVENOUS | Status: DC | PRN
  Administered 2021-04-01 (×2): 100 ug via INTRAVENOUS

## 2021-04-01 MED ORDER — NEOMYCIN-POLYMYXIN B GU 40-200000 IR SOLN
Status: AC
  Filled 2021-04-01: qty 4

## 2021-04-01 SURGICAL SUPPLY — 39 items
APL PRP STRL LF DISP 70% ISPRP (MISCELLANEOUS) ×1
BIT DRILL 2.9 (BIT) ×2 IMPLANT
CHLORAPREP W/TINT 26 (MISCELLANEOUS) ×2 IMPLANT
COVER WAND RF STERILE (DRAPES) ×2 IMPLANT
CUFF TOURN SGL QUICK 24 (TOURNIQUET CUFF)
CUFF TOURN SGL QUICK 34 (TOURNIQUET CUFF) ×2
CUFF TRNQT CYL 24X4X16.5-23 (TOURNIQUET CUFF) IMPLANT
CUFF TRNQT CYL 34X4.125X (TOURNIQUET CUFF) ×1 IMPLANT
DRAPE C-ARM XRAY 36X54 (DRAPES) ×2 IMPLANT
DRAPE INCISE IOBAN 66X45 STRL (DRAPES) ×2 IMPLANT
DRSG EMULSION OIL 3X8 NADH (GAUZE/BANDAGES/DRESSINGS) IMPLANT
ELECT CAUTERY BLADE 6.4 (BLADE) ×2 IMPLANT
ELECT REM PT RETURN 9FT ADLT (ELECTROSURGICAL) ×2
ELECTRODE REM PT RTRN 9FT ADLT (ELECTROSURGICAL) ×1 IMPLANT
GAUZE SPONGE 4X4 12PLY STRL (GAUZE/BANDAGES/DRESSINGS) ×2 IMPLANT
GAUZE XEROFORM 1X8 LF (GAUZE/BANDAGES/DRESSINGS) IMPLANT
GLOVE SURG SYN 9.0  PF PI (GLOVE) ×1
GLOVE SURG SYN 9.0 PF PI (GLOVE) ×1 IMPLANT
GOWN SRG 2XL LVL 4 RGLN SLV (GOWNS) ×1 IMPLANT
GOWN STRL NON-REIN 2XL LVL4 (GOWNS) ×2
GOWN STRL REUS W/ TWL LRG LVL3 (GOWN DISPOSABLE) ×1 IMPLANT
GOWN STRL REUS W/TWL LRG LVL3 (GOWN DISPOSABLE) ×2
KIT TURNOVER KIT A (KITS) ×2 IMPLANT
MANIFOLD NEPTUNE II (INSTRUMENTS) IMPLANT
NEEDLE FILTER BLUNT 18X 1/2SAF (NEEDLE) ×1
NEEDLE FILTER BLUNT 18X1 1/2 (NEEDLE) ×1 IMPLANT
NS IRRIG 1000ML POUR BTL (IV SOLUTION) ×2 IMPLANT
PACK EXTREMITY ARMC (MISCELLANEOUS) ×2 IMPLANT
PAD ABD DERMACEA PRESS 5X9 (GAUZE/BANDAGES/DRESSINGS) IMPLANT
SCALPEL PROTECTED #15 DISP (BLADE) ×4 IMPLANT
STAPLER SKIN PROX 35W (STAPLE) ×2 IMPLANT
SUT ETHIBOND NAB CT1 #1 30IN (SUTURE) IMPLANT
SUT ETHILON 3-0 FS-10 30 BLK (SUTURE)
SUT VIC AB 0 CT1 36 (SUTURE) IMPLANT
SUT VIC AB 2-0 CT1 27 (SUTURE) ×2
SUT VIC AB 2-0 CT1 TAPERPNT 27 (SUTURE) ×1 IMPLANT
SUTURE EHLN 3-0 FS-10 30 BLK (SUTURE) IMPLANT
SYR 10ML LL (SYRINGE) ×2 IMPLANT
WATER STERILE IRR 1000ML POUR (IV SOLUTION) IMPLANT

## 2021-04-01 NOTE — Anesthesia Procedure Notes (Signed)
Procedure Name: LMA Insertion Date/Time: 04/01/2021 3:11 PM Performed by: Bea Graff, RN Pre-anesthesia Checklist: Patient identified, Emergency Drugs available, Suction available, Patient being monitored and Timeout performed Patient Re-evaluated:Patient Re-evaluated prior to induction Oxygen Delivery Method: Circle system utilized Preoxygenation: Pre-oxygenation with 100% oxygen Induction Type: IV induction LMA: LMA inserted LMA Size: 4.5 Number of attempts: 1

## 2021-04-01 NOTE — H&P (Signed)
Chief Complaint  Patient presents with  . Pre-op Exam  Screw removal from left proximal tibia 04/01/21 by Dr. Rudene Christians    History of the Present Illness: Zachary Martinez is a 80 y.o. male here today for history and physical for left knee hardware proximal screw removal. Patient has posttraumatic osteoarthritis of the left knee. Patient has hardware that would interfere with total knee arthroplasty. Patient has seen Dr. Rudene Christians who is recommended removal of proximal screws to the left proximal tibia hardware. Patient has bilateral knee pain, left knee along the lateral joint line. Patient's left tibia ORIF was performed 10 years ago. No previous infections  The patient states his health has been good other than his knees. He tells me he had a physical at the [facility].  The patient takes care of his wife. He mows his yard, the church yard, and a neighbor's yard.   I have reviewed past medical, surgical, social and family history, and allergies as documented in the EMR.  Past Medical History: Past Medical History:  Diagnosis Date  . CAD (coronary artery disease)  . Diabetes mellitus type 2, uncomplicated (CMS-HCC)  . Hyperlipidemia  . Hypertension  . Sleep apnea   Past Surgical History: Past Surgical History:  Procedure Laterality Date  . ADENOIDECTOMY  . Arthroscopic superior labrum anterior and posterior repair, arthroscopic debridement, arthroscopic subacromial decompression, mini open rotator cuff repair, and mini open biceps tenodesis, right shoulder Right 01/01/15  . CYSTECTOMY  . femoral condyle  . KNEE ARTHROSCOPY 02/29/2008  Right  . KNEE ARTHROSCOPY 04/20/2012  left  . Left lateral tibial plateau fracture arthroscopy with open reduction and internal fixation  . Sleep apnea surgery  . TONSILLECTOMY   Past Family History: Family History  Problem Relation Age of Onset  . Colon cancer Other  . Myocardial Infarction (Heart attack) Father   Medications: Current  Outpatient Medications Ordered in Epic  Medication Sig Dispense Refill  . amLODIPine (NORVASC) 10 MG tablet Take 10 mg by mouth once daily.  Marland Kitchen atorvastatin (LIPITOR) 40 MG tablet Take 40 mg by mouth once daily.  . calcium carbonate-vitamin D3 (OS-CAL 500+D) 500 mg-10 mcg (400 unit) tablet Take 1 tablet by mouth 2 (two) times daily with meals  . canagliflozin-metformin 150-1,000 mg Tab Take 1 tablet by mouth 2 (two) times daily  . carvedilol (COREG) 6.25 MG tablet Take 6.25 mg by mouth 2 (two) times daily with meals.  . cyclobenzaprine (FLEXERIL) 5 MG tablet Take 5 mg by mouth 3 (three) times daily as needed  . docosahexaenoic acid-epa (FISH OIL) 120-180 mg Cap Take 1 tablet by mouth once daily  . hydrALAZINE (APRESOLINE) 100 MG tablet Take 1 tablet (100 mg total) by mouth 3 (three) times daily (Patient taking differently: Take 100 mg by mouth 2 (two) times daily ) 270 tablet 4  . levothyroxine (SYNTHROID, LEVOTHROID) 88 MCG tablet Take 88 mcg by mouth once daily. Take on an empty stomach with a glass of water at least 30-60 minutes before breakfast.  . pioglitazone (ACTOS) 30 MG tablet Take 30 mg by mouth once daily.  . prednisoLONE acetate (PRED FORTE) 1 % ophthalmic suspension Apply to eye as directed (Patient not taking: Reported on 02/03/2021 )   No current Epic-ordered facility-administered medications on file.   Allergies: Allergies  Allergen Reactions  . Ace Inhibitors Anaphylaxis    Body mass index is 36.18 kg/m.  Review of Systems: A comprehensive 14 point ROS was performed, reviewed, and the pertinent orthopaedic  findings are documented in the HPI.  Vitals:  03/30/21 1512  BP: (!) 140/78    General Physical Examination:   General:  Well developed, well nourished, no apparent distress, normal affect, normal gait with no antalgic component.   HEENT: Head normocephalic, atraumatic, PERRL.   Abdomen: Soft, non tender, non distended, Bowel sounds  present.  Heart: Examination of the heart reveals regular, rate, and rhythm. There is no murmur noted on ascultation. There is a normal apical pulse.  Lungs: Lungs are clear to auscultation. There is no wheeze, rhonchi, or crackles. There is normal expansion of bilateral chest walls.   Musculoskeletal Examination:  On exam, left knee 5 to 95 degrees range of motion with slight valgus deformity antalgia gait with no assistive vices. No effusion warmth or redness. Incision site from previous proximal tibia ORIF is completely healed. Minimal pitting edema in the left ankle.  Radiographs:  X-rays of the left knee reviewed by me today from 09/14/2020 shows patient has valgus deformity to the left knee with proximal tibia hardware along the lateral tibial plateau with numerous screws present along the proximal tibia metaphysis and metadiaphysis.  Assessment: ICD-10-CM  1. Painful orthopaedic hardware (CMS-HCC) T84.84XA  2. Post-traumatic osteoarthritis of left knee M17.30   Plan: 98. 80 year old male with severe posttraumatic osteoarthritis of the left knee. He has hardware from previous proximal tibia fracture with ORIF. Patient ultimately needs total knee arthroplasty but we will need to remove proximal tibial hardware first and allow this incision to heal before proceeding with total knee arthroplasty. Risks, benefits, complications of a left knee proximal tibial hardware removal have been discussed with the patient. Patient has agreed and consented the procedure with Dr. Hessie Knows on 04/01/2021   Electronically signed by Feliberto Gottron, [place] at 03/30/2021 3:37 PM EDT  Reviewed  H+P. No changes noted.

## 2021-04-01 NOTE — Transfer of Care (Signed)
Immediate Anesthesia Transfer of Care Note  Patient: Zachary Martinez  Procedure(s) Performed: Screw removal from left proximal tibia (Left )  Patient Location: PACU  Anesthesia Type:General  Level of Consciousness: drowsy  Airway & Oxygen Therapy: Patient Spontanous Breathing and Patient connected to face mask oxygen  Post-op Assessment: Report given to RN  Post vital signs: stable  Last Vitals:  Vitals Value Taken Time  BP 159/81 04/01/21 1711  Temp 36.5 C 04/01/21 1711  Pulse 69 04/01/21 1715  Resp 17 04/01/21 1715  SpO2 99 % 04/01/21 1715  Vitals shown include unvalidated device data.  Last Pain: There were no vitals filed for this visit.       Complications: No complications documented.

## 2021-04-01 NOTE — Anesthesia Postprocedure Evaluation (Signed)
Anesthesia Post Note  Patient: Zachary Martinez  Procedure(s) Performed: Screw removal from left proximal tibia (Left )  Patient location during evaluation: PACU Anesthesia Type: General Level of consciousness: awake and alert Pain management: pain level controlled Vital Signs Assessment: post-procedure vital signs reviewed and stable Respiratory status: spontaneous breathing and respiratory function stable Cardiovascular status: stable Anesthetic complications: no   No complications documented.   Last Vitals:  Vitals:   04/01/21 1735 04/01/21 1740  BP:    Pulse: 64 62  Resp: 13 10  Temp:    SpO2: 91% 92%    Last Pain:  Vitals:   04/01/21 1740  PainSc: 7                  Avnoor Koury K

## 2021-04-01 NOTE — Discharge Instructions (Addendum)
Weightbearing as tolerated on left leg. Keep dressing clean and dry until return visit. Try to minimize activities through the weekend to help skin heal. Pain medicine as directed. Call office if you are having any problems.   AMBULATORY SURGERY  DISCHARGE INSTRUCTIONS   1) The drugs that you were given will stay in your system until tomorrow so for the next 24 hours you should not:  A) Drive an automobile B) Make any legal decisions C) Drink any alcoholic beverage   2) You may resume regular meals tomorrow.  Today it is better to start with liquids and gradually work up to solid foods.  You may eat anything you prefer, but it is better to start with liquids, then soup and crackers, and gradually work up to solid foods.   3) Please notify your doctor immediately if you have any unusual bleeding, trouble breathing, redness and pain at the surgery site, drainage, fever, or pain not relieved by medication.    4) Additional Instructions:        Please contact your physician with any problems or Same Day Surgery at [phone], Monday through Friday 6 am to 4 pm, or [place] at [facility] number at [phone].

## 2021-04-01 NOTE — Anesthesia Preprocedure Evaluation (Signed)
Anesthesia Evaluation  Patient identified by MRN, date of birth, ID band Patient awake    Reviewed: Allergy & Precautions, H&P , NPO status , Patient's Chart, lab work & pertinent test results, reviewed documented beta blocker date and time   Airway Mallampati: II  TM Distance: >3 FB Neck ROM: full    Dental  (+) Teeth Intact   Pulmonary sleep apnea and Continuous Positive Airway Pressure Ventilation , former smoker,    Pulmonary exam normal        Cardiovascular Exercise Tolerance: Poor hypertension, On Medications + CAD  Normal cardiovascular exam Rate:Normal     Neuro/Psych  Neuromuscular disease negative psych ROS   GI/Hepatic negative GI ROS, Neg liver ROS,   Endo/Other  diabetesHypothyroidism   Renal/GU negative Renal ROS  negative genitourinary   Musculoskeletal   Abdominal   Peds  Hematology negative hematology ROS (+)   Anesthesia Other Findings   Reproductive/Obstetrics negative OB ROS                             Anesthesia Physical Anesthesia Plan  ASA: III  Anesthesia Plan: General LMA   Post-op Pain Management:    Induction:   PONV Risk Score and Plan:   Airway Management Planned:   Additional Equipment:   Intra-op Plan:   Post-operative Plan:   Informed Consent: I have reviewed the patients History and Physical, chart, labs and discussed the procedure including the risks, benefits and alternatives for the proposed anesthesia with the patient or authorized representative who has indicated his/her understanding and acceptance.       Plan Discussed with: CRNA  Anesthesia Plan Comments:         Anesthesia Quick Evaluation

## 2021-04-01 NOTE — Op Note (Signed)
04/01/2021  5:10 PM  PATIENT:  Zachary Martinez  80 y.o. male  PRE-OPERATIVE DIAGNOSIS:  Painful orthopaedic hardware T84.84XA  POST-OPERATIVE DIAGNOSIS:  Painful orthopaedic hardware T84.84XA  PROCEDURE:  Procedure(s): Screw removal from left proximal tibia (Left)  SURGEON: Laurene Footman, MD  ASSISTANTS: None  ANESTHESIA:   general  EBL:  Total I/O In: 1100 [I.V.:1000; IV Piggyback:100] Out: -   BLOOD ADMINISTERED:none  DRAINS: none   LOCAL MEDICATIONS USED:  NONE  SPECIMEN:  No Specimen  DISPOSITION OF SPECIMEN:  N/A  COUNTS:  YES  TOURNIQUET:  * Missing tourniquet times found for documented tourniquets in log: [id number] *  IMPLANTS: None  DICTATION: .Dragon Dictation patient brought the operating room and after adequate anesthesia was obtained the left leg was prepped and draped in the usual sterile fashion.  After patient identification and timeout procedures were completed a small incision was made over the anterior portion of the plate approximately 2 cm in length.  Periosteal elevator was used to elevate the tissue off the plate and of the 7 initial screws proximally 6 of them were star driver screws that were locking screws and these were removed first using a hand screwdriver and then power screwdriver to remove them without difficulty the most anterior and proximal screw was a square head screw and was stripped and a second screwdriver also did not remove it.  The broken screw set was sent for but was not sterile so while waiting for that to be scum sterilized 2 oblique screws were removed with a separate stab incision made to remove both of the screws without much difficulty.  After this the broken screw set was used but unfortunately the most proximal screw could not be removed however using those tools most of the head came out and so is felt that at the time of his planned total knee the screw would not be a problem and should come out with his bone cut.  The wounds were  thoroughly irrigated and closed with 2-0 Vicryl subcutaneously and skin staples Xeroform 4 x 4's web roll and Ace wrap applied.  C arm was used during the procedure to help identify screw position.  PLAN OF CARE: Discharge to home after PACU  PATIENT DISPOSITION:  PACU - hemodynamically stable.

## 2021-04-02 ENCOUNTER — Encounter: Payer: Self-pay | Admitting: Orthopedic Surgery

## 2021-04-19 ENCOUNTER — Other Ambulatory Visit: Payer: Self-pay | Admitting: Orthopedic Surgery

## 2021-04-19 DIAGNOSIS — M1730 Unilateral post-traumatic osteoarthritis, unspecified knee: Secondary | ICD-10-CM

## 2021-04-21 DIAGNOSIS — C44219 Basal cell carcinoma of skin of left ear and external auricular canal: Secondary | ICD-10-CM | POA: Diagnosis not present

## 2021-04-27 ENCOUNTER — Other Ambulatory Visit: Payer: Self-pay

## 2021-04-27 ENCOUNTER — Ambulatory Visit
Admission: RE | Admit: 2021-04-27 | Discharge: 2021-04-27 | Disposition: A | Discharge status: Home / Self Care | Payer: Medicare Other | Source: Ambulatory Visit | Attending: Orthopedic Surgery | Admitting: Orthopedic Surgery

## 2021-04-27 DIAGNOSIS — M1712 Unilateral primary osteoarthritis, left knee: Secondary | ICD-10-CM | POA: Diagnosis not present

## 2021-04-27 DIAGNOSIS — M4328 Fusion of spine, sacral and sacrococcygeal region: Secondary | ICD-10-CM | POA: Diagnosis not present

## 2021-04-27 DIAGNOSIS — Z01818 Encounter for other preprocedural examination: Secondary | ICD-10-CM | POA: Diagnosis not present

## 2021-04-27 DIAGNOSIS — M19072 Primary osteoarthritis, left ankle and foot: Secondary | ICD-10-CM | POA: Diagnosis not present

## 2021-04-27 DIAGNOSIS — M1730 Unilateral post-traumatic osteoarthritis, unspecified knee: Secondary | ICD-10-CM | POA: Insufficient documentation

## 2021-05-10 ENCOUNTER — Other Ambulatory Visit: Payer: Self-pay | Admitting: Orthopedic Surgery

## 2021-05-18 ENCOUNTER — Other Ambulatory Visit: Payer: Self-pay | Admitting: Family Medicine

## 2021-05-18 DIAGNOSIS — E7849 Other hyperlipidemia: Secondary | ICD-10-CM

## 2021-05-25 DIAGNOSIS — B0052 Herpesviral keratitis: Secondary | ICD-10-CM | POA: Diagnosis not present

## 2021-05-28 ENCOUNTER — Encounter: Payer: Self-pay | Admitting: Orthopedic Surgery

## 2021-05-28 ENCOUNTER — Other Ambulatory Visit: Payer: Self-pay

## 2021-05-28 ENCOUNTER — Encounter
Admission: RE | Admit: 2021-05-28 | Discharge: 2021-05-28 | Disposition: A | Discharge status: Home / Self Care | Payer: Medicare Other | Source: Ambulatory Visit | Attending: Orthopedic Surgery | Admitting: Orthopedic Surgery

## 2021-05-28 DIAGNOSIS — Z01812 Encounter for preprocedural laboratory examination: Secondary | ICD-10-CM | POA: Diagnosis not present

## 2021-05-28 LAB — COMPREHENSIVE METABOLIC PANEL
ALT: 18 U/L (ref 0–44)
AST: 16 U/L (ref 15–41)
Albumin: 3.7 g/dL (ref 3.5–5.0)
Alkaline Phosphatase: 73 U/L (ref 38–126)
Anion gap: 3 — ABNORMAL LOW (ref 5–15)
BUN: 19 mg/dL (ref 8–23)
CO2: 23 mmol/L (ref 22–32)
Calcium: 10.4 mg/dL — ABNORMAL HIGH (ref 8.9–10.3)
Chloride: 112 mmol/L — ABNORMAL HIGH (ref 98–111)
Creatinine, Ser: 0.71 mg/dL (ref 0.61–1.24)
GFR, Estimated: >60 mL/min (ref >60)
Glucose, Bld: 107 mg/dL — ABNORMAL HIGH (ref 70–99)
Potassium: 4.2 mmol/L (ref 3.5–5.1)
Sodium: 138 mmol/L (ref 135–145)
Total Bilirubin: 0.9 mg/dL (ref 0.3–1.2)
Total Protein: 6.3 g/dL — ABNORMAL LOW (ref 6.5–8.1)

## 2021-05-28 LAB — URINALYSIS, ROUTINE W REFLEX MICROSCOPIC
Bacteria, UA: NONE SEEN
Bilirubin Urine: NEGATIVE
Glucose, UA: >=500 mg/dL — AB
Hgb urine dipstick: NEGATIVE
Ketones, ur: NEGATIVE mg/dL
Leukocytes,Ua: NEGATIVE
Nitrite: NEGATIVE
Protein, ur: NEGATIVE mg/dL
Specific Gravity, Urine: 1.03 (ref 1.005–1.030)
Squamous Epithelial / HPF: NONE SEEN (ref 0–5)
pH: 5 (ref 5.0–8.0)

## 2021-05-28 LAB — CBC WITH DIFFERENTIAL/PLATELET
Abs Immature Granulocytes: 0.03 10*3/uL (ref 0.00–0.07)
Basophils Absolute: 0 10*3/uL (ref 0.0–0.1)
Basophils Relative: 1 %
Eosinophils Absolute: 0.1 10*3/uL (ref 0.0–0.5)
Eosinophils Relative: 2 %
HCT: 39.6 % (ref 39.0–52.0)
Hemoglobin: 13.2 g/dL (ref 13.0–17.0)
Immature Granulocytes: 1 %
Lymphocytes Relative: 23 %
Lymphs Abs: 1.1 10*3/uL (ref 0.7–4.0)
MCH: 31.5 pg (ref 26.0–34.0)
MCHC: 33.3 g/dL (ref 30.0–36.0)
MCV: 94.5 fL (ref 80.0–100.0)
Monocytes Absolute: 0.5 10*3/uL (ref 0.1–1.0)
Monocytes Relative: 10 %
Neutro Abs: 2.9 10*3/uL (ref 1.7–7.7)
Neutrophils Relative %: 63 %
Platelets: 171 10*3/uL (ref 150–400)
RBC: 4.19 MIL/uL — ABNORMAL LOW (ref 4.22–5.81)
RDW: 15 % (ref 11.5–15.5)
WBC: 4.5 10*3/uL (ref 4.0–10.5)
nRBC: 0 % (ref 0.0–0.2)

## 2021-05-28 LAB — SURGICAL PCR SCREEN
MRSA, PCR: NEGATIVE
Staphylococcus aureus: NEGATIVE

## 2021-05-28 NOTE — Patient Instructions (Addendum)
Your procedure is scheduled on:06-08-21 Tuesday Report to the Registration Desk on the 1st floor of the [facility]-Then proceed to the 2nd floor Surgery Desk in the [place] To find out your arrival time, please call [phone] between 1PM - 3PM on:06-07-21 Monday  REMEMBER: Instructions that are not followed completely may result in serious medical risk, up to and including death; or upon the discretion of your surgeon and anesthesiologist your surgery may need to be rescheduled.  Do not eat food after midnight the night before surgery.  No gum chewing, lozengers or hard candies.  You may however, drink water up to 2 hours before you are scheduled to arrive for your surgery. Do not drink anything within 2 hours of your scheduled arrival time.  Type 1 and Type 2 diabetics should only drink water.  TAKE THESE MEDICATIONS THE MORNING OF SURGERY WITH A SIP OF WATER: -Amlodipine (Norvasc) -Carvedilol (Coreg) -Hydralazine (Apresoline) -Levothyroxine (Synthroid)  Stop Canagliflozin-Metformin (Invokamet) 2 days prior to surgery-Last dose on 06-05-21 Saturday  One week prior to surgery: Stop Anti-inflammatories (NSAIDS) such as Advil, Aleve, Ibuprofen, Motrin, Naproxen, Naprosyn and Aspirin based products such as Excedrin, Goodys Powder, BC Powder.You may however, continue to take Tylenol if needed for pain up until the day of surgery.  Stop ANY OVER THE COUNTER supplements/vitamins 7 days prior to surgery (Fish Oil and Vitamin D3)  No Alcohol for 24 hours before or after surgery.  No Smoking including e-cigarettes for 24 hours prior to surgery.  No chewable tobacco products for at least 6 hours prior to surgery.  No nicotine patches on the day of surgery.  Do not use any "recreational" drugs for at least a week prior to your surgery.  Please be advised that the combination of cocaine and anesthesia may have negative outcomes, up to and including death. If you test positive for  cocaine, your surgery will be cancelled.  On the morning of surgery brush your teeth with toothpaste and water, you may rinse your mouth with mouthwash if you wish. Do not swallow any toothpaste or mouthwash.  Do not wear jewelry, make-up, hairpins, clips or nail polish.  Do not wear lotions, powders, or perfumes.   Do not shave body from the neck down 48 hours prior to surgery just in case you cut yourself which could leave a site for infection.  Also, freshly shaved skin may become irritated if using the CHG soap.  Contact lenses, hearing aids and dentures may not be worn into surgery.  Do not bring valuables to the hospital. [facility] is not responsible for any missing/lost belongings or valuables.   Use CHG Soap as directed on instruction sheet..  Bring your C-PAP to the hospital with you   Notify your doctor if there is any change in your medical condition (cold, fever, infection).  Wear comfortable clothing (specific to your surgery type) to the hospital.  After surgery, you can help prevent lung complications by doing breathing exercises.  Take deep breaths and cough every 1-2 hours. Your doctor may order a device called an Incentive Spirometer to help you take deep breaths. When coughing or sneezing, hold a pillow firmly against your incision with both hands. This is called "splinting." Doing this helps protect your incision. It also decreases belly discomfort.  If you are being admitted to the hospital overnight, leave your suitcase in the car. After surgery it may be brought to your room.  If you are being discharged the day of surgery,  you will not be allowed to drive home. You will need a responsible adult (18 years or older) to drive you home and stay with you that night.   If you are taking public transportation, you will need to have a responsible adult (18 years or older) with you. Please confirm with your physician that it is acceptable to use public  transportation.   Please call the [place] Dept. at [phone] if you have any questions about these instructions.  Surgery Visitation Policy:  Patients undergoing a surgery or procedure may have one family member or support person with them as long as that person is not COVID-19 positive or experiencing its symptoms.  That person may remain in the waiting area during the procedure.  Inpatient Visitation:    Visiting hours are 7 a.m. to 8 p.m. Inpatients will be allowed two visitors daily. The visitors may change each day during the patient's stay. No visitors under the age of 68. Any visitor under the age of 78 must be accompanied by an adult. The visitor must pass COVID-19 screenings, use hand sanitizer when entering and exiting the patient's room and wear a mask at all times, including in the patient's room. Patients must also wear a mask when staff or their visitor are in the room. Masking is required regardless of vaccination status.

## 2021-05-31 DIAGNOSIS — M170 Bilateral primary osteoarthritis of knee: Secondary | ICD-10-CM | POA: Diagnosis not present

## 2021-06-01 ENCOUNTER — Encounter: Payer: Self-pay | Admitting: Orthopedic Surgery

## 2021-06-01 NOTE — Progress Notes (Signed)
Perioperative Services  Pre-Admission/Anesthesia Testing Clinical Review  Date: 06/01/21  Patient Demographics:  Name: Zachary Martinez DOB:   1941-06-26 MRN:   [id number]  Planned Surgical Procedure(s):    Case: [id number] Date/Time: 06/08/21 0700   Procedure: TOTAL KNEE ARTHROPLASTY (Left: Knee)   Anesthesia type: Choice   Pre-op diagnosis:      Post-traumatic osteoarthritis of one knee M17.30     Painful orthopaedic hardware T84.84XA   Location: [facility] OR ROOM 01 / [place] [facility]   Surgeons: Hessie Knows, MD     NOTE: Available PAT nursing documentation and vital signs have been reviewed. Clinical nursing staff has updated patient's PMH/PSHx, current medication list, and drug allergies/intolerances to ensure comprehensive history available to assist in medical decision making as it pertains to the aforementioned surgical procedure and anticipated anesthetic course. Extensive review of available clinical information performed. [place] PMH and PSHx updated with any diagnoses/procedures that  may have been inadvertently omitted during his intake with the pre-admission testing department's nursing staff.  Clinical Discussion:  Zachary Martinez is a 80 y.o. male who is submitted for pre-surgical anesthesia review and clearance prior to him undergoing the above procedure. Patient is a Former Smoker (quit 07/1979). Pertinent PMH includes: angina, CAD, valvular insufficiency, LVH, lower extremity venous insufficiency, carotid artery disease, HTN, HLD, T2DM, hypothyroidism, OSAH (requires nocturnal PAP therapy), OA, diabetic neuropathy.  Patient is followed by cardiology Nehemiah Massed, MD). He was last seen in the cardiology clinic on 01/28/2021; notes reviewed.  At the time of his clinic visit, patient reported to be "doing okay" from a cardiovascular perspective.  He denied any chest pain, however reported mild exertional dyspnea.  He denied PND, orthopnea, palpitations, vertiginous  symptoms, or presyncope/syncope.  Patient with bilateral lower extremity edema, however this is chronic generally tends to relieve with elevation.  PMH significant for cardiovascular diagnoses.  Patient with complaints of progressive angina.  He underwent diagnostic left heart catheterization in 01/2013 that revealed moderate three-vessel nonobstructive CAD; 30% stenosis distal LM, 40% stenosis ostial LAD, 50% stenosis proximal LAD, 40% stenosis first diagonal, 30% stenosis proximal LCx, 60% stenosis mid LCx, 20% stenosis mid LCx, 60% stenosis OM1, 20% stenosis proximal RCA, and 70% stenosis of the RPDA.  Culprit lesion leading to patient's angina felt to be RPDA lesion, however PCI was deferred opting for medical management.  TTE performed on 06/29/2020 revealed normal left ventricular systolic function with mild LVH; LVEF 55 to 60%.  There was mild mitral, tricuspid, and pulmonary valvular regurgitation.  Left atrium was mildly enlarged.  Myocardial perfusion imaging study performed on 06/29/2020 revealed an LVEF of 50%.  There was no evidence of stress-induced myocardial ischemia or arrhythmia (see full interpretation of cardiovascular testing below).  Blood pressure elevated despite CCB, beta-blocker, and vasodilator therapies.  Patient is on a statin for his HLD.  DM well controlled on currently prescribed regimen; last Hgb A1c was 6.9% on 01/05/2021. Patient with OSAH diagnosis; compliant with prescribed nocturnal PAP therapy. Functional capacity, as defined by DASI, is documented as being >/= 4 METS.  No changes were made to patient's medication regimen.  Patient to follow-up with outpatient cardiology in 6 months or sooner if needed.  Patient is scheduled for an elective total knee arthroplasty on 06/08/2021 with Dr. Hessie Knows, MD. Given patient's past medical history significant for cardiovascular diagnoses, presurgical cardiac clearance was sought by the PAT team. "The patient is at the lowest  risk possible for perioperative cardiovascular complications with the  planned procedure.  The overall risk his procedure is low (<1%).  Currently has no evidence active and/or significant angina and/or congestive heart failure. Patient may proceed to surgery without restriction or need for further cardiovascular testing and an overall LOW risk".  This patient is not on any type of anticoagulation/antiplatelet therapies that will need to be held preoperatively.  Patient denies previous perioperative complications with anesthesia in the past. In review of the available records, it is noted that patient underwent a general anesthetic course here (ASA III) in 03/2021 without documented complications.   Vitals with BMI 05/28/2021 04/01/2021 04/01/2021  Height 5\' 9"  - -  Weight 243 lbs 6 oz - -  BMI 63.84 - -  Systolic [phone]  Diastolic 62 63 86  Pulse 49 59 59    Providers/Specialists:   NOTE: Primary physician provider listed below. Patient may have been seen by APP or partner within same practice.   PROVIDER ROLE / SPECIALTY LAST Fabio Bering, MD Orthopedics (Surgeon) 05/31/2021  Jerrol Banana., MD Primary Care Provider 01/05/2021  Serafina Royals, MD Cardiology 01/28/2021   Allergies:  Ace inhibitors  Current Home Medications:   No current facility-administered medications for this encounter.    amLODipine (NORVASC) 10 MG tablet   atorvastatin (LIPITOR) 40 MG tablet   Canagliflozin-metFORMIN HCl (INVOKAMET) [phone] MG TABS   carvedilol (COREG) 6.25 MG tablet   Cholecalciferol (VITAMIN D3) 50 MCG (2000 UT) capsule   EPINEPHrine 0.3 mg/0.3 mL IJ SOAJ injection   hydrALAZINE (APRESOLINE) 100 MG tablet   levothyroxine (SYNTHROID) 88 MCG tablet   Omega-3 Fatty Acids (FISH OIL) 1000 MG CAPS   pioglitazone (ACTOS) 30 MG tablet   History:   Past Medical History:  Diagnosis Date   Arthritis    knees    Bilateral carotid artery disease (HCC)    CAD (coronary artery  disease) 02/13/2013   3v CAD; no PCI   Cataract    removed right eye. left eye forming    Diverticulosis    Hyperlipidemia    Hypertension    Hypothyroidism    LVH (left ventricular hypertrophy)    Neuropathy    OSA on CPAP    T2DM (type 2 diabetes mellitus) ([place])    Valvular insufficiency    a.) moderate MR and TR   Venous insufficiency of both lower extremities    Past Surgical History:  Procedure Laterality Date   CARDIAC CATHETERIZATION N/A 02/13/2013   Moderate 3v CAD; no PCI; Location: [facility]; Surgeon: Serafina Royals, MD   CATARACT EXTRACTION W/PHACO Right 11/01/2016   Procedure: CATARACT EXTRACTION PHACO AND INTRAOCULAR LENS PLACEMENT ([place]);  Surgeon: Birder Robson, MD;  Location: [facility] ORS;  Service: Ophthalmology;  Laterality: Right;  Lot #[id number] H [place]: oo:35.4 AP%; 22.4 CDE: 7.91   COLONOSCOPY     CYST REMOVAL NECK     EYE SURGERY     to remove blood from busted blood vessel   HARDWARE REMOVAL Left 04/01/2021   Procedure: Screw removal from left proximal tibia;  Surgeon: Hessie Knows, MD;  Location: [facility] ORS;  Service: Orthopedics;  Laterality: Left;   KNEE ARTHROSCOPY Right 2003, 2005   x2   KNEE ARTHROSCOPY Left 2013   nasal polyps  1978   POLYPECTOMY     TIBIA FRACTURE SURGERY Left 2012   TONSILLECTOMY AND ADENOIDECTOMY     UVULOPLASTY N/A 2000   Family History  Problem Relation Age of Onset   Colon cancer Brother 70   Colon  cancer Maternal Aunt 70   Colon cancer Maternal Aunt 22   Kidney disease Mother    Anemia Mother    Diabetes Brother    Hyperlipidemia Brother    Hypertension Brother    Colon polyps Brother    Esophageal cancer Neg Hx    Rectal cancer Neg Hx    Stomach cancer Neg Hx    Social History   Tobacco Use   Smoking status: Former    Types: Cigarettes    Quit date: 07/24/1979    Years since quitting: 41.8   Smokeless tobacco: Former    Quit date: 11/01/1979  Vaping Use   Vaping Use: Never used  Substance Use Topics   Alcohol  use: Not Currently    Comment: wine 2-3xs a year   Drug use: No    Pertinent Clinical Results:  LABS: Labs reviewed: Acceptable for surgery.  No visits with results within 3 Day(s) from this visit.  Latest known visit with results is:  Hospital Outpatient Visit on 05/28/2021  Component Date Value Ref Range Status   WBC 05/28/2021 4.5  4.0 - 10.5 K/uL Final   RBC 05/28/2021 4.19 (A) 4.22 - 5.81 MIL/uL Final   Hemoglobin 05/28/2021 13.2  13.0 - 17.0 g/dL Final   HCT 05/28/2021 39.6  39.0 - 52.0 % Final   MCV 05/28/2021 94.5  80.0 - 100.0 fL Final   MCH 05/28/2021 31.5  26.0 - 34.0 pg Final   MCHC 05/28/2021 33.3  30.0 - 36.0 g/dL Final   RDW 05/28/2021 15.0  11.5 - 15.5 % Final   Platelets 05/28/2021 171  150 - 400 K/uL Final   nRBC 05/28/2021 0.0  0.0 - 0.2 % Final   Neutrophils Relative % 05/28/2021 63  % Final   Neutro Abs 05/28/2021 2.9  1.7 - 7.7 K/uL Final   Lymphocytes Relative 05/28/2021 23  % Final   Lymphs Abs 05/28/2021 1.1  0.7 - 4.0 K/uL Final   Monocytes Relative 05/28/2021 10  % Final   Monocytes Absolute 05/28/2021 0.5  0.1 - 1.0 K/uL Final   Eosinophils Relative 05/28/2021 2  % Final   Eosinophils Absolute 05/28/2021 0.1  0.0 - 0.5 K/uL Final   Basophils Relative 05/28/2021 1  % Final   Basophils Absolute 05/28/2021 0.0  0.0 - 0.1 K/uL Final   Immature Granulocytes 05/28/2021 1  % Final   Abs Immature Granulocytes 05/28/2021 0.03  0.00 - 0.07 K/uL Final   Performed at [facility], [address] [place], [place] [address]   Sodium 05/28/2021 138  135 - 145 mmol/L Final   Potassium 05/28/2021 4.2  3.5 - 5.1 mmol/L Final   Chloride 05/28/2021 112 (A) 98 - 111 mmol/L Final   CO2 05/28/2021 23  22 - 32 mmol/L Final   Glucose, Bld 05/28/2021 107 (A) 70 - 99 mg/dL Final   Glucose reference range applies only to samples taken after fasting for at least 8 hours.   BUN 05/28/2021 19  8 - 23 mg/dL Final   Creatinine, Ser 05/28/2021 0.71  0.61 - 1.24 mg/dL  Final   Calcium 05/28/2021 10.4 (A) 8.9 - 10.3 mg/dL Final   Total Protein 05/28/2021 6.3 (A) 6.5 - 8.1 g/dL Final   Albumin 05/28/2021 3.7  3.5 - 5.0 g/dL Final   AST 05/28/2021 16  15 - 41 U/L Final   ALT 05/28/2021 18  0 - 44 U/L Final   Alkaline Phosphatase 05/28/2021 73  38 - 126 U/L Final  Total Bilirubin 05/28/2021 0.9  0.3 - 1.2 mg/dL Final   GFR, Estimated 05/28/2021 >60  >60 mL/min Final   Comment: (NOTE) Calculated using the CKD-EPI Creatinine Equation (2021)    Anion gap 05/28/2021 3 (A) 5 - 15 Final   Performed at [facility], [address] [place], [place] [address]   Color, Urine 05/28/2021 YELLOW (A) YELLOW Final   APPearance 05/28/2021 CLEAR (A) CLEAR Final   Specific Gravity, Urine 05/28/2021 1.030  1.005 - 1.030 Final   pH 05/28/2021 5.0  5.0 - 8.0 Final   Glucose, UA 05/28/2021 >=500 (A) NEGATIVE mg/dL Final   Hgb urine dipstick 05/28/2021 NEGATIVE  NEGATIVE Final   Bilirubin Urine 05/28/2021 NEGATIVE  NEGATIVE Final   Ketones, ur 05/28/2021 NEGATIVE  NEGATIVE mg/dL Final   Protein, ur 05/28/2021 NEGATIVE  NEGATIVE mg/dL Final   Nitrite 05/28/2021 NEGATIVE  NEGATIVE Final   Leukocytes,Ua 05/28/2021 NEGATIVE  NEGATIVE Final   RBC / HPF 05/28/2021 0-5  0 - 5 RBC/hpf Final   WBC, UA 05/28/2021 0-5  0 - 5 WBC/hpf Final   Bacteria, UA 05/28/2021 NONE SEEN  NONE SEEN Final   Squamous Epithelial / LPF 05/28/2021 NONE SEEN  0 - 5 Final   Performed at [facility], [address] [place], [place] [address]   ABO/RH(D) 05/28/2021 O POS   Final   Antibody Screen 05/28/2021 POS   Final   Sample Expiration 05/28/2021 06/11/2021,2359   Final   Extend sample reason 05/28/2021 NO TRANSFUSIONS OR PREGNANCY IN THE PAST 3 MONTHS   Final   Antibody Identification [id number]    Final                   Value:NON SPECIFIC ANTIBODY REACTIVITY Performed at [facility], [address] [place], [place] [address]    MRSA, PCR 05/28/2021 NEGATIVE   NEGATIVE Final   Staphylococcus aureus 05/28/2021 NEGATIVE  NEGATIVE Final   Comment: (NOTE) The Xpert SA Assay (FDA approved for NASAL specimens in patients 82 years of age and older), is one component of a comprehensive surveillance program. It is not intended to diagnose infection nor to guide or monitor treatment. Performed at [facility], [address] [place], [place] [address]     ECG: Date: 03/25/2021 Time ECG obtained: 0820 AM Rate: 50 bpm Rhythm:  Sinus bradycardia with occasional PVCs Axis (leads I and aVF): Normal Intervals: PR 200 ms. QRS 112 ms. QTc 379 ms. ST segment and T wave changes: No evidence of acute ST segment elevation or depression Comparison: Similar to previous tracing obtained on 05/18/2020   IMAGING / PROCEDURES: LEXISCAN performed on 06/29/2020 LVEF 50% Regional wall motion reveals normal myocardial thickening and wall motion No artifacts noted Left ventricular cavity size normal No evidence of stress-induced myocardial ischemia or arrhythmia The overall quality of the study is good  TRANSTHORACIC ECHOCARDIOGRAM performed on 06/29/2020 LVEF 55-60% Normal left ventricular systolic function with mild LVH Normal right ventricular systolic function Mild left atrial enlargement Mild MR, TR, PR No AR No valvular stenosis No evidence of pericardial effusion  LEFT HEART CATHETERIZATION AND CORONARY ANGIOGRAPHY performed on 02/13/2013 Moderate three-vessel CAD 30% stenosis distal LM 40% stenosis ostial LAD 50% stenosis proximal LAD 40% stenosis first diagonal 30% stenosis proximal LCx 60% stenosis mid LCx 20% stenosis mid LCx 60% stenosis OM1 20% stenosis proximal RCA 70% stenosis RPDA Culprit lesion felt to be stenotic RPDA Recommendations: Defer PCI and continue aggressive medical management  Impression  and Plan:  Zachary Martinez has been referred for pre-anesthesia review and clearance prior to him undergoing the planned  anesthetic and procedural courses. Available labs, pertinent testing, and imaging results were personally reviewed by me. This patient has been appropriately cleared by cardiology with an overall LOW risk of significant perioperative cardiovascular complications.  Based on clinical review performed today (06/01/21), barring any significant acute changes in the patient's overall condition, it is anticipated that he will be able to proceed with the planned surgical intervention. Any acute changes in clinical condition may necessitate his procedure being postponed and/or cancelled. Patient will meet with anesthesia team (MD and/or CRNA) on the day of his procedure for preoperative evaluation/assessment. Questions regarding anesthetic course will be fielded at that time.   Pre-surgical instructions were reviewed with the patient during his PAT appointment and questions were fielded by PAT clinical staff. Patient was advised that if any questions or concerns arise prior to his procedure then he should return a call to PAT and/or his surgeon's office to discuss.  Honor Loh, MSN, APRN, FNP-C, CEN [facility]  Peri-operative Services Nurse Practitioner Phone: [phone] Fax: [phone] 06/01/21 2:22 PM  NOTE: This note has been prepared using Dragon dictation software. Despite my best ability to proofread, there is always the potential that unintentional transcriptional errors may still occur from this process.

## 2021-06-04 ENCOUNTER — Other Ambulatory Visit
Admission: RE | Admit: 2021-06-04 | Discharge: 2021-06-04 | Disposition: A | Discharge status: Home / Self Care | Payer: Medicare Other | Source: Ambulatory Visit | Attending: Orthopedic Surgery | Admitting: Orthopedic Surgery

## 2021-06-04 ENCOUNTER — Other Ambulatory Visit: Payer: Self-pay

## 2021-06-04 DIAGNOSIS — Z20822 Contact with and (suspected) exposure to covid-19: Secondary | ICD-10-CM | POA: Insufficient documentation

## 2021-06-04 DIAGNOSIS — Z01812 Encounter for preprocedural laboratory examination: Secondary | ICD-10-CM | POA: Insufficient documentation

## 2021-06-04 LAB — SARS CORONAVIRUS 2 (TAT 6-24 HRS): SARS Coronavirus 2: NEGATIVE

## 2021-06-08 ENCOUNTER — Inpatient Hospital Stay: Payer: Medicare Other | Admitting: Urgent Care

## 2021-06-08 ENCOUNTER — Encounter: Payer: Self-pay | Admitting: Orthopedic Surgery

## 2021-06-08 ENCOUNTER — Inpatient Hospital Stay: Payer: Medicare Other

## 2021-06-08 ENCOUNTER — Encounter
Admission: RE | Disposition: A | Discharge status: Home Health | Payer: Self-pay | Source: Home / Self Care | Attending: Orthopedic Surgery

## 2021-06-08 ENCOUNTER — Inpatient Hospital Stay
Admission: RE | Admit: 2021-06-08 | Discharge: 2021-06-10 | DRG: 470 | Disposition: A | Discharge status: Home Health | Payer: Medicare Other | Attending: Orthopedic Surgery | Admitting: Orthopedic Surgery

## 2021-06-08 ENCOUNTER — Other Ambulatory Visit: Payer: Self-pay

## 2021-06-08 DIAGNOSIS — Z96652 Presence of left artificial knee joint: Secondary | ICD-10-CM

## 2021-06-08 DIAGNOSIS — Z79890 Hormone replacement therapy (postmenopausal): Secondary | ICD-10-CM | POA: Diagnosis not present

## 2021-06-08 DIAGNOSIS — I10 Essential (primary) hypertension: Secondary | ICD-10-CM | POA: Diagnosis present

## 2021-06-08 DIAGNOSIS — I2510 Atherosclerotic heart disease of native coronary artery without angina pectoris: Secondary | ICD-10-CM | POA: Diagnosis present

## 2021-06-08 DIAGNOSIS — Z7984 Long term (current) use of oral hypoglycemic drugs: Secondary | ICD-10-CM

## 2021-06-08 DIAGNOSIS — Z8249 Family history of ischemic heart disease and other diseases of the circulatory system: Secondary | ICD-10-CM

## 2021-06-08 DIAGNOSIS — Z833 Family history of diabetes mellitus: Secondary | ICD-10-CM | POA: Diagnosis not present

## 2021-06-08 DIAGNOSIS — M1712 Unilateral primary osteoarthritis, left knee: Principal | ICD-10-CM | POA: Diagnosis present

## 2021-06-08 DIAGNOSIS — G8918 Other acute postprocedural pain: Secondary | ICD-10-CM

## 2021-06-08 DIAGNOSIS — E785 Hyperlipidemia, unspecified: Secondary | ICD-10-CM | POA: Diagnosis present

## 2021-06-08 DIAGNOSIS — Z79899 Other long term (current) drug therapy: Secondary | ICD-10-CM | POA: Diagnosis not present

## 2021-06-08 DIAGNOSIS — M1732 Unilateral post-traumatic osteoarthritis, left knee: Secondary | ICD-10-CM | POA: Diagnosis not present

## 2021-06-08 DIAGNOSIS — E1142 Type 2 diabetes mellitus with diabetic polyneuropathy: Secondary | ICD-10-CM | POA: Diagnosis present

## 2021-06-08 DIAGNOSIS — Z800 Family history of malignant neoplasm of digestive organs: Secondary | ICD-10-CM

## 2021-06-08 DIAGNOSIS — E039 Hypothyroidism, unspecified: Secondary | ICD-10-CM | POA: Diagnosis present

## 2021-06-08 DIAGNOSIS — Z20822 Contact with and (suspected) exposure to covid-19: Secondary | ICD-10-CM | POA: Diagnosis present

## 2021-06-08 DIAGNOSIS — Z87891 Personal history of nicotine dependence: Secondary | ICD-10-CM

## 2021-06-08 DIAGNOSIS — T8484XA Pain due to internal orthopedic prosthetic devices, implants and grafts, initial encounter: Secondary | ICD-10-CM | POA: Diagnosis not present

## 2021-06-08 HISTORY — DX: Polyneuropathy, unspecified: G62.9

## 2021-06-08 HISTORY — DX: Nonrheumatic mitral (valve) insufficiency: I34.0

## 2021-06-08 HISTORY — DX: Obstructive sleep apnea (adult) (pediatric): G47.33

## 2021-06-08 HISTORY — DX: Cardiomegaly: I51.7

## 2021-06-08 HISTORY — DX: Endocarditis, valve unspecified: I38

## 2021-06-08 HISTORY — DX: Rheumatic tricuspid insufficiency: I07.1

## 2021-06-08 HISTORY — DX: Disorder of arteries and arterioles, unspecified: I77.9

## 2021-06-08 HISTORY — DX: Type 2 diabetes mellitus without complications: E11.9

## 2021-06-08 HISTORY — PX: TOTAL KNEE ARTHROPLASTY: SHX125

## 2021-06-08 HISTORY — DX: Venous insufficiency (chronic) (peripheral): I87.2

## 2021-06-08 LAB — CBC
HCT: 38.5 % — ABNORMAL LOW (ref 39.0–52.0)
Hemoglobin: 12.8 g/dL — ABNORMAL LOW (ref 13.0–17.0)
MCH: 31.8 pg (ref 26.0–34.0)
MCHC: 33.2 g/dL (ref 30.0–36.0)
MCV: 95.8 fL (ref 80.0–100.0)
Platelets: 170 10*3/uL (ref 150–400)
RBC: 4.02 MIL/uL — ABNORMAL LOW (ref 4.22–5.81)
RDW: 15.1 % (ref 11.5–15.5)
WBC: 6.7 10*3/uL (ref 4.0–10.5)
nRBC: 0 % (ref 0.0–0.2)

## 2021-06-08 LAB — GLUCOSE, CAPILLARY
Glucose-Capillary: 158 mg/dL — ABNORMAL HIGH (ref 70–99)
Glucose-Capillary: 179 mg/dL — ABNORMAL HIGH (ref 70–99)
Glucose-Capillary: 136 mg/dL — ABNORMAL HIGH (ref 70–99)
Glucose-Capillary: 150 mg/dL — ABNORMAL HIGH (ref 70–99)
Glucose-Capillary: 188 mg/dL — ABNORMAL HIGH (ref 70–99)

## 2021-06-08 LAB — ABO/RH: ABO/RH(D): O POS

## 2021-06-08 LAB — CREATININE, SERUM
Creatinine, Ser: 0.98 mg/dL (ref 0.61–1.24)
GFR, Estimated: >60 mL/min (ref >60)

## 2021-06-08 SURGERY — ARTHROPLASTY, KNEE, TOTAL
Anesthesia: Spinal | Site: Knee | Laterality: Left

## 2021-06-08 MED ORDER — DOCUSATE SODIUM 100 MG PO CAPS
100.0000 mg | ORAL_CAPSULE | Freq: Two times a day (BID) | ORAL | Status: DC
  Administered 2021-06-08 – 2021-06-10 (×4): 100 mg via ORAL
  Filled 2021-06-08 (×4): qty 1

## 2021-06-08 MED ORDER — PHENOL 1.4 % MT LIQD
1.0000 | OROMUCOSAL | Status: DC | PRN
  Filled 2021-06-08: qty 177

## 2021-06-08 MED ORDER — FLEET ENEMA 7-19 GM/118ML RE ENEM
1.0000 | ENEMA | Freq: Once | RECTAL | Status: DC | PRN
Start: 2021-06-08 — End: 2021-06-10

## 2021-06-08 MED ORDER — CEFAZOLIN SODIUM-DEXTROSE 2-4 GM/100ML-% IV SOLN
2.0000 g | INTRAVENOUS | Status: AC
  Administered 2021-06-08: 2 g via INTRAVENOUS

## 2021-06-08 MED ORDER — BUPIVACAINE HCL (PF) 0.5 % IJ SOLN
INTRAMUSCULAR | Status: DC | PRN
  Administered 2021-06-08: 3 mL via INTRATHECAL

## 2021-06-08 MED ORDER — MORPHINE SULFATE (PF) 10 MG/ML IV SOLN
INTRAVENOUS | Status: DC | PRN
  Administered 2021-06-08: 10 mg

## 2021-06-08 MED ORDER — KETOROLAC TROMETHAMINE 30 MG/ML IJ SOLN
INTRAMUSCULAR | Status: DC | PRN
  Administered 2021-06-08: 30 mg via INTRA_ARTICULAR

## 2021-06-08 MED ORDER — PROPOFOL 1000 MG/100ML IV EMUL
INTRAVENOUS | Status: AC
  Filled 2021-06-08: qty 100

## 2021-06-08 MED ORDER — ENOXAPARIN SODIUM 30 MG/0.3ML IJ SOSY
30.0000 mg | PREFILLED_SYRINGE | Freq: Two times a day (BID) | INTRAMUSCULAR | Status: DC
  Administered 2021-06-09 – 2021-06-10 (×3): 30 mg via SUBCUTANEOUS
  Filled 2021-06-08 (×3): qty 0.3

## 2021-06-08 MED ORDER — CEFAZOLIN SODIUM-DEXTROSE 2-4 GM/100ML-% IV SOLN
2.0000 g | Freq: Four times a day (QID) | INTRAVENOUS | Status: AC
  Administered 2021-06-08 (×2): 2 g via INTRAVENOUS
  Filled 2021-06-08 (×2): qty 100

## 2021-06-08 MED ORDER — MORPHINE SULFATE (PF) 2 MG/ML IV SOLN: 0.5000 mg | INTRAVENOUS | Status: DC | PRN

## 2021-06-08 MED ORDER — CEFAZOLIN SODIUM-DEXTROSE 2-4 GM/100ML-% IV SOLN
INTRAVENOUS | Status: AC
  Filled 2021-06-08: qty 100

## 2021-06-08 MED ORDER — SURGIPHOR WOUND IRRIGATION SYSTEM - OPTIME
TOPICAL | Status: DC | PRN
  Administered 2021-06-08: 1 via TOPICAL

## 2021-06-08 MED ORDER — FENTANYL CITRATE (PF) 100 MCG/2ML IJ SOLN: 25.0000 ug | INTRAMUSCULAR | Status: DC | PRN

## 2021-06-08 MED ORDER — HYDROCODONE-ACETAMINOPHEN 5-325 MG PO TABS
1.0000 | ORAL_TABLET | ORAL | Status: DC | PRN
  Administered 2021-06-08: 1 via ORAL
  Administered 2021-06-09 (×2): 2 via ORAL
  Administered 2021-06-10: 1 via ORAL
  Filled 2021-06-08: qty 2
  Filled 2021-06-08: qty 1
  Filled 2021-06-08: qty 2
  Filled 2021-06-08: qty 1

## 2021-06-08 MED ORDER — FENTANYL CITRATE (PF) 100 MCG/2ML IJ SOLN
INTRAMUSCULAR | Status: AC
  Filled 2021-06-08: qty 2

## 2021-06-08 MED ORDER — PANTOPRAZOLE SODIUM 40 MG PO TBEC
40.0000 mg | DELAYED_RELEASE_TABLET | Freq: Every day | ORAL | Status: DC
  Administered 2021-06-09 – 2021-06-10 (×2): 40 mg via ORAL
  Filled 2021-06-08 (×2): qty 1

## 2021-06-08 MED ORDER — FAMOTIDINE 20 MG PO TABS
ORAL_TABLET | ORAL | Status: AC
  Administered 2021-06-08: 20 mg via ORAL
  Filled 2021-06-08: qty 1

## 2021-06-08 MED ORDER — INSULIN ASPART 100 UNIT/ML IJ SOLN
0.0000 [IU] | Freq: Three times a day (TID) | INTRAMUSCULAR | Status: DC
  Administered 2021-06-08 (×2): 2 [IU] via SUBCUTANEOUS
  Administered 2021-06-09: 8 [IU] via SUBCUTANEOUS
  Administered 2021-06-09 – 2021-06-10 (×4): 3 [IU] via SUBCUTANEOUS
  Filled 2021-06-08 (×7): qty 1

## 2021-06-08 MED ORDER — 0.9 % SODIUM CHLORIDE (POUR BTL) OPTIME
TOPICAL | Status: DC | PRN
  Administered 2021-06-08: 1000 mL

## 2021-06-08 MED ORDER — HYDRALAZINE HCL 50 MG PO TABS
100.0000 mg | ORAL_TABLET | Freq: Two times a day (BID) | ORAL | Status: DC
  Administered 2021-06-08 – 2021-06-10 (×4): 100 mg via ORAL
  Filled 2021-06-08 (×4): qty 2

## 2021-06-08 MED ORDER — HYDROCODONE-ACETAMINOPHEN 7.5-325 MG PO TABS
1.0000 | ORAL_TABLET | ORAL | Status: DC | PRN
  Administered 2021-06-08: 2 via ORAL
  Administered 2021-06-08: 1 via ORAL
  Filled 2021-06-08 (×2): qty 1
  Filled 2021-06-08: qty 2

## 2021-06-08 MED ORDER — AMLODIPINE BESYLATE 10 MG PO TABS
10.0000 mg | ORAL_TABLET | Freq: Every day | ORAL | Status: DC
  Administered 2021-06-09 – 2021-06-10 (×2): 10 mg via ORAL
  Filled 2021-06-08 (×2): qty 1

## 2021-06-08 MED ORDER — VITAMIN D 25 MCG (1000 UNIT) PO TABS
2000.0000 [IU] | ORAL_TABLET | Freq: Every day | ORAL | Status: DC
  Administered 2021-06-09 – 2021-06-10 (×2): 2000 [IU] via ORAL
  Filled 2021-06-08 (×2): qty 2

## 2021-06-08 MED ORDER — MENTHOL 3 MG MT LOZG
1.0000 | LOZENGE | OROMUCOSAL | Status: DC | PRN
  Filled 2021-06-08: qty 9

## 2021-06-08 MED ORDER — PIOGLITAZONE HCL 30 MG PO TABS
30.0000 mg | ORAL_TABLET | Freq: Every day | ORAL | Status: DC
  Administered 2021-06-09 – 2021-06-10 (×2): 30 mg via ORAL
  Filled 2021-06-08 (×2): qty 1

## 2021-06-08 MED ORDER — DIPHENHYDRAMINE HCL 12.5 MG/5ML PO ELIX: 12.5000 mg | ORAL_SOLUTION | ORAL | Status: DC | PRN

## 2021-06-08 MED ORDER — KETOROLAC TROMETHAMINE 30 MG/ML IJ SOLN
INTRAMUSCULAR | Status: AC
  Filled 2021-06-08: qty 1

## 2021-06-08 MED ORDER — PHENYLEPHRINE HCL (PRESSORS) 10 MG/ML IV SOLN
INTRAVENOUS | Status: AC
  Filled 2021-06-08: qty 1

## 2021-06-08 MED ORDER — BUPIVACAINE-EPINEPHRINE (PF) 0.25% -1:200000 IJ SOLN
INTRAMUSCULAR | Status: AC
  Filled 2021-06-08: qty 30

## 2021-06-08 MED ORDER — ONDANSETRON HCL 4 MG/2ML IJ SOLN: 4.0000 mg | Freq: Four times a day (QID) | INTRAMUSCULAR | Status: DC | PRN

## 2021-06-08 MED ORDER — ONDANSETRON HCL 4 MG/2ML IJ SOLN: 4.0000 mg | Freq: Once | INTRAMUSCULAR | Status: DC | PRN

## 2021-06-08 MED ORDER — ONDANSETRON HCL 4 MG PO TABS: 4.0000 mg | ORAL_TABLET | Freq: Four times a day (QID) | ORAL | Status: DC | PRN

## 2021-06-08 MED ORDER — CHLORHEXIDINE GLUCONATE 0.12 % MT SOLN: 15.0000 mL | Freq: Once | OROMUCOSAL | Status: AC

## 2021-06-08 MED ORDER — ALUM & MAG HYDROXIDE-SIMETH 200-200-20 MG/5ML PO SUSP: 30.0000 mL | ORAL | Status: DC | PRN

## 2021-06-08 MED ORDER — POLYETHYLENE GLYCOL 3350 17 G PO PACK
17.0000 g | PACK | Freq: Every day | ORAL | Status: DC | PRN
  Administered 2021-06-09: 17 g via ORAL
  Filled 2021-06-08: qty 1

## 2021-06-08 MED ORDER — SODIUM CHLORIDE FLUSH 0.9 % IV SOLN
INTRAVENOUS | Status: AC
  Filled 2021-06-08: qty 40

## 2021-06-08 MED ORDER — ACETAMINOPHEN 325 MG PO TABS
325.0000 mg | ORAL_TABLET | Freq: Four times a day (QID) | ORAL | Status: DC | PRN
Start: 2021-06-09 — End: 2021-06-10

## 2021-06-08 MED ORDER — FENTANYL CITRATE (PF) 100 MCG/2ML IJ SOLN
INTRAMUSCULAR | Status: DC | PRN
  Administered 2021-06-08: 50 ug via INTRAVENOUS

## 2021-06-08 MED ORDER — ONDANSETRON HCL 4 MG/2ML IJ SOLN
INTRAMUSCULAR | Status: DC | PRN
  Administered 2021-06-08: 4 mg via INTRAVENOUS

## 2021-06-08 MED ORDER — GLYCOPYRROLATE 0.2 MG/ML IJ SOLN
INTRAMUSCULAR | Status: DC | PRN
  Administered 2021-06-08: .2 mg via INTRAVENOUS

## 2021-06-08 MED ORDER — LEVOTHYROXINE SODIUM 88 MCG PO TABS
88.0000 ug | ORAL_TABLET | Freq: Every day | ORAL | Status: DC
  Administered 2021-06-09 – 2021-06-10 (×2): 88 ug via ORAL
  Filled 2021-06-08 (×2): qty 1

## 2021-06-08 MED ORDER — ACETAMINOPHEN 10 MG/ML IV SOLN
INTRAVENOUS | Status: AC
  Filled 2021-06-08: qty 100

## 2021-06-08 MED ORDER — SODIUM CHLORIDE 0.9 % IV SOLN: INTRAVENOUS | Status: DC

## 2021-06-08 MED ORDER — ZOLPIDEM TARTRATE 5 MG PO TABS
5.0000 mg | ORAL_TABLET | Freq: Every evening | ORAL | Status: DC | PRN
  Administered 2021-06-08 – 2021-06-09 (×2): 5 mg via ORAL
  Filled 2021-06-08 (×2): qty 1

## 2021-06-08 MED ORDER — PROPOFOL 500 MG/50ML IV EMUL
INTRAVENOUS | Status: DC | PRN
  Administered 2021-06-08: 100 ug/kg/min via INTRAVENOUS

## 2021-06-08 MED ORDER — ORAL CARE MOUTH RINSE: 15.0000 mL | Freq: Once | OROMUCOSAL | Status: AC

## 2021-06-08 MED ORDER — PROPOFOL 10 MG/ML IV BOLUS
INTRAVENOUS | Status: DC | PRN
  Administered 2021-06-08: 10 mg via INTRAVENOUS

## 2021-06-08 MED ORDER — CARVEDILOL 12.5 MG PO TABS
6.2500 mg | ORAL_TABLET | Freq: Two times a day (BID) | ORAL | Status: DC
  Administered 2021-06-08 – 2021-06-10 (×3): 6.25 mg via ORAL
  Filled 2021-06-08 (×4): qty 1

## 2021-06-08 MED ORDER — TRAMADOL HCL 50 MG PO TABS
50.0000 mg | ORAL_TABLET | Freq: Four times a day (QID) | ORAL | Status: DC
  Administered 2021-06-08 – 2021-06-10 (×9): 50 mg via ORAL
  Filled 2021-06-08 (×9): qty 1

## 2021-06-08 MED ORDER — METHOCARBAMOL 500 MG PO TABS: 500.0000 mg | ORAL_TABLET | Freq: Four times a day (QID) | ORAL | Status: DC | PRN

## 2021-06-08 MED ORDER — CHLORHEXIDINE GLUCONATE 0.12 % MT SOLN
OROMUCOSAL | Status: AC
  Administered 2021-06-08: 15 mL via OROMUCOSAL
  Filled 2021-06-08: qty 15

## 2021-06-08 MED ORDER — SODIUM CHLORIDE 0.9 % IV SOLN
INTRAVENOUS | Status: DC | PRN
  Administered 2021-06-08: 60 mL

## 2021-06-08 MED ORDER — SODIUM CHLORIDE 0.9 % IV SOLN
INTRAVENOUS | Status: DC | PRN
  Administered 2021-06-08: 30 ug/min via INTRAVENOUS

## 2021-06-08 MED ORDER — ACETAMINOPHEN 10 MG/ML IV SOLN
INTRAVENOUS | Status: DC | PRN
Start: 2021-06-08 — End: 2021-06-08
  Administered 2021-06-08: 1000 mg via INTRAVENOUS

## 2021-06-08 MED ORDER — FAMOTIDINE 20 MG PO TABS: 20.0000 mg | ORAL_TABLET | Freq: Once | ORAL | Status: AC

## 2021-06-08 MED ORDER — METHOCARBAMOL 1000 MG/10ML IJ SOLN
500.0000 mg | Freq: Four times a day (QID) | INTRAVENOUS | Status: DC | PRN
  Filled 2021-06-08: qty 5

## 2021-06-08 MED ORDER — BISACODYL 10 MG RE SUPP: 10.0000 mg | Freq: Every day | RECTAL | Status: DC | PRN

## 2021-06-08 MED ORDER — BUPIVACAINE LIPOSOME 1.3 % IJ SUSP
INTRAMUSCULAR | Status: AC
  Filled 2021-06-08: qty 20

## 2021-06-08 MED ORDER — ATORVASTATIN CALCIUM 20 MG PO TABS
40.0000 mg | ORAL_TABLET | Freq: Every day | ORAL | Status: DC
  Administered 2021-06-08 – 2021-06-09 (×2): 40 mg via ORAL
  Filled 2021-06-08 (×2): qty 2

## 2021-06-08 MED ORDER — MORPHINE SULFATE (PF) 10 MG/ML IV SOLN
INTRAVENOUS | Status: AC
  Filled 2021-06-08: qty 1

## 2021-06-08 SURGICAL SUPPLY — 72 items
BLADE SAGITTAL 25.0X1.19X90 (BLADE) ×2 IMPLANT
BLADE SAW 90X13X1.19 OSCILLAT (BLADE) ×2 IMPLANT
BLOCK CUTTING FEMUR 5+ LT MED (MISCELLANEOUS) ×2 IMPLANT
BLOCK CUTTING TIBIAL 5 LT (MISCELLANEOUS) ×2 IMPLANT
BNDG ELASTIC 6X5.8 VLCR STR LF (GAUZE/BANDAGES/DRESSINGS) ×2 IMPLANT
CANISTER SUCT 1200ML W/VALVE (MISCELLANEOUS) ×2 IMPLANT
CANISTER WOUND CARE 500ML ATS (WOUND CARE) ×2 IMPLANT
CEMENT HV SMART SET (Cement) ×4 IMPLANT
CHLORAPREP W/TINT 26 (MISCELLANEOUS) ×4 IMPLANT
COMP FEMORAL LT SZ 5P SPHERE (Femur) ×2 IMPLANT
COOLER POLAR GLACIER W/PUMP (MISCELLANEOUS) ×2 IMPLANT
CUFF TOURN SGL QUICK 24 (TOURNIQUET CUFF)
CUFF TOURN SGL QUICK 34 (TOURNIQUET CUFF)
CUFF TRNQT CYL 24X4X16.5-23 (TOURNIQUET CUFF) IMPLANT
CUFF TRNQT CYL 34X4.125X (TOURNIQUET CUFF) IMPLANT
DRAPE 3/4 80X56 (DRAPES) ×4 IMPLANT
DRSG MEPILEX SACRM 8.7X9.8 (GAUZE/BANDAGES/DRESSINGS) ×2 IMPLANT
ELECT CAUTERY BLADE 6.4 (BLADE) ×2 IMPLANT
ELECT REM PT RETURN 9FT ADLT (ELECTROSURGICAL) ×2
ELECTRODE REM PT RTRN 9FT ADLT (ELECTROSURGICAL) ×1 IMPLANT
FEMUR BONE MODEL 4.9010 MEDACT (MISCELLANEOUS) ×2 IMPLANT
GAUZE 4X4 16PLY ~~LOC~~+RFID DBL (SPONGE) ×2 IMPLANT
GAUZE SPONGE 4X4 12PLY STRL (GAUZE/BANDAGES/DRESSINGS) ×2 IMPLANT
GAUZE XEROFORM 1X8 LF (GAUZE/BANDAGES/DRESSINGS) ×2 IMPLANT
GLOVE SURG ORTHO LTX SZ8 (GLOVE) ×2 IMPLANT
GLOVE SURG SYN 9.0  PF PI (GLOVE) ×1
GLOVE SURG SYN 9.0 PF PI (GLOVE) ×1 IMPLANT
GLOVE SURG UNDER LTX SZ8 (GLOVE) ×2 IMPLANT
GLOVE SURG UNDER POLY LF SZ9 (GLOVE) ×2 IMPLANT
GOWN SRG 2XL LVL 4 RGLN SLV (GOWNS) ×1 IMPLANT
GOWN STRL NON-REIN 2XL LVL4 (GOWNS) ×1
GOWN STRL REUS W/ TWL LRG LVL3 (GOWN DISPOSABLE) ×1 IMPLANT
GOWN STRL REUS W/ TWL XL LVL3 (GOWN DISPOSABLE) ×1 IMPLANT
GOWN STRL REUS W/TWL LRG LVL3 (GOWN DISPOSABLE) ×1
GOWN STRL REUS W/TWL XL LVL3 (GOWN DISPOSABLE) ×1
HOLDER FOLEY CATH W/STRAP (MISCELLANEOUS) ×2 IMPLANT
HOOD PEEL AWAY FLYTE STAYCOOL (MISCELLANEOUS) ×4 IMPLANT
INSERT TIBIAL FIXED SZ5 LT 11M (Insert) ×2 IMPLANT
IRRIGATION SURGIPHOR STRL (IV SOLUTION) IMPLANT
IV NS IRRIG 3000ML ARTHROMATIC (IV SOLUTION) ×2 IMPLANT
KIT PREVENA INCISION MGT20CM45 (CANNISTER) ×2 IMPLANT
KIT TURNOVER KIT A (KITS) ×2 IMPLANT
MANIFOLD NEPTUNE II (INSTRUMENTS) ×2 IMPLANT
NDL SAFETY ECLIPSE 18X1.5 (NEEDLE) ×1 IMPLANT
NEEDLE HYPO 18GX1.5 SHARP (NEEDLE) ×1
NEEDLE SPNL 18GX3.5 QUINCKE PK (NEEDLE) ×2 IMPLANT
NEEDLE SPNL 20GX3.5 QUINCKE YW (NEEDLE) ×2 IMPLANT
NS IRRIG 1000ML POUR BTL (IV SOLUTION) ×2 IMPLANT
PACK TOTAL KNEE (MISCELLANEOUS) ×2 IMPLANT
PAD WRAPON POLAR KNEE (MISCELLANEOUS) ×1 IMPLANT
PATELLA RESURFACING MEDACTA SZ (Bone Implant) ×2 IMPLANT
PENCIL SMOKE EVACUATOR COATED (MISCELLANEOUS) ×2 IMPLANT
PULSAVAC PLUS IRRIG FAN TIP (DISPOSABLE) ×2
SCALPEL PROTECTED #10 DISP (BLADE) ×4 IMPLANT
SPONGE T-LAP 18X18 ~~LOC~~+RFID (SPONGE) ×6 IMPLANT
STAPLER SKIN PROX 35W (STAPLE) ×2 IMPLANT
STEM EXTENSION 11MMX30MM (Stem) ×2 IMPLANT
SUCTION FRAZIER HANDLE 10FR (MISCELLANEOUS) ×1
SUCTION TUBE FRAZIER 10FR DISP (MISCELLANEOUS) ×1 IMPLANT
SUT DVC 2 QUILL PDO  T11 36X36 (SUTURE) ×1
SUT DVC 2 QUILL PDO T11 36X36 (SUTURE) ×1 IMPLANT
SUT ETHIBOND 2 V 37 (SUTURE) IMPLANT
SUT V-LOC 90 ABS DVC 3-0 CL (SUTURE) ×2 IMPLANT
SYR 20ML LL LF (SYRINGE) ×2 IMPLANT
SYR 50ML LL SCALE MARK (SYRINGE) ×4 IMPLANT
TIB TRAY FIXED SZ5 L (Joint) ×2 IMPLANT
TIBIAL BONE MODEL LEFT (MISCELLANEOUS) ×2 IMPLANT
TIP FAN IRRIG PULSAVAC PLUS (DISPOSABLE) ×1 IMPLANT
TOWEL OR 17X26 4PK STRL BLUE (TOWEL DISPOSABLE) ×2 IMPLANT
TOWER CARTRIDGE SMART MIX (DISPOSABLE) ×2 IMPLANT
TRAY FOLEY MTR SLVR 16FR STAT (SET/KITS/TRAYS/PACK) ×2 IMPLANT
WRAPON POLAR PAD KNEE (MISCELLANEOUS) ×2

## 2021-06-08 NOTE — Progress Notes (Signed)
PT Cancellation Note  Patient Details Name: Zachary Martinez MRN: [id number] DOB: 1941-08-28   Cancelled Treatment:    Reason Eval/Treat Not Completed: Fatigue/lethargy limiting ability to participate.  PT consult received.  Chart reviewed.  Upon PT arrival to pt's room, pt's nurse present and reporting pt was really tired--pt had just returned to bed from using [facility].  Pt reporting being really tired and wanting to rest at this time.  Will re-attempt PT evaluation later this afternoon.  Leitha Bleak, PT 06/08/21, 1:49 PM

## 2021-06-08 NOTE — H&P (Signed)
Chief Complaint: No chief complaint on file.  Zachary Martinez is a 80 y.o. male who presents today for history and physical for left total knee arthroplasty with Dr. Hessie Knows on 06/08/2021. Patient had a traumatic injury to the left knee years ago, had a tibial plateau fracture with displacement and underwent ORIF. He recently underwent hardware removal. His wounds have healed and he is ready to proceed with left total knee arthroplasty.  Patient also complaining of right knee pain along the medial joint line. Has right knee osteoarthritis with near complete loss of joint space in the medial compartment of the right knee.  Past Medical History: Past Medical History:  Diagnosis Date   CAD (coronary artery disease)   Diabetes mellitus type 2, uncomplicated (CMS-HCC)   Hyperlipidemia   Hypertension   Sleep apnea   Past Surgical History: Past Surgical History:  Procedure Laterality Date   ADENOIDECTOMY   Arthroscopic superior labrum anterior and posterior repair, arthroscopic debridement, arthroscopic subacromial decompression, mini open rotator cuff repair, and mini open biceps tenodesis, right shoulder Right 01/01/2015   CYSTECTOMY   femoral condyle   KNEE ARTHROSCOPY 02/29/2008  Right   KNEE ARTHROSCOPY 04/20/2012  left   Left lateral tibial plateau fracture arthroscopy with open reduction and internal fixation   Screw removal from left proximal tibia 04/01/2021   Sleep apnea surgery   TONSILLECTOMY   Past Family History: Family History  Problem Relation Age of Onset   Colon cancer Other   Myocardial Infarction (Heart attack) Father   Medications: Current Outpatient Medications Ordered in Epic  Medication Sig Dispense Refill   amLODIPine (NORVASC) 10 MG tablet Take 10 mg by mouth once daily.   atorvastatin (LIPITOR) 40 MG tablet Take 40 mg by mouth once daily.   calcium carbonate-vitamin D3 (OS-CAL 500+D) 500 mg-10 mcg (400 unit) tablet Take 1 tablet by mouth 2 (two) times  daily with meals   canagliflozin-metformin 150-1,000 mg Tab Take 1 tablet by mouth 2 (two) times daily   carvedilol (COREG) 6.25 MG tablet Take 6.25 mg by mouth 2 (two) times daily with meals.   cholecalciferol (VITAMIN D3) 1000 unit capsule Take 1 capsule by mouth once daily   cyclobenzaprine (FLEXERIL) 5 MG tablet Take 5 mg by mouth 3 (three) times daily as needed   docosahexaenoic acid-epa (FISH OIL) 120-180 mg Cap Take 1 tablet by mouth once daily   EPINEPHrine (EPIPEN) 0.3 mg/0.3 mL auto-injector Inject into the muscle as needed   EUA molnupiravir 200 mg capsule Take 1 capsule by mouth as directed   hydrALAZINE (APRESOLINE) 100 MG tablet Take 1 tablet (100 mg total) by mouth 3 (three) times daily (Patient taking differently: Take 100 mg by mouth 2 (two) times daily ) 270 tablet 4   HYDROcodone-acetaminophen (NORCO) 5-325 mg tablet Take 1 tablet by mouth every 6 (six) hours as needed   levothyroxine (SYNTHROID, LEVOTHROID) 88 MCG tablet Take 88 mcg by mouth once daily. Take on an empty stomach with a glass of water at least 30-60 minutes before breakfast.   pioglitazone (ACTOS) 30 MG tablet Take 30 mg by mouth once daily.   prednisoLONE acetate (PRED FORTE) 1 % ophthalmic suspension Apply to eye as directed   Current Facility-Administered Medications Ordered in Epic  Medication Dose Route Frequency Provider Last Rate Last Admin   bupivacaine HCl (MARCAINE) 0.5 % injection 3 mL 3 mL Intra-articular Once Feliberto Gottron, PA   lidocaine (XYLOCAINE) 1 % injection 3 mL 3 mL Intra-articular Once  Feliberto Gottron, PA   triamcinolone acetonide [facility]) injection 40 mg 40 mg Intra-articular Once Feliberto Gottron, [name]   Allergies: Allergies  Allergen Reactions   Ace Inhibitors Anaphylaxis    Review of Systems:  A comprehensive 14 point ROS was performed, reviewed by me today, and the pertinent orthopaedic findings are documented in the HPI.  Exam: There were  no vitals taken for this visit. General:  Well developed, well nourished, no apparent distress, normal affect, mild antalgic gait with no assistive devices  HEENT: Head normocephalic, atraumatic, PERRL.   Abdomen: Soft, non tender, non distended, Bowel sounds present.  Heart: Examination of the heart reveals regular, rate, and rhythm. There is no murmur noted on ascultation. There is a normal apical pulse.  Lungs: Lungs are clear to auscultation. There is no wheeze, rhonchi, or crackles. There is normal expansion of bilateral chest walls.   Right knee: Right knee is tender along the medial joint line nontender along the lateral joint line with mild swelling, 0 to 110 degrees range of motion.  Left knee: Moderate effusion with tenderness on the lateral joint line. Slight valgus deformity. 5 to 95 degrees range of motion with patellofemoral crepitus. No laxity valgus varus stress testing.  Imaging: X-rays of the left knee show tricompartmental osteoarthritis most severe in the lateral compartment where there is more severe posttraumatic degenerative changes with the lateral tibial plateau plate present.  Impression: Primary osteoarthritis of right knee [M17.11] Primary osteoarthritis of right knee (primary encounter diagnosis) Primary osteoarthritis of left knee  Plan:  82. 80 year old male with left knee posttraumatic osteoarthritis. Risks, benefits, complications of a left total knee arthroplasty have discussed with the patient. Patient has agreed and consented the procedure with Dr. Hessie Knows on 06/08/2021  2. Patient agreed and consented to a right knee intra-articular cortisone injection for his right knee osteoarthritis.  Right Knee Joint Cortisone Injection Procedure: Consent  After discussing the various treatment options for the condition, It was agreed that a knee cortisone injection would be the next step in treatment. The nature of and the indications for a  corticosteroid and / or local anaesthetic injection were reviewed in detail with the patient today. The inherent risks of injection including infection, allergic reaction, increased pain, incomplete relief or temporary relief of symptoms, alterations of blood glucose levels requiring careful monitoring and treatment as indicated, tendon, ligament or articular cartilage rupture or degeneration, nerve injury, skin depigmentation, and/or fat atrophy were discussed.  Procedure  After the risks and benefits of the procedure were explained, consent was given, and time-out was performed. The site for the injection was properly marked and prepped with Chlorhexadine solution.  The anterolateral knee injection site was anesthetized with ethyl chloride. The right knee was injected with an arthrocentesis procedure, using 3 cc's 0.25% Marcaine and 1 cc of 40 mg of Kenalog. The procedure was well tolerated.  The patient may proceed with normal function but otherwise rest the joint for a few more days before resuming regular activities. It may be more painful for the first 1-2 days. Watch for fever, or increased swelling or persistent pain in the joint. Call or return to clinic if such symptoms occur or there is failure to improve as anticipated.  Patient was monitored for 20 minutes after injection. Patient remained alert, oriented, feeling well with no complaints. She was stable and ready for discharge home.  This note was generated in part with voice recognition software and I apologize for any typographical errors  that were not detected and corrected.  Feliberto Gottron MPA-C   Electronically signed by Feliberto Gottron, [place] at 05/31/2021 3:50 PM EDT Reviewed  H+P. No changes noted.

## 2021-06-08 NOTE — Anesthesia Preprocedure Evaluation (Signed)
Anesthesia Evaluation  Patient identified by MRN, date of birth, ID band Patient awake    Reviewed: Allergy & Precautions, H&P , NPO status , Patient's Chart, lab work & pertinent test results, reviewed documented beta blocker date and time   History of Anesthesia Complications Negative for: history of anesthetic complications  Airway Mallampati: II  TM Distance: >3 FB Neck ROM: full    Dental  (+) Teeth Intact, Dental Advidsory Given   Pulmonary neg shortness of breath, sleep apnea and Continuous Positive Airway Pressure Ventilation , neg COPD, neg recent URI, former smoker,    Pulmonary exam normal        Cardiovascular Exercise Tolerance: Poor hypertension, On Medications (-) angina+ CAD  (-) Past MI and (-) Cardiac Stents Normal cardiovascular exam(-) dysrhythmias (-) Valvular Problems/Murmurs Rate:Normal     Neuro/Psych neg Seizures  Neuromuscular disease negative psych ROS   GI/Hepatic negative GI ROS, Neg liver ROS,   Endo/Other  diabetesHypothyroidism   Renal/GU negative Renal ROS  negative genitourinary   Musculoskeletal   Abdominal   Peds  Hematology negative hematology ROS (+)   Anesthesia Other Findings Past Medical History: No date: Arthritis     Comment:  knees  No date: Bilateral carotid artery disease ([place]) 02/13/2013: CAD (coronary artery disease)     Comment:  3v CAD; no PCI No date: Cataract     Comment:  removed right eye. left eye forming  No date: Diverticulosis No date: Hyperlipidemia No date: Hypertension No date: Hypothyroidism No date: LVH (left ventricular hypertrophy) No date: Neuropathy No date: OSA on CPAP No date: T2DM (type 2 diabetes mellitus) ([place]) No date: Valvular insufficiency     Comment:  a.) moderate MR and TR No date: Venous insufficiency of both lower extremities   Reproductive/Obstetrics negative OB ROS                              Anesthesia Physical  Anesthesia Plan  ASA: 3  Anesthesia Plan: Spinal   Post-op Pain Management:    Induction: Intravenous  PONV Risk Score and Plan: TIVA and Propofol infusion  Airway Management Planned: Natural Airway and Nasal Cannula  Additional Equipment:   Intra-op Plan:   Post-operative Plan:   Informed Consent: I have reviewed the patients History and Physical, chart, labs and discussed the procedure including the risks, benefits and alternatives for the proposed anesthesia with the patient or authorized representative who has indicated his/her understanding and acceptance.       Plan Discussed with: CRNA  Anesthesia Plan Comments:         Anesthesia Quick Evaluation

## 2021-06-08 NOTE — Anesthesia Procedure Notes (Signed)
Date/Time: 06/08/2021 7:33 AM Performed by: Lily Peer, Zamari Vea, CRNA Pre-anesthesia Checklist: Patient identified, Emergency Drugs available, Suction available and Patient being monitored Patient Re-evaluated:Patient Re-evaluated prior to induction Oxygen Delivery Method: Simple face mask Induction Type: IV induction

## 2021-06-08 NOTE — Transfer of Care (Signed)
Immediate Anesthesia Transfer of Care Note  Patient: Zachary Martinez  Procedure(s) Performed: TOTAL KNEE ARTHROPLASTY (Left: Knee)  Patient Location: PACU  Anesthesia Type:Spinal  Level of Consciousness: awake, alert  and oriented  Airway & Oxygen Therapy: Patient Spontanous Breathing and Patient connected to face mask oxygen  Post-op Assessment: Report given to RN and Post -op Vital signs reviewed and stable  Post vital signs: Reviewed and stable  Last Vitals:  Vitals Value Taken Time  BP 124/58 06/08/21 0934  Temp    Pulse 54 06/08/21 0935  Resp 20 06/08/21 0935  SpO2 99 % 06/08/21 0935  Vitals shown include unvalidated device data.  Last Pain:  Vitals:   06/08/21 0625  TempSrc: Temporal  PainSc: 0-No pain         Complications: No notable events documented.

## 2021-06-08 NOTE — Evaluation (Signed)
Physical Therapy Evaluation Patient Details Name: Zachary Martinez MRN: [id number] DOB: 11/18/1940 Today's Date: 06/08/2021   History of Present Illness  Pt admitted for L TKR. History includes CAD, DM, HTN, L tibial plateau fx in 6/22.  Clinical Impression  Pt is a pleasant 80 year old male who was admitted for L TKR. Pt performs bed mobility, transfers, and ambulation with cga. Pt demonstrates ability to perform 10 SLRs with independence, therefore does not require KI for mobility. Pt demonstrates deficits with strength/mobility/pain. Would benefit from skilled PT to address above deficits and promote optimal return to PLOF. Recommend transition to [facility] upon discharge from acute hospitalization.     Follow Up Recommendations Home health PT    Equipment Recommendations  None recommended by PT    Recommendations for Other Services       Precautions / Restrictions Precautions Precautions: Knee;Fall Precaution Booklet Issued: No Restrictions Weight Bearing Restrictions: Yes LLE Weight Bearing: Weight bearing as tolerated      Mobility  Bed Mobility Overal bed mobility: Needs Assistance Bed Mobility: Supine to Sit     Supine to sit: Min guard     General bed mobility comments: assist for surgical leg    Transfers Overall transfer level: Needs assistance Equipment used: Rolling walker (2 wheeled) Transfers: Sit to/from Stand Sit to Stand: Min guard         General transfer comment: stands prior to RW being present. Good WBing on surgical LE. Upright posture. Adjusted home RW  Ambulation/Gait Ambulation/Gait assistance: Min guard Gait Distance (Feet): 5 Feet Assistive device: Rolling walker (2 wheeled) Gait Pattern/deviations: Step-to pattern     General Gait Details: ambulated with RW with safe technique over to recliner  Stairs            Wheelchair Mobility    Modified Rankin (Stroke Patients Only)       Balance Overall balance assessment: Needs  assistance Sitting-balance support: Bilateral upper extremity supported Sitting balance-Leahy Scale: Good     Standing balance support: Bilateral upper extremity supported Standing balance-Leahy Scale: Good                               Pertinent Vitals/Pain Pain Assessment: Faces Faces Pain Scale: Hurts little more Pain Location: L quad Pain Descriptors / Indicators: Sore Pain Intervention(s): Limited activity within patient's tolerance;Ice applied    Home Living Family/patient expects to be discharged to:: Private residence Living Arrangements: Spouse/significant other Available Help at Discharge: Family;Available 24 hours/day Type of Home: House Home Access: Stairs to enter Entrance Stairs-Rails:  (has R side post) Entrance Stairs-Number of Steps: 1+2 Home Layout: Able to live on main level with bedroom/bathroom;Multi-level Home Equipment: Walker - 2 wheels;Bedside commode      Prior Function Level of Independence: Independent         Comments: reports no falls, indep prior     Hand Dominance        Extremity/Trunk Assessment   Upper Extremity Assessment Upper Extremity Assessment: Overall WFL for tasks assessed    Lower Extremity Assessment Lower Extremity Assessment: Generalized weakness (L LE grossly 3/5; R LE grossly 5/5)       Communication   Communication: No difficulties  Cognition Arousal/Alertness: Awake/alert Behavior During Therapy: WFL for tasks assessed/performed Overall Cognitive Status: Within Functional Limits for tasks assessed  General Comments      Exercises Total Joint Exercises Goniometric ROM: L knee AAROM: 0-70 degrees Other Exercises Other Exercises: Seated ther-ex performed on L LE including AP, quad sets, SLRs, hip abd/add, and seated knee flexion. 10 reps   Assessment/Plan    PT Assessment Patient needs continued PT services  PT Problem List  Decreased strength;Decreased range of motion;Decreased mobility;Pain       PT Treatment Interventions DME instruction;Gait training;Stair training;Therapeutic exercise    PT Goals (Current goals can be found in the Care Plan section)  Acute Rehab PT Goals Patient Stated Goal: to go home PT Goal Formulation: With patient Time For Goal Achievement: 06/22/21 Potential to Achieve Goals: Good    Frequency BID   Barriers to discharge        Co-evaluation               AM-PAC PT "6 Clicks" Mobility  Outcome Measure Help needed turning from your back to your side while in a flat bed without using bedrails?: None Help needed moving from lying on your back to sitting on the side of a flat bed without using bedrails?: A Little Help needed moving to and from a bed to a chair (including a wheelchair)?: A Little Help needed standing up from a chair using your arms (e.g., wheelchair or bedside chair)?: A Little Help needed to walk in hospital room?: A Little Help needed climbing 3-5 steps with a railing? : A Lot 6 Click Score: 18    End of Session Equipment Utilized During Treatment: Gait belt Activity Tolerance: Patient tolerated treatment well Patient left: in chair;with chair alarm set;with family/visitor present;with SCD's reapplied Nurse Communication: Mobility status PT Visit Diagnosis: Muscle weakness (generalized) (M62.81);Difficulty in walking, not elsewhere classified (R26.2);Pain Pain - Right/Left: Left Pain - part of body: Knee    Time: 1542-1610 PT Time Calculation (min) (ACUTE ONLY): 28 min   Charges:   PT Evaluation $PT Eval Low Complexity: 1 Low PT Treatments $Therapeutic Exercise: 8-22 mins        [name], PT, DPT [phone]   Nabiha Planck 06/08/2021, 4:47 PM

## 2021-06-08 NOTE — Op Note (Signed)
06/08/2021  9:37 AM  PATIENT:  Zachary Martinez   MRN: [id number]  PRE-OPERATIVE DIAGNOSIS:  Primary localized osteoarthritis of left knee   POST-OPERATIVE DIAGNOSIS:  Same   PROCEDURE:  Procedure(s): Left TOTAL KNEE ARTHROPLASTY   SURGEON: Laurene Footman, MD   ASSISTANTS: Rachelle Hora, PA-C   ANESTHESIA:   spinal   EBL: 100 cc   BLOOD ADMINISTERED:none   DRAINS:  Incisional wound VAC     LOCAL MEDICATIONS USED:  MARCAINE    and OTHER Exparel morphine and Toradol   SPECIMEN:  No Specimen   DISPOSITION OF SPECIMEN:  N/A   COUNTS:  YES   TOURNIQUET: 72 minutes at 300 mm Hg   IMPLANTS: Medacta  GMK sphere system with 5+ left femur, 5 tibia with short stem and 11  mm insert.  Size 3 patella, all components cemented.   DICTATION: Viviann Spare Dictation   patient was brought to the operating room and spinal anesthesia was obtained.  After prepping and draping the left leg in sterile fashion, and after patient identification and timeout procedures were completed, tourniquet was raised  and midline skin incision was made followed by medial parapatellar arthrotomy with moderate medial compartment osteoarthritis, severe patellofemoral arthritis and severe lateral compartment arthritis, partial synovectomy was also carried out.   The ACL and PCL and fat pad were excised along with anterior horns of the meniscus. The proximal tibia cutting guide from  the [facility] system was applied and the proximal tibia cut carried out.  The distal femoral cut was carried out in a similar fashion     The 5+ femoral cutting guide applied with anterior posterior and chamfer cuts made.  The posterior horns of the menisci were removed at this point.  Also the prior screw that had not been removed with prior hardware removal was removed at this point after having remove the tibial bone around it without difficulty.  Injection of the above medication was carried out after the femoral and tibial cuts were carried out.  The 5  baseplate trial was placed pinned into position and proximal tibial preparation carried out with drilling hand reaming and the keel punch followed by placement of the 5+ femur and sizing the tibial insert size 11 millimeter gave the best fit with stability and full extension.  The distal femoral drill holes were made in the notch cut for the trochlear groove was then carried out with trials were then removed the patella was cut using the patellar cutting guide and it sized to a size 3 after drill holes have been made  The knee was irrigated with pulsatile lavage and the bony surfaces dried the tibial component was cemented into place first.  Excess cement was removed and the polyethylene insert placed with a torque screw placed with a torque screwdriver tightened.  The distal femoral component was placed and the knee was held in extension as the patellar button was clamped into place.  After the cement was set, excess cement was removed and the knee was again irrigated thoroughly thoroughly irrigated.  The tourniquet was let down and hemostasis checked with electrocautery. The arthrotomy was repaired with a heavy Quill suture,  followed by 3-0 V lock subcuticular closure, skin staples followed by incisional wound VAC and Polar Care.Marland Kitchen   PLAN OF CARE: Admit to inpatient    PATIENT DISPOSITION:  PACU - hemodynamically stable.

## 2021-06-08 NOTE — Anesthesia Procedure Notes (Signed)
Spinal  Patient location during procedure: OR Start time: 06/08/2021 7:21 AM End time: 06/08/2021 7:24 AM Reason for block: surgical anesthesia Staffing Performed: resident/CRNA  Anesthesiologist: Martha Clan, MD Resident/CRNA: Norm Salt, CRNA Preanesthetic Checklist Completed: patient identified, IV checked, site marked, risks and benefits discussed, surgical consent, monitors and equipment checked and pre-op evaluation Spinal Block Patient position: sitting Prep: ChloraPrep Patient monitoring: heart rate, continuous pulse ox and blood pressure Approach: midline Location: L3-4 Injection technique: single-shot Needle Needle type: Pencan  Needle gauge: 24 G Needle length: 10 cm Assessment Events: CSF return Additional Notes IV functioning, monitors applied to pt. Expiration date of kit checked and confirmed to be in date. Sterile prep and drape, hand hygiene and sterile gloved used. Pt was positioned and spine was prepped in sterile fashion. Skin was anesthetized with lidocaine. Free flow of clear CSF obtained prior to injecting local anesthetic into CSF x 1 attempt. Spinal needle aspirated freely following injection. Needle was carefully withdrawn, and pt tolerated procedure well. Loss of motor and sensory on exam post injection.

## 2021-06-09 LAB — CBC
HCT: 35.3 % — ABNORMAL LOW (ref 39.0–52.0)
Hemoglobin: 11.6 g/dL — ABNORMAL LOW (ref 13.0–17.0)
MCH: 30.9 pg (ref 26.0–34.0)
MCHC: 32.9 g/dL (ref 30.0–36.0)
MCV: 94.1 fL (ref 80.0–100.0)
Platelets: 138 10*3/uL — ABNORMAL LOW (ref 150–400)
RBC: 3.75 MIL/uL — ABNORMAL LOW (ref 4.22–5.81)
RDW: 15.5 % (ref 11.5–15.5)
WBC: 5.5 10*3/uL (ref 4.0–10.5)
nRBC: 0 % (ref 0.0–0.2)

## 2021-06-09 LAB — BASIC METABOLIC PANEL
Anion gap: 5 (ref 5–15)
BUN: 19 mg/dL (ref 8–23)
CO2: 27 mmol/L (ref 22–32)
Calcium: 9.2 mg/dL (ref 8.9–10.3)
Chloride: 104 mmol/L (ref 98–111)
Creatinine, Ser: 0.81 mg/dL (ref 0.61–1.24)
GFR, Estimated: >60 mL/min (ref >60)
Glucose, Bld: 177 mg/dL — ABNORMAL HIGH (ref 70–99)
Potassium: 3.7 mmol/L (ref 3.5–5.1)
Sodium: 136 mmol/L (ref 135–145)

## 2021-06-09 LAB — GLUCOSE, CAPILLARY
Glucose-Capillary: 187 mg/dL — ABNORMAL HIGH (ref 70–99)
Glucose-Capillary: 260 mg/dL — ABNORMAL HIGH (ref 70–99)
Glucose-Capillary: 180 mg/dL — ABNORMAL HIGH (ref 70–99)
Glucose-Capillary: 172 mg/dL — ABNORMAL HIGH (ref 70–99)

## 2021-06-09 NOTE — Progress Notes (Signed)
   Subjective: 1 Day Post-Op Procedure(s) (LRB): TOTAL KNEE ARTHROPLASTY (Left) Patient reports pain as 1 on 0-10 scale.   Patient is well, and has had no acute complaints or problems Denies any CP, SOB, ABD pain. We will continue therapy today.  Plan is to go Home after hospital stay.  Objective: Vital signs in last 24 hours: Temp:  [97.5 F (36.4 C)-98.4 F (36.9 C)] 98.2 F (36.8 C) (08/10 0738) Pulse Rate:  [46-54] 52 (08/10 0738) Resp:  [16-20] 19 (08/10 0738) BP: (117-167)/(56-65) 159/58 (08/10 0738) SpO2:  [90 %-99 %] 97 % (08/10 0738)  Intake/Output from previous day: 08/09 0701 - 08/10 0700 In: 1390 [P.O.:240; I.V.:950; IV Piggyback:200] Out: 8280 [Urine:1550; Blood:100] Intake/Output this shift: No intake/output data recorded.  Recent Labs    06/08/21 1249 06/09/21 0439  HGB 12.8* 11.6*   Recent Labs    06/08/21 1249 06/09/21 0439  WBC 6.7 5.5  RBC 4.02* 3.75*  HCT 38.5* 35.3*  PLT 170 138*   Recent Labs    06/08/21 1249 06/09/21 0439  NA  --  136  K  --  3.7  CL  --  104  CO2  --  27  BUN  --  19  CREATININE 0.98 0.81  GLUCOSE  --  177*  CALCIUM  --  9.2   No results for input(s): LABPT, INR in the last 72 hours.  EXAM General - Patient is Alert, Appropriate, and Oriented Extremity - Neurovascular intact Sensation intact distally Intact pulses distally Incision: dressing C/D/I and no drainage No cellulitis present Compartment soft Dressing - dressing C/D/I, prevena intact with out drainage Motor Function - intact, moving foot and toes well on exam.   Past Medical History:  Diagnosis Date   Arthritis    knees    Bilateral carotid artery disease ([facility])    CAD (coronary artery disease) 02/13/2013   3v CAD; no PCI   Cataract    removed right eye. left eye forming    Diverticulosis    Hyperlipidemia    Hypertension    Hypothyroidism    LVH (left ventricular hypertrophy)    Neuropathy    OSA on CPAP    T2DM (type 2 diabetes  mellitus) ([place])    Valvular insufficiency    a.) moderate MR and TR   Venous insufficiency of both lower extremities     Assessment/Plan:   1 Day Post-Op Procedure(s) (LRB): TOTAL KNEE ARTHROPLASTY (Left) Active Problems:   S/P TKR (total knee replacement) using cement, left  Estimated body mass index is 35.44 kg/m as calculated from the following:   Height as of this encounter: 5\' 9"  (1.753 m).   Weight as of this encounter: 108.9 kg. Advance diet Up with therapy Work on BM Pain controlled VSS  Labs stable CM to assist with discharge to home with HHPT  DVT Prophylaxis - Lovenox, Foot Pumps, and TED hose Weight-Bearing as tolerated to left leg   T. Rachelle Hora, PA-C [place] 06/09/2021, 8:11 AM

## 2021-06-09 NOTE — Anesthesia Postprocedure Evaluation (Signed)
Anesthesia Post Note  Patient: Zachary Martinez  Procedure(s) Performed: TOTAL KNEE ARTHROPLASTY (Left: Knee)  Patient location during evaluation: Nursing Unit Anesthesia Type: Spinal Level of consciousness: oriented and awake and alert Pain management: pain level controlled Vital Signs Assessment: post-procedure vital signs reviewed and stable Respiratory status: spontaneous breathing, respiratory function stable and patient connected to nasal cannula oxygen Cardiovascular status: blood pressure returned to baseline and stable Postop Assessment: no headache, no backache and no apparent nausea or vomiting Anesthetic complications: no   No notable events documented.   Last Vitals:  Vitals:   06/09/21 0428 06/09/21 0738  BP: (!) 167/62 (!) 159/58  Pulse: (!) 52 (!) 52  Resp: 20 19  Temp: 36.6 C 36.8 C  SpO2: 96% 97%    Last Pain:  Vitals:   06/09/21 0738  TempSrc: Oral  PainSc:                  Estill Batten

## 2021-06-09 NOTE — Progress Notes (Signed)
Met with the patient at the bedside He lives at home with his wife Has transportation and can afford his meds He is set up with [facility] for HH services, he has DM at home and does not need more 

## 2021-06-09 NOTE — Progress Notes (Signed)
Physical Therapy Treatment Patient Details Name: Zachary Martinez MRN: [id number] DOB: [birth year] Today's Date: 06/09/2021    History of Present Illness Pt admitted for L TKR. History includes CAD, DM, HTN, L tibial plateau fx in 6/22.    PT Comments    Pt is making good progress towards goals. Still complains of soreness this date specifically L quad. Pain meds given prior to session. Cues for RW given. HEP given and reviewed with progress with AAROM of surgical LE. Will continue to progress as able.   Follow Up Recommendations  Home health PT     Equipment Recommendations  None recommended by PT    Recommendations for Other Services       Precautions / Restrictions Precautions Precautions: Knee;Fall Precaution Booklet Issued: Yes (comment) Restrictions Weight Bearing Restrictions: Yes LLE Weight Bearing: Weight bearing as tolerated    Mobility  Bed Mobility               General bed mobility comments: not tested as received in recliner    Transfers Overall transfer level: Needs assistance Equipment used: Rolling walker (2 wheeled) Transfers: Sit to/from Stand Sit to Stand: Min guard         General transfer comment: antalgic stand this date with slow transfer. once standing, able to don robe without hand support on RW  Ambulation/Gait Ambulation/Gait assistance: Min guard Gait Distance (Feet): 110 Feet Assistive device: Rolling walker (2 wheeled) Gait Pattern/deviations: Step-to pattern     General Gait Details: ambulated with slow gait speed. Antalgic pattern. RW used and stays too close to RW. Adjusted RW to height. Fatigues quickly   Marine scientist Rankin (Stroke Patients Only)       Balance Overall balance assessment: Needs assistance Sitting-balance support: Bilateral upper extremity supported Sitting balance-Leahy Scale: Good     Standing balance support: Bilateral upper extremity  supported Standing balance-Leahy Scale: Good                              Cognition Arousal/Alertness: Awake/alert Behavior During Therapy: WFL for tasks assessed/performed Overall Cognitive Status: Within Functional Limits for tasks assessed                                        Exercises Total Joint Exercises Goniometric ROM: L knee AAROM: 0-80 degrees Other Exercises Other Exercises: Seated ther-ex performed on L LE including AP, quad sets, SLRs, hip abd/add, SAQ, and seated knee flexion. 12 reps. Written HEP given and reviewed    General Comments        Pertinent Vitals/Pain Pain Assessment: 0-10 Pain Score: 5  Pain Location: L quad Pain Descriptors / Indicators: Sore Pain Intervention(s): Limited activity within patient's tolerance;Premedicated before session;Repositioned;Ice applied    Home Living                      Prior Function            PT Goals (current goals can now be found in the care plan section) Acute Rehab PT Goals Patient Stated Goal: to go home PT Goal Formulation: With patient Time For Goal Achievement: 06/22/21 Potential to Achieve Goals: Good Progress towards PT goals: Progressing toward goals    Frequency  BID      PT Plan Current plan remains appropriate    Co-evaluation              AM-PAC PT "6 Clicks" Mobility   Outcome Measure  Help needed turning from your back to your side while in a flat bed without using bedrails?: None Help needed moving from lying on your back to sitting on the side of a flat bed without using bedrails?: A Little Help needed moving to and from a bed to a chair (including a wheelchair)?: A Little Help needed standing up from a chair using your arms (e.g., wheelchair or bedside chair)?: A Little Help needed to walk in hospital room?: A Little Help needed climbing 3-5 steps with a railing? : A Lot 6 Click Score: 18    End of Session Equipment Utilized  During Treatment: Gait belt Activity Tolerance: Patient tolerated treatment well Patient left: in chair;with chair alarm set Nurse Communication: Mobility status PT Visit Diagnosis: Muscle weakness (generalized) (M62.81);Difficulty in walking, not elsewhere classified (R26.2);Pain Pain - Right/Left: Left Pain - part of body: Knee     Time: 2800-3491 PT Time Calculation (min) (ACUTE ONLY): 33 min  Charges:  $Gait Training: 8-22 mins $Therapeutic Exercise: 8-22 mins                     [name], PT, DPT [phone]    Furman Trentman 06/09/2021, 10:34 AM

## 2021-06-09 NOTE — Plan of Care (Signed)
  Problem: Education: Goal: Knowledge of the prescribed therapeutic regimen will improve Outcome: Progressing   Problem: Activity: Goal: Ability to avoid complications of mobility impairment will improve Outcome: Progressing Goal: Range of joint motion will improve Outcome: Progressing   Problem: Pain Management: Goal: Pain level will decrease with appropriate interventions Outcome: Progressing   Problem: Skin Integrity: Goal: Will show signs of wound healing Outcome: Progressing   Problem: Health Behavior/Discharge Planning: Goal: Ability to manage health-related needs will improve Outcome: Progressing   Problem: Clinical Measurements: Goal: Will remain free from infection Outcome: Progressing Goal: Diagnostic test results will improve Outcome: Progressing Goal: Respiratory complications will improve Outcome: Progressing Goal: Cardiovascular complication will be avoided Outcome: Progressing   Problem: Activity: Goal: Risk for activity intolerance will decrease Outcome: Progressing   Problem: Elimination: Goal: Will not experience complications related to bowel motility Outcome: Progressing Goal: Will not experience complications related to urinary retention Outcome: Progressing   Problem: Pain Managment: Goal: General experience of comfort will improve Outcome: Progressing   Problem: Skin Integrity: Goal: Risk for impaired skin integrity will decrease Outcome: Progressing

## 2021-06-09 NOTE — Progress Notes (Signed)
Physical Therapy Treatment Patient Details Name: Zachary Martinez MRN: [id number] DOB: 03-28-1941 Today's Date: 06/09/2021    History of Present Illness Pt admitted for L TKR. History includes CAD, DM, HTN, L tibial plateau fx in 6/22.    PT Comments    Pt is making good progress towards goals with ability to ambulate further distance in hallway. Fatigues quickly and demonstrates slow gait speed. Safe technique with RW. Good endurance with HEP. LImited by pain. Will continue to progress as able. Still needs stair training.  Follow Up Recommendations  Home health PT     Equipment Recommendations  None recommended by PT    Recommendations for Other Services       Precautions / Restrictions Precautions Precautions: Knee;Fall Precaution Booklet Issued: Yes (comment) Restrictions Weight Bearing Restrictions: Yes LLE Weight Bearing: Weight bearing as tolerated    Mobility  Bed Mobility Overal bed mobility: Needs Assistance Bed Mobility: Supine to Sit     Supine to sit: Min guard     General bed mobility comments: needs guidance for B LEs. Once seated, able to maintain balance    Transfers Overall transfer level: Needs assistance Equipment used: Rolling walker (2 wheeled) Transfers: Sit to/from Stand Sit to Stand: Min guard         General transfer comment: needs bed elevated prior to standing. RW used with good weight acceptance on surigcal leg.  Ambulation/Gait Ambulation/Gait assistance: Min guard Gait Distance (Feet): 150 Feet Assistive device: Rolling walker (2 wheeled) Gait Pattern/deviations: Step-to pattern     General Gait Details: short step to gait pattern with decreased step length on R side. RW used. Slow gait speed.   Stairs             Wheelchair Mobility    Modified Rankin (Stroke Patients Only)       Balance Overall balance assessment: Needs assistance Sitting-balance support: Bilateral upper extremity supported Sitting  balance-Leahy Scale: Good     Standing balance support: Bilateral upper extremity supported Standing balance-Leahy Scale: Good                              Cognition Arousal/Alertness: Awake/alert Behavior During Therapy: WFL for tasks assessed/performed Overall Cognitive Status: Within Functional Limits for tasks assessed                                        Exercises Other Exercises Other Exercises: supine ther-ex performed on L LE including AP, quad sets, SLRs, LAQ, and seated knee flexion. 12 reps with min/mod assist Other Exercises: ambulated to bathroom to void. Able to perform hygiene with CGA. Safe technique    General Comments        Pertinent Vitals/Pain Pain Assessment: 0-10 Pain Score: 7  Pain Location: L quad Pain Descriptors / Indicators: Sore Pain Intervention(s): Limited activity within patient's tolerance;Ice applied;Premedicated before session    Home Living                      Prior Function            PT Goals (current goals can now be found in the care plan section) Acute Rehab PT Goals Patient Stated Goal: to go home PT Goal Formulation: With patient Time For Goal Achievement: 06/22/21 Potential to Achieve Goals: Good Progress towards PT goals: Progressing toward goals  Frequency    BID      PT Plan Current plan remains appropriate    Co-evaluation              AM-PAC PT "6 Clicks" Mobility   Outcome Measure  Help needed turning from your back to your side while in a flat bed without using bedrails?: None Help needed moving from lying on your back to sitting on the side of a flat bed without using bedrails?: A Little Help needed moving to and from a bed to a chair (including a wheelchair)?: A Little Help needed standing up from a chair using your arms (e.g., wheelchair or bedside chair)?: A Little Help needed to walk in hospital room?: A Little Help needed climbing 3-5 steps with a  railing? : A Lot 6 Click Score: 18    End of Session Equipment Utilized During Treatment: Gait belt Activity Tolerance: Patient tolerated treatment well Patient left: in bed;with bed alarm set;with SCD's reapplied;with family/visitor present Nurse Communication: Mobility status PT Visit Diagnosis: Muscle weakness (generalized) (M62.81);Difficulty in walking, not elsewhere classified (R26.2);Pain Pain - Right/Left: Left Pain - part of body: Knee     Time: 4462-8638 PT Time Calculation (min) (ACUTE ONLY): 45 min  Charges:  $Gait Training: 8-22 mins $Therapeutic Exercise: 8-22 mins $Therapeutic Activity: 8-22 mins                     [name], PT, DPT [phone]    [name] 06/09/2021, 4:39 PM

## 2021-06-10 LAB — TYPE AND SCREEN
ABO/RH(D): O POS
Antibody Screen: POSITIVE
Unit division: 0
Unit division: 0

## 2021-06-10 LAB — BPAM RBC
Blood Product Expiration Date: 202209092359
Blood Product Expiration Date: 202209092359
Unit Type and Rh: 5100
Unit Type and Rh: 5100

## 2021-06-10 LAB — GLUCOSE, CAPILLARY
Glucose-Capillary: 190 mg/dL — ABNORMAL HIGH (ref 70–99)
Glucose-Capillary: 175 mg/dL — ABNORMAL HIGH (ref 70–99)

## 2021-06-10 MED ORDER — ENOXAPARIN SODIUM 40 MG/0.4ML IJ SOSY: 40.0000 mg | PREFILLED_SYRINGE | INTRAMUSCULAR | 0 refills | Status: DC

## 2021-06-10 MED ORDER — METHOCARBAMOL 500 MG PO TABS: 500.0000 mg | ORAL_TABLET | Freq: Four times a day (QID) | ORAL | 0 refills | Status: DC | PRN

## 2021-06-10 MED ORDER — TRAMADOL HCL 50 MG PO TABS: 50.0000 mg | ORAL_TABLET | Freq: Four times a day (QID) | ORAL | 0 refills | Status: DC | PRN

## 2021-06-10 MED ORDER — HYDROCODONE-ACETAMINOPHEN 5-325 MG PO TABS: 1.0000 | ORAL_TABLET | Freq: Four times a day (QID) | ORAL | 0 refills | Status: DC | PRN

## 2021-06-10 MED ORDER — DOCUSATE SODIUM 100 MG PO CAPS: 100.0000 mg | ORAL_CAPSULE | Freq: Two times a day (BID) | ORAL | 0 refills | Status: DC

## 2021-06-10 NOTE — Discharge Summary (Signed)
Physician Discharge Summary  Patient ID: Zachary Martinez MRN: [id number] DOB/AGE: 04-24-1941 80 y.o.  Admit date: 06/08/2021 Discharge date: 06/10/2021  Admission Diagnoses:  S/P TKR (total knee replacement) using cement, left [Z96.652]   Discharge Diagnoses: Patient Active Problem List   Diagnosis Date Noted   S/P TKR (total knee replacement) using cement, left 06/08/2021   Bradycardia 10/30/2018   Venous insufficiency of both lower extremities 06/02/2017   Hypertension 06/07/2016   Type 2 diabetes mellitus ([place]) 10/08/2015   Abnormal chest x-ray 05/09/2015   CAD in native artery 05/09/2015   Benign fibroma of prostate 05/09/2015   Arteriosclerosis of coronary artery 05/09/2015   Diabetes mellitus with polyneuropathy ([place]) 05/09/2015   Diverticulosis of colon 05/09/2015   ED (erectile dysfunction) of organic origin 05/09/2015   Family history of colon cancer 05/09/2015   HLD (hyperlipidemia) 05/09/2015   Adult hypothyroidism 05/09/2015   Adiposity 05/09/2015   Arthritis, degenerative 05/09/2015   Parathyroid adenoma 05/09/2015   Apnea, sleep 05/09/2015   Disorder of bursae and tendons in shoulder region 12/22/2014   Hypertensive left ventricular hypertrophy 11/24/2014   MI (mitral incompetence) 11/24/2014   Obstructive apnea 11/24/2014    Past Medical History:  Diagnosis Date   Arthritis    knees    Bilateral carotid artery disease (HCC)    CAD (coronary artery disease) 02/13/2013   3v CAD; no PCI   Cataract    removed right eye. left eye forming    Diverticulosis    Hyperlipidemia    Hypertension    Hypothyroidism    LVH (left ventricular hypertrophy)    Neuropathy    OSA on CPAP    T2DM (type 2 diabetes mellitus) ([place])    Valvular insufficiency    a.) moderate MR and TR   Venous insufficiency of both lower extremities      Transfusion: none   Consultants (if any):   Discharged Condition: Improved  Hospital Course: Zachary Martinez is an 80 y.o. male who  was admitted 06/08/2021 with a diagnosis of  left knee osteoarthritis and went to the operating room on 06/08/2021 and underwent the above named procedures.    Surgeries: Procedure(s): TOTAL KNEE ARTHROPLASTY on 06/08/2021 Patient tolerated the surgery well. Taken to PACU where she was stabilized and then transferred to the orthopedic floor.  Started on Lovenox 30 mg q 12 hrs. Foot pumps applied bilaterally at 80 mm. Heels elevated on bed with rolled towels. No evidence of DVT. Negative Homan. Physical therapy started on day #1 for gait training and transfer. OT started day #1 for ADL and assisted devices.  Patient's foley was d/c on day #1. Patient's IV  was d/c on day #2.  On post op day #2 patient was stable and ready for discharge to home with HHPT.    He was given perioperative antibiotics:  Anti-infectives (From admission, onward)    Start     Dose/Rate Route Frequency Ordered Stop   06/08/21 1030  ceFAZolin (ANCEF) IVPB 2g/100 mL premix        2 g 200 mL/hr over 30 Minutes Intravenous Every 6 hours 06/08/21 1017 06/08/21 1724   06/08/21 0631  ceFAZolin (ANCEF) 2-4 GM/100ML-% IVPB       Note to Pharmacy: Olena Mater   : cabinet override      06/08/21 0631 06/08/21 0730   06/08/21 0600  ceFAZolin (ANCEF) IVPB 2g/100 mL premix        2 g 200 mL/hr over 30 Minutes Intravenous On  call to O.R. 06/08/21 0272 06/08/21 5366     .  He was given sequential compression devices, early ambulation, and Lovenox TEDs for DVT prophylaxis.  He benefited maximally from the hospital stay and there were no complications.    Recent vital signs:  Vitals:   06/10/21 0456 06/10/21 0750  BP: (!) 158/68 (!) 158/63  Pulse: 62 60  Resp: 17 18  Temp: 98.1 F (36.7 C) 97.9 F (36.6 C)  SpO2: 93% 97%    Recent laboratory studies:  Lab Results  Component Value Date   HGB 11.6 (L) 06/09/2021   HGB 12.8 (L) 06/08/2021   HGB 13.2 05/28/2021   Lab Results  Component Value Date   WBC 5.5  06/09/2021   PLT 138 (L) 06/09/2021   No results found for: INR Lab Results  Component Value Date   NA 136 06/09/2021   K 3.7 06/09/2021   CL 104 06/09/2021   CO2 27 06/09/2021   BUN 19 06/09/2021   CREATININE 0.81 06/09/2021   GLUCOSE 177 (H) 06/09/2021    Discharge Medications:   Allergies as of 06/10/2021       Reactions   Ace Inhibitors Anaphylaxis, Other (See Comments)        Medication List     TAKE these medications    amLODipine 10 MG tablet Commonly known as: NORVASC TAKE 1 TABLET EVERY DAY What changed: when to take this   atorvastatin 40 MG tablet Commonly known as: LIPITOR TAKE 1 TABLET EVERY DAY What changed: when to take this   carvedilol 6.25 MG tablet Commonly known as: COREG TAKE 1 TABLET TWICE DAILY What changed: when to take this   docusate sodium 100 MG capsule Commonly known as: COLACE Take 1 capsule (100 mg total) by mouth 2 (two) times daily.   enoxaparin 40 MG/0.4ML injection Commonly known as: LOVENOX Inject 0.4 mLs (40 mg total) into the skin daily for 14 days.   EPINEPHrine 0.3 mg/0.3 mL Soaj injection Commonly known as: EPI-PEN Inject 0.3 mg into the muscle as needed for anaphylaxis.   Fish Oil 1000 MG Caps Take 1,000 mg by mouth daily.   hydrALAZINE 100 MG tablet Commonly known as: APRESOLINE TAKE 1 TABLET TWICE DAILY   HYDROcodone-acetaminophen 5-325 MG tablet Commonly known as: NORCO/VICODIN Take 1-2 tablets by mouth every 6 (six) hours as needed for moderate pain (pain score 4-6).   Invokamet [phone] MG Tabs Generic drug: Canagliflozin-metFORMIN HCl Take 1 tablet by mouth 2 (two) times daily.   levothyroxine 88 MCG tablet Commonly known as: SYNTHROID TAKE 1 TABLET EVERY DAY What changed: when to take this   methocarbamol 500 MG tablet Commonly known as: ROBAXIN Take 1 tablet (500 mg total) by mouth every 6 (six) hours as needed for muscle spasms.   pioglitazone 30 MG tablet Commonly known as: ACTOS TAKE  1 TABLET EVERY DAY What changed: when to take this   traMADol 50 MG tablet Commonly known as: ULTRAM Take 1 tablet (50 mg total) by mouth every 6 (six) hours as needed.   Vitamin D3 50 MCG (2000 UT) capsule Take 2,000 Units by mouth daily.               Durable Medical Equipment  (From admission, onward)           Start     Ordered   06/08/21 1018  DME Walker rolling  Once       Question Answer Comment  Walker: With [place]  Patient needs a walker to treat with the following condition S/P TKR (total knee replacement) using cement, left      06/08/21 1017   06/08/21 1018  DME 3 n 1  Once        06/08/21 1017   06/08/21 1018  DME Bedside commode  Once       Question:  Patient needs a bedside commode to treat with the following condition  Answer:  S/P TKR (total knee replacement) using cement, left   06/08/21 1017            Diagnostic Studies: DG Knee 1-2 Views Left  Result Date: 06/08/2021 CLINICAL DATA:  80 year old male status post knee arthroplasty. EXAM: LEFT KNEE - 1-2 VIEW COMPARISON:  CT of the left knee 04/27/2021. FINDINGS: AP and cross-table lateral views at 1018 hours demonstrate addition of left total knee arthroplasty 2 previous proximal tibia ORIF hardware. Arthroplasty hardware appears intact and normally aligned. Postoperative changes to the undersurface of the patella. Small volume gas and fluid in the joint space. Anterior skin staples. Small volume soft tissue gas in the anterior thigh. No adverse features identified. IMPRESSION: Left total knee arthroplasty with no adverse features. Electronically Signed   By: Genevie Ann M.D.   On: 06/08/2021 10:37    Disposition:      Follow-up Information     Duanne Guess, PA-C Follow up in 2 week(s).   Specialties: Orthopedic Surgery, Emergency Medicine Contact information: [place] [address] [phone]                  Signed: Feliberto Gottron 06/10/2021, 10:08 AM

## 2021-06-10 NOTE — Discharge Instructions (Signed)

## 2021-06-10 NOTE — Progress Notes (Signed)
Physical Therapy Treatment Patient Details Name: Zachary Martinez MRN: [id number] DOB: 1941/03/03 Today's Date: 06/10/2021    History of Present Illness Pt admitted for L TKR. History includes CAD, DM, HTN, L tibial plateau fx in 6/22.    PT Comments    Pt is making good progress towards goals. Continues to mobilize with slow gait speed with cues for gait sequencing. RW used safely. Good endurance with HEP and AAROM. Will need stair training in PM.   Follow Up Recommendations  Home health PT     Equipment Recommendations  None recommended by PT    Recommendations for Other Services       Precautions / Restrictions Precautions Precautions: Knee;Fall Precaution Booklet Issued: Yes (comment) Restrictions Weight Bearing Restrictions: Yes LLE Weight Bearing: Weight bearing as tolerated    Mobility  Bed Mobility               General bed mobility comments: NT, pt received in recliner    Transfers Overall transfer level: Needs assistance Equipment used: Rolling walker (2 wheeled) Transfers: Sit to/from Stand Sit to Stand: Supervision         General transfer comment: safe technique with no cues required for hand placement. Upright posture noted  Ambulation/Gait Ambulation/Gait assistance: Min guard Gait Distance (Feet): 220 Feet Assistive device: Rolling walker (2 wheeled) Gait Pattern/deviations: Step-through pattern     General Gait Details: inconsistent reciprocal gait pattern with cues for foot clearance on R stepping as well as upright posture. Very slow gait speed. Chair follow as patient fatigues very quickly. No seated rest break needed. No increase in pain with mobility   Stairs             Wheelchair Mobility    Modified Rankin (Stroke Patients Only)       Balance Overall balance assessment: Needs assistance Sitting-balance support: Bilateral upper extremity supported Sitting balance-Leahy Scale: Good     Standing balance support:  Bilateral upper extremity supported Standing balance-Leahy Scale: Good                              Cognition Arousal/Alertness: Awake/alert Behavior During Therapy: WFL for tasks assessed/performed Overall Cognitive Status: Within Functional Limits for tasks assessed                                        Exercises Total Joint Exercises Goniometric ROM: L knee AAROM: 0-89 degrees Other Exercises Other Exercises: Seated ther-ex performed on L LE including AP, quad sets, SLRs, hip abd/add, SAQ, and seated knee flexion. 15 reps. Written HEP given and reviewed Other Exercises: ambulated to bathroom to void. Able to perform hygiene with CGA. Safe technique    General Comments        Pertinent Vitals/Pain Pain Assessment: 0-10 Pain Score: 8  Pain Location: L quad Pain Descriptors / Indicators: Sore Pain Intervention(s): Limited activity within patient's tolerance;Ice applied;Premedicated before session    Home Living                      Prior Function            PT Goals (current goals can now be found in the care plan section) Acute Rehab PT Goals Patient Stated Goal: to go home PT Goal Formulation: With patient Time For Goal Achievement: 06/22/21 Potential to Achieve  Goals: Good Progress towards PT goals: Progressing toward goals    Frequency    BID      PT Plan Current plan remains appropriate    Co-evaluation              AM-PAC PT "6 Clicks" Mobility   Outcome Measure  Help needed turning from your back to your side while in a flat bed without using bedrails?: None Help needed moving from lying on your back to sitting on the side of a flat bed without using bedrails?: A Little Help needed moving to and from a bed to a chair (including a wheelchair)?: A Little Help needed standing up from a chair using your arms (e.g., wheelchair or bedside chair)?: A Little Help needed to walk in hospital room?: A Little Help  needed climbing 3-5 steps with a railing? : A Lot 6 Click Score: 18    End of Session Equipment Utilized During Treatment: Gait belt Activity Tolerance: Patient tolerated treatment well Patient left: in chair;with chair alarm set;with family/visitor present Nurse Communication: Mobility status PT Visit Diagnosis: Muscle weakness (generalized) (M62.81);Difficulty in walking, not elsewhere classified (R26.2);Pain Pain - Right/Left: Left Pain - part of body: Knee     Time: 1950-9326 PT Time Calculation (min) (ACUTE ONLY): 47 min  Charges:  $Gait Training: 8-22 mins $Therapeutic Exercise: 8-22 mins $Therapeutic Activity: 8-22 mins                     Zachary Martinez, PT, DPT [phone]    Zachary Martinez 06/10/2021, 12:33 PM

## 2021-06-10 NOTE — Progress Notes (Signed)
Physical Therapy Treatment Patient Details Name: Zachary Martinez MRN: [id number] DOB: 05-18-1941 Today's Date: 06/10/2021    History of Present Illness Pt admitted for L TKR. History includes CAD, DM, HTN, L tibial plateau fx in 6/22.    PT Comments    Pt is making good progress and is safe to dc home this date. Stair training performed with safe technique. Pt able to don clothes with assist. Left with RN in room for dc.   Follow Up Recommendations  Home health PT     Equipment Recommendations  None recommended by PT    Recommendations for Other Services       Precautions / Restrictions Precautions Precautions: Knee;Fall Precaution Booklet Issued: Yes (comment) Restrictions Weight Bearing Restrictions: Yes LLE Weight Bearing: Weight bearing as tolerated    Mobility  Bed Mobility Overal bed mobility: Needs Assistance Bed Mobility: Supine to Sit     Supine to sit: Min guard     General bed mobility comments: assist for sliding surgical leg off bed. Once seated, upright posture noted.    Transfers Overall transfer level: Needs assistance Equipment used: Rolling walker (2 wheeled) Transfers: Sit to/from Stand Sit to Stand: Supervision         General transfer comment: bed slightly elevated. Safe technique with RW  Ambulation/Gait Ambulation/Gait assistance: Min guard Gait Distance (Feet): 5 Feet Assistive device: Rolling walker (2 wheeled) Gait Pattern/deviations: Step-to pattern     General Gait Details: ambulated over to recliner with safe technique.   Stairs Stairs: Yes Stairs assistance: Min guard Stair Management: One rail Right;Step to pattern Number of Stairs: 4 General stair comments: up/down with safe technique. Step to gait pattern. Demonstrated prior to performance.   Wheelchair Mobility    Modified Rankin (Stroke Patients Only)       Balance Overall balance assessment: Needs assistance Sitting-balance support: Bilateral upper  extremity supported Sitting balance-Leahy Scale: Good     Standing balance support: Bilateral upper extremity supported Standing balance-Leahy Scale: Good                              Cognition Arousal/Alertness: Awake/alert Behavior During Therapy: WFL for tasks assessed/performed Overall Cognitive Status: Within Functional Limits for tasks assessed                                        Exercises Total Joint Exercises Goniometric ROM: L knee AAROM: 0-89 degrees Other Exercises Other Exercises: Seated ther-ex performed on L LE including AP, quad sets, SLRs, hip abd/add, SAQ, and seated knee flexion. 15 reps. Written HEP given and reviewed Other Exercises: Able to perform hygiene with CGA. Safe technique.    General Comments        Pertinent Vitals/Pain Pain Assessment: 0-10 Pain Score: 4  Pain Location: L quad Pain Descriptors / Indicators: Sore Pain Intervention(s): Limited activity within patient's tolerance;Premedicated before session;Ice applied    Home Living                      Prior Function            PT Goals (current goals can now be found in the care plan section) Acute Rehab PT Goals Patient Stated Goal: to go home PT Goal Formulation: With patient Time For Goal Achievement: 06/22/21 Potential to Achieve Goals: Good Progress towards PT  goals: Progressing toward goals    Frequency    BID      PT Plan Current plan remains appropriate    Co-evaluation              AM-PAC PT "6 Clicks" Mobility   Outcome Measure  Help needed turning from your back to your side while in a flat bed without using bedrails?: None Help needed moving from lying on your back to sitting on the side of a flat bed without using bedrails?: A Little Help needed moving to and from a bed to a chair (including a wheelchair)?: A Little Help needed standing up from a chair using your arms (e.g., wheelchair or bedside chair)?: A  Little Help needed to walk in hospital room?: A Little Help needed climbing 3-5 steps with a railing? : A Little 6 Click Score: 19    End of Session Equipment Utilized During Treatment: Gait belt Activity Tolerance: Patient tolerated treatment well Patient left: in chair;with nursing/sitter in room Nurse Communication: Mobility status PT Visit Diagnosis: Muscle weakness (generalized) (M62.81);Difficulty in walking, not elsewhere classified (R26.2);Pain Pain - Right/Left: Left Pain - part of body: Knee     Time: 7169-6789 PT Time Calculation (min) (ACUTE ONLY): 28 min  Charges:  $Gait Training: 23-37 mins $Therapeutic Exercise: 8-22 mins $Therapeutic Activity: 8-22 mins                     [name], PT, DPT [phone]    Zachary Martinez 06/10/2021, 2:43 PM

## 2021-06-10 NOTE — Progress Notes (Signed)
   Subjective: 2 Days Post-Op Procedure(s) (LRB): TOTAL KNEE ARTHROPLASTY (Left) Patient reports pain as 4 on 0-10 scale.   Patient is well, and has had no acute complaints or problems Denies any CP, SOB, ABD pain. We will continue therapy today.  Plan is to go Home after hospital stay.  Objective: Vital signs in last 24 hours: Temp:  [97.9 F (36.6 C)-98.6 F (37 C)] 97.9 F (36.6 C) (08/11 0750) Pulse Rate:  [54-62] 60 (08/11 0750) Resp:  [16-18] 18 (08/11 0750) BP: (137-158)/(55-73) 158/63 (08/11 0750) SpO2:  [92 %-98 %] 97 % (08/11 0750)  Intake/Output from previous day: 08/10 0701 - 08/11 0700 In: -  Out: 600 [Urine:600] Intake/Output this shift: No intake/output data recorded.  Recent Labs    06/08/21 1249 06/09/21 0439  HGB 12.8* 11.6*   Recent Labs    06/08/21 1249 06/09/21 0439  WBC 6.7 5.5  RBC 4.02* 3.75*  HCT 38.5* 35.3*  PLT 170 138*   Recent Labs    06/08/21 1249 06/09/21 0439  NA  --  136  K  --  3.7  CL  --  104  CO2  --  27  BUN  --  19  CREATININE 0.98 0.81  GLUCOSE  --  177*  CALCIUM  --  9.2   No results for input(s): LABPT, INR in the last 72 hours.  EXAM General - Patient is Alert, Appropriate, and Oriented Extremity - Neurovascular intact Sensation intact distally Intact pulses distally Incision: dressing C/D/I and no drainage No cellulitis present Compartment soft Dressing - dressing C/D/I, prevena intact with out drainage Motor Function - intact, moving foot and toes well on exam.   Past Medical History:  Diagnosis Date   Arthritis    knees    Bilateral carotid artery disease (HCC)    CAD (coronary artery disease) 02/13/2013   3v CAD; no PCI   Cataract    removed right eye. left eye forming    Diverticulosis    Hyperlipidemia    Hypertension    Hypothyroidism    LVH (left ventricular hypertrophy)    Neuropathy    OSA on CPAP    T2DM (type 2 diabetes mellitus) ([place])    Valvular insufficiency    a.) moderate  MR and TR   Venous insufficiency of both lower extremities     Assessment/Plan:   2 Days Post-Op Procedure(s) (LRB): TOTAL KNEE ARTHROPLASTY (Left) Active Problems:   S/P TKR (total knee replacement) using cement, left  Estimated body mass index is 35.44 kg/m as calculated from the following:   Height as of this encounter: 5\' 9"  (1.753 m).   Weight as of this encounter: 108.9 kg. Advance diet Up with therapy Work on BM Pain controlled VSS  CM to assist with discharge to home with HHPT today  DVT Prophylaxis - Lovenox, Foot Pumps, and TED hose Weight-Bearing as tolerated to left leg   T. Rachelle Hora, PA-C [place] 06/10/2021, 10:01 AM

## 2021-06-11 DIAGNOSIS — E1142 Type 2 diabetes mellitus with diabetic polyneuropathy: Secondary | ICD-10-CM | POA: Diagnosis not present

## 2021-06-11 DIAGNOSIS — E1151 Type 2 diabetes mellitus with diabetic peripheral angiopathy without gangrene: Secondary | ICD-10-CM | POA: Diagnosis not present

## 2021-06-11 DIAGNOSIS — I872 Venous insufficiency (chronic) (peripheral): Secondary | ICD-10-CM | POA: Diagnosis not present

## 2021-06-11 DIAGNOSIS — Z471 Aftercare following joint replacement surgery: Secondary | ICD-10-CM | POA: Diagnosis not present

## 2021-06-11 DIAGNOSIS — E785 Hyperlipidemia, unspecified: Secondary | ICD-10-CM | POA: Diagnosis not present

## 2021-06-11 DIAGNOSIS — G4733 Obstructive sleep apnea (adult) (pediatric): Secondary | ICD-10-CM | POA: Diagnosis not present

## 2021-06-11 DIAGNOSIS — I252 Old myocardial infarction: Secondary | ICD-10-CM | POA: Diagnosis not present

## 2021-06-11 DIAGNOSIS — Z7901 Long term (current) use of anticoagulants: Secondary | ICD-10-CM | POA: Diagnosis not present

## 2021-06-11 DIAGNOSIS — K5900 Constipation, unspecified: Secondary | ICD-10-CM | POA: Diagnosis not present

## 2021-06-11 DIAGNOSIS — I119 Hypertensive heart disease without heart failure: Secondary | ICD-10-CM | POA: Diagnosis not present

## 2021-06-11 DIAGNOSIS — M1711 Unilateral primary osteoarthritis, right knee: Secondary | ICD-10-CM | POA: Diagnosis not present

## 2021-06-11 DIAGNOSIS — Z96652 Presence of left artificial knee joint: Secondary | ICD-10-CM | POA: Diagnosis not present

## 2021-06-11 DIAGNOSIS — I6523 Occlusion and stenosis of bilateral carotid arteries: Secondary | ICD-10-CM | POA: Diagnosis not present

## 2021-06-11 DIAGNOSIS — I2510 Atherosclerotic heart disease of native coronary artery without angina pectoris: Secondary | ICD-10-CM | POA: Diagnosis not present

## 2021-06-11 DIAGNOSIS — I081 Rheumatic disorders of both mitral and tricuspid valves: Secondary | ICD-10-CM | POA: Diagnosis not present

## 2021-06-11 DIAGNOSIS — D291 Benign neoplasm of prostate: Secondary | ICD-10-CM | POA: Diagnosis not present

## 2021-06-11 DIAGNOSIS — E039 Hypothyroidism, unspecified: Secondary | ICD-10-CM | POA: Diagnosis not present

## 2021-06-11 DIAGNOSIS — K5730 Diverticulosis of large intestine without perforation or abscess without bleeding: Secondary | ICD-10-CM | POA: Diagnosis not present

## 2021-06-14 DIAGNOSIS — Z471 Aftercare following joint replacement surgery: Secondary | ICD-10-CM | POA: Diagnosis not present

## 2021-06-14 DIAGNOSIS — M1711 Unilateral primary osteoarthritis, right knee: Secondary | ICD-10-CM | POA: Diagnosis not present

## 2021-06-14 DIAGNOSIS — I872 Venous insufficiency (chronic) (peripheral): Secondary | ICD-10-CM | POA: Diagnosis not present

## 2021-06-14 DIAGNOSIS — E1151 Type 2 diabetes mellitus with diabetic peripheral angiopathy without gangrene: Secondary | ICD-10-CM | POA: Diagnosis not present

## 2021-06-14 DIAGNOSIS — I2510 Atherosclerotic heart disease of native coronary artery without angina pectoris: Secondary | ICD-10-CM | POA: Diagnosis not present

## 2021-06-14 DIAGNOSIS — I119 Hypertensive heart disease without heart failure: Secondary | ICD-10-CM | POA: Diagnosis not present

## 2021-06-16 DIAGNOSIS — I119 Hypertensive heart disease without heart failure: Secondary | ICD-10-CM | POA: Diagnosis not present

## 2021-06-16 DIAGNOSIS — I872 Venous insufficiency (chronic) (peripheral): Secondary | ICD-10-CM | POA: Diagnosis not present

## 2021-06-16 DIAGNOSIS — E1151 Type 2 diabetes mellitus with diabetic peripheral angiopathy without gangrene: Secondary | ICD-10-CM | POA: Diagnosis not present

## 2021-06-16 DIAGNOSIS — Z471 Aftercare following joint replacement surgery: Secondary | ICD-10-CM | POA: Diagnosis not present

## 2021-06-16 DIAGNOSIS — M1711 Unilateral primary osteoarthritis, right knee: Secondary | ICD-10-CM | POA: Diagnosis not present

## 2021-06-16 DIAGNOSIS — I2510 Atherosclerotic heart disease of native coronary artery without angina pectoris: Secondary | ICD-10-CM | POA: Diagnosis not present

## 2021-06-17 DIAGNOSIS — M1711 Unilateral primary osteoarthritis, right knee: Secondary | ICD-10-CM | POA: Diagnosis not present

## 2021-06-17 DIAGNOSIS — I119 Hypertensive heart disease without heart failure: Secondary | ICD-10-CM | POA: Diagnosis not present

## 2021-06-17 DIAGNOSIS — E1151 Type 2 diabetes mellitus with diabetic peripheral angiopathy without gangrene: Secondary | ICD-10-CM | POA: Diagnosis not present

## 2021-06-17 DIAGNOSIS — Z471 Aftercare following joint replacement surgery: Secondary | ICD-10-CM | POA: Diagnosis not present

## 2021-06-17 DIAGNOSIS — I2510 Atherosclerotic heart disease of native coronary artery without angina pectoris: Secondary | ICD-10-CM | POA: Diagnosis not present

## 2021-06-17 DIAGNOSIS — I872 Venous insufficiency (chronic) (peripheral): Secondary | ICD-10-CM | POA: Diagnosis not present

## 2021-06-19 DIAGNOSIS — I119 Hypertensive heart disease without heart failure: Secondary | ICD-10-CM | POA: Diagnosis not present

## 2021-06-19 DIAGNOSIS — I2510 Atherosclerotic heart disease of native coronary artery without angina pectoris: Secondary | ICD-10-CM | POA: Diagnosis not present

## 2021-06-19 DIAGNOSIS — M1711 Unilateral primary osteoarthritis, right knee: Secondary | ICD-10-CM | POA: Diagnosis not present

## 2021-06-19 DIAGNOSIS — Z471 Aftercare following joint replacement surgery: Secondary | ICD-10-CM | POA: Diagnosis not present

## 2021-06-19 DIAGNOSIS — I872 Venous insufficiency (chronic) (peripheral): Secondary | ICD-10-CM | POA: Diagnosis not present

## 2021-06-19 DIAGNOSIS — E1151 Type 2 diabetes mellitus with diabetic peripheral angiopathy without gangrene: Secondary | ICD-10-CM | POA: Diagnosis not present

## 2021-06-22 DIAGNOSIS — M1711 Unilateral primary osteoarthritis, right knee: Secondary | ICD-10-CM | POA: Diagnosis not present

## 2021-06-22 DIAGNOSIS — E1151 Type 2 diabetes mellitus with diabetic peripheral angiopathy without gangrene: Secondary | ICD-10-CM | POA: Diagnosis not present

## 2021-06-22 DIAGNOSIS — Z471 Aftercare following joint replacement surgery: Secondary | ICD-10-CM | POA: Diagnosis not present

## 2021-06-22 DIAGNOSIS — I2510 Atherosclerotic heart disease of native coronary artery without angina pectoris: Secondary | ICD-10-CM | POA: Diagnosis not present

## 2021-06-22 DIAGNOSIS — I872 Venous insufficiency (chronic) (peripheral): Secondary | ICD-10-CM | POA: Diagnosis not present

## 2021-06-22 DIAGNOSIS — I119 Hypertensive heart disease without heart failure: Secondary | ICD-10-CM | POA: Diagnosis not present

## 2021-06-23 DIAGNOSIS — M6281 Muscle weakness (generalized): Secondary | ICD-10-CM | POA: Diagnosis not present

## 2021-06-23 DIAGNOSIS — M25662 Stiffness of left knee, not elsewhere classified: Secondary | ICD-10-CM | POA: Diagnosis not present

## 2021-06-23 DIAGNOSIS — Z96652 Presence of left artificial knee joint: Secondary | ICD-10-CM | POA: Diagnosis not present

## 2021-06-23 DIAGNOSIS — M25562 Pain in left knee: Secondary | ICD-10-CM | POA: Diagnosis not present

## 2021-06-24 ENCOUNTER — Ambulatory Visit (INDEPENDENT_AMBULATORY_CARE_PROVIDER_SITE_OTHER): Payer: Medicare Other | Admitting: Family Medicine

## 2021-06-24 ENCOUNTER — Encounter: Payer: Self-pay | Admitting: Family Medicine

## 2021-06-24 ENCOUNTER — Other Ambulatory Visit: Payer: Self-pay

## 2021-06-24 VITALS — BP 117/57 | HR 54 | Resp 20 | Ht 69.0 in | Wt 235.0 lb

## 2021-06-24 DIAGNOSIS — I872 Venous insufficiency (chronic) (peripheral): Secondary | ICD-10-CM

## 2021-06-24 DIAGNOSIS — E1140 Type 2 diabetes mellitus with diabetic neuropathy, unspecified: Secondary | ICD-10-CM | POA: Diagnosis not present

## 2021-06-24 DIAGNOSIS — Z96652 Presence of left artificial knee joint: Secondary | ICD-10-CM

## 2021-06-24 DIAGNOSIS — E7849 Other hyperlipidemia: Secondary | ICD-10-CM

## 2021-06-24 DIAGNOSIS — Z6835 Body mass index (BMI) 35.0-35.9, adult: Secondary | ICD-10-CM | POA: Diagnosis not present

## 2021-06-24 DIAGNOSIS — I2510 Atherosclerotic heart disease of native coronary artery without angina pectoris: Secondary | ICD-10-CM | POA: Diagnosis not present

## 2021-06-24 DIAGNOSIS — I10 Essential (primary) hypertension: Secondary | ICD-10-CM | POA: Diagnosis not present

## 2021-06-24 DIAGNOSIS — G4733 Obstructive sleep apnea (adult) (pediatric): Secondary | ICD-10-CM | POA: Diagnosis not present

## 2021-06-24 LAB — POCT GLYCOSYLATED HEMOGLOBIN (HGB A1C): Hemoglobin A1C: 6.2 % — AB (ref 4.0–5.6)

## 2021-06-24 NOTE — Progress Notes (Signed)
Established patient visit   Patient: Zachary Martinez   DOB: 1941/10/22   80 y.o. Male  MRN: [id number] Visit Date: 06/24/2021  Today's healthcare provider: [name], MD   Chief Complaint  Patient presents with   Diabetes   Hypertension   Hyperlipidemia   Subjective  -------------------------------------------------------------------------------------------------------------------- HPI  Patient is recovering well from left total knee replacement he had August 9th. Diabetes Mellitus Type II, follow-up  Lab Results  Component Value Date   HGBA1C 6.2 (A) 06/24/2021   HGBA1C 6.9 (A) 01/05/2021   HGBA1C 6.9 (A) 10/07/2020   Last seen for diabetes 5 months ago.  Management since then includes continuing the same treatment. He reports good compliance with treatment. He is not having side effects.   Home blood sugar records: not being checked  Episodes of hypoglycemia? No    Current insulin regiment: none Most Recent Eye Exam: up to date  Hypertension, follow-up  BP Readings from Last 3 Encounters:  06/24/21 (!) 117/57  06/10/21 (!) 153/66  05/28/21 (!) 152/62   Wt Readings from Last 3 Encounters:  06/24/21 235 lb (106.6 kg)  06/08/21 240 lb (108.9 kg)  05/28/21 243 lb 6.2 oz (110.4 kg)     He was last seen for hypertension 5 months ago.  Management since that visit includes no changes. He reports good compliance with treatment. He is not having side effects.  He is not exercising. He is adherent to low salt diet.   Outside blood pressures are not being checked.  He does not smoke.  Use of agents associated with hypertension: none.   Lipid/Cholesterol, follow-up  Last Lipid Panel: Lab Results  Component Value Date   CHOL 119 05/21/2020   LDLCALC 52 05/21/2020   HDL 29 (L) 05/21/2020   TRIG 234 (H) 05/21/2020    He was last seen for this 1  year  ago.  Management since that visit includes no changes.  He reports good compliance with  treatment. He is not having side effects.      Medications: Outpatient Medications Prior to Visit  Medication Sig   amLODipine (NORVASC) 10 MG tablet TAKE 1 TABLET EVERY DAY (Patient taking differently: Take 10 mg by mouth every morning.)   atorvastatin (LIPITOR) 40 MG tablet TAKE 1 TABLET EVERY DAY (Patient taking differently: Take 40 mg by mouth at bedtime.)   Canagliflozin-metFORMIN HCl (INVOKAMET) [phone] MG TABS Take 1 tablet by mouth 2 (two) times daily.   carvedilol (COREG) 6.25 MG tablet TAKE 1 TABLET TWICE DAILY (Patient taking differently: Take 6.25 mg by mouth 2 (two) times daily with a meal.)   Cholecalciferol (VITAMIN D3) 50 MCG (2000 UT) capsule Take 2,000 Units by mouth daily.   docusate sodium (COLACE) 100 MG capsule Take 1 capsule (100 mg total) by mouth 2 (two) times daily.   EPINEPHrine 0.3 mg/0.3 mL IJ SOAJ injection Inject 0.3 mg into the muscle as needed for anaphylaxis.   hydrALAZINE (APRESOLINE) 100 MG tablet TAKE 1 TABLET TWICE DAILY (Patient taking differently: Take 100 mg by mouth 2 (two) times daily.)   HYDROcodone-acetaminophen (NORCO/VICODIN) 5-325 MG tablet Take 1-2 tablets by mouth every 6 (six) hours as needed for moderate pain (pain score 4-6).   levothyroxine (SYNTHROID) 88 MCG tablet TAKE 1 TABLET EVERY DAY (Patient taking differently: Take 88 mcg by mouth daily before breakfast.)   methocarbamol (ROBAXIN) 500 MG tablet Take 1 tablet (500 mg total) by mouth every 6 (six) hours as needed for  muscle spasms.   Omega-3 Fatty Acids (FISH OIL) 1000 MG CAPS Take 1,000 mg by mouth daily.   pioglitazone (ACTOS) 30 MG tablet TAKE 1 TABLET EVERY DAY (Patient taking differently: Take 30 mg by mouth every morning.)   traMADol (ULTRAM) 50 MG tablet Take 1 tablet (50 mg total) by mouth every 6 (six) hours as needed.   enoxaparin (LOVENOX) 40 MG/0.4ML injection Inject 0.4 mLs (40 mg total) into the skin daily for 14 days. (Patient not taking: Reported on 06/24/2021)   No  facility-administered medications prior to visit.    Review of Systems  Constitutional:  Positive for activity change and fatigue.  Endocrine: Negative for cold intolerance, heat intolerance, polydipsia, polyphagia and polyuria.  Musculoskeletal:  Positive for arthralgias, joint swelling and myalgias.  Neurological:  Negative for dizziness, light-headedness and headaches.      Objective  -------------------------------------------------------------------------------------------------------------------- BP (!) 117/57   Pulse (!) 54   Resp 20   Ht $R'5\' 9"'TG$  (1.753 m)   Wt 235 lb (106.6 kg)   BMI 34.70 kg/m     Physical Exam Vitals reviewed.  Constitutional:      Appearance: He is well-developed. He is obese.  HENT:     Head: Normocephalic and atraumatic.     Right Ear: External ear normal.     Left Ear: External ear normal.     Nose: Nose normal.  Eyes:     General: No scleral icterus.    Conjunctiva/sclera: Conjunctivae normal.  Neck:     Thyroid: No thyromegaly.  Cardiovascular:     Rate and Rhythm: Normal rate and regular rhythm.     Heart sounds: Normal heart sounds.  Pulmonary:     Effort: Pulmonary effort is normal.     Breath sounds: Normal breath sounds.  Abdominal:     Palpations: Abdomen is soft.  Musculoskeletal:     Right lower leg: Edema present.     Left lower leg: Edema present.     Comments: 1+ lower extremity edema  Skin:    General: Skin is warm and dry.  Neurological:     General: No focal deficit present.     Mental Status: He is alert and oriented to person, place, and time.  Psychiatric:        Mood and Affect: Mood normal.        Behavior: Behavior normal.        Thought Content: Thought content normal.        Judgment: Judgment normal.      Results for orders placed or performed in visit on 06/24/21  POCT glycosylated hemoglobin (Hb A1C)  Result Value Ref Range   Hemoglobin A1C 6.2 (A) 4.0 - 5.6 %   HbA1c POC (<> result, manual entry)      HbA1c, POC (prediabetic range)     HbA1c, POC (controlled diabetic range)      Assessment & Plan  ---------------------------------------------------------------------------------------------------------------------- 1. Primary hypertension Good control on amlodipine and Coreg and hydralazine  2. Type 2 diabetes mellitus with diabetic neuropathy, without long-term current use of insulin (HCC) On pioglitazone and invoke a met - POCT glycosylated hemoglobin (Hb A1C)  3. Other hyperlipidemia On atorvastatin 40  4. Venous insufficiency of both lower extremities Patient wearing support hose  5. CAD in native artery All risk factors treated  6. Obstructive sleep apnea syndrome   7. S/P TKR (total knee replacement) using cement, left Recovering nicely from surgery  8. Class 2 severe obesity due to excess calories  with serious comorbidity and body mass index (BMI) of 35.0 to 35.9 in adult [facility]) Diet and exercise stressed as it will help all the above   No follow-ups on file.      I, [name], MD, have reviewed all documentation for this visit. The documentation on 07/02/21 for the exam, diagnosis, procedures, and orders are all accurate and complete.    Carston Riedl Cranford Mon, MD  [facility] [phone] (phone) [phone] (fax)  [place]

## 2021-06-25 DIAGNOSIS — M25562 Pain in left knee: Secondary | ICD-10-CM | POA: Diagnosis not present

## 2021-06-25 DIAGNOSIS — Z96652 Presence of left artificial knee joint: Secondary | ICD-10-CM | POA: Diagnosis not present

## 2021-06-29 DIAGNOSIS — M25562 Pain in left knee: Secondary | ICD-10-CM | POA: Diagnosis not present

## 2021-06-29 DIAGNOSIS — Z96652 Presence of left artificial knee joint: Secondary | ICD-10-CM | POA: Diagnosis not present

## 2021-07-01 ENCOUNTER — Other Ambulatory Visit: Payer: Self-pay | Admitting: Family Medicine

## 2021-07-01 DIAGNOSIS — I10 Essential (primary) hypertension: Secondary | ICD-10-CM

## 2021-07-01 DIAGNOSIS — M25562 Pain in left knee: Secondary | ICD-10-CM | POA: Diagnosis not present

## 2021-07-01 DIAGNOSIS — Z96652 Presence of left artificial knee joint: Secondary | ICD-10-CM | POA: Diagnosis not present

## 2021-07-01 DIAGNOSIS — E039 Hypothyroidism, unspecified: Secondary | ICD-10-CM

## 2021-07-01 NOTE — Telephone Encounter (Signed)
Requested medications are due for refill today yes  Requested medications are on the active medication list yes  Last refill 12/23/20  Last visit Was seen in Mar 2022 but labs are  from 04/2020  Future visit scheduled 10/2021  Notes to clinic Failed protocol due to no valid Lipid labs within 360 days, next appt scheduled for Jan 2023.

## 2021-07-06 DIAGNOSIS — M25562 Pain in left knee: Secondary | ICD-10-CM | POA: Diagnosis not present

## 2021-07-06 DIAGNOSIS — Z96652 Presence of left artificial knee joint: Secondary | ICD-10-CM | POA: Diagnosis not present

## 2021-07-09 DIAGNOSIS — M25562 Pain in left knee: Secondary | ICD-10-CM | POA: Diagnosis not present

## 2021-07-09 DIAGNOSIS — Z96652 Presence of left artificial knee joint: Secondary | ICD-10-CM | POA: Diagnosis not present

## 2021-07-13 DIAGNOSIS — M25562 Pain in left knee: Secondary | ICD-10-CM | POA: Diagnosis not present

## 2021-07-13 DIAGNOSIS — Z96652 Presence of left artificial knee joint: Secondary | ICD-10-CM | POA: Diagnosis not present

## 2021-07-15 DIAGNOSIS — Z96652 Presence of left artificial knee joint: Secondary | ICD-10-CM | POA: Diagnosis not present

## 2021-07-15 DIAGNOSIS — M25562 Pain in left knee: Secondary | ICD-10-CM | POA: Diagnosis not present

## 2021-07-19 DIAGNOSIS — M25562 Pain in left knee: Secondary | ICD-10-CM | POA: Diagnosis not present

## 2021-07-19 DIAGNOSIS — Z96652 Presence of left artificial knee joint: Secondary | ICD-10-CM | POA: Diagnosis not present

## 2021-07-21 DIAGNOSIS — M1712 Unilateral primary osteoarthritis, left knee: Secondary | ICD-10-CM | POA: Diagnosis not present

## 2021-07-21 DIAGNOSIS — Z96652 Presence of left artificial knee joint: Secondary | ICD-10-CM | POA: Diagnosis not present

## 2021-07-21 DIAGNOSIS — M25562 Pain in left knee: Secondary | ICD-10-CM | POA: Diagnosis not present

## 2021-07-23 DIAGNOSIS — Z96652 Presence of left artificial knee joint: Secondary | ICD-10-CM | POA: Diagnosis not present

## 2021-07-23 DIAGNOSIS — M25562 Pain in left knee: Secondary | ICD-10-CM | POA: Diagnosis not present

## 2021-07-28 DIAGNOSIS — E782 Mixed hyperlipidemia: Secondary | ICD-10-CM | POA: Diagnosis not present

## 2021-07-28 DIAGNOSIS — I872 Venous insufficiency (chronic) (peripheral): Secondary | ICD-10-CM | POA: Diagnosis not present

## 2021-07-28 DIAGNOSIS — I119 Hypertensive heart disease without heart failure: Secondary | ICD-10-CM | POA: Diagnosis not present

## 2021-07-28 DIAGNOSIS — R001 Bradycardia, unspecified: Secondary | ICD-10-CM | POA: Diagnosis not present

## 2021-07-28 DIAGNOSIS — I10 Essential (primary) hypertension: Secondary | ICD-10-CM | POA: Diagnosis not present

## 2021-07-28 DIAGNOSIS — G4733 Obstructive sleep apnea (adult) (pediatric): Secondary | ICD-10-CM | POA: Diagnosis not present

## 2021-07-28 DIAGNOSIS — I340 Nonrheumatic mitral (valve) insufficiency: Secondary | ICD-10-CM | POA: Diagnosis not present

## 2021-07-28 DIAGNOSIS — E119 Type 2 diabetes mellitus without complications: Secondary | ICD-10-CM | POA: Diagnosis not present

## 2021-07-28 DIAGNOSIS — I2510 Atherosclerotic heart disease of native coronary artery without angina pectoris: Secondary | ICD-10-CM | POA: Diagnosis not present

## 2021-08-17 ENCOUNTER — Other Ambulatory Visit: Payer: Self-pay | Admitting: Family Medicine

## 2021-08-17 DIAGNOSIS — E1165 Type 2 diabetes mellitus with hyperglycemia: Secondary | ICD-10-CM

## 2021-08-17 DIAGNOSIS — E7849 Other hyperlipidemia: Secondary | ICD-10-CM

## 2021-08-17 DIAGNOSIS — I10 Essential (primary) hypertension: Secondary | ICD-10-CM

## 2021-08-17 DIAGNOSIS — E1142 Type 2 diabetes mellitus with diabetic polyneuropathy: Secondary | ICD-10-CM

## 2021-08-18 DIAGNOSIS — D2261 Melanocytic nevi of right upper limb, including shoulder: Secondary | ICD-10-CM | POA: Diagnosis not present

## 2021-08-18 DIAGNOSIS — X32XXXA Exposure to sunlight, initial encounter: Secondary | ICD-10-CM | POA: Diagnosis not present

## 2021-08-18 DIAGNOSIS — D485 Neoplasm of uncertain behavior of skin: Secondary | ICD-10-CM | POA: Diagnosis not present

## 2021-08-18 DIAGNOSIS — L570 Actinic keratosis: Secondary | ICD-10-CM | POA: Diagnosis not present

## 2021-08-18 DIAGNOSIS — D2262 Melanocytic nevi of left upper limb, including shoulder: Secondary | ICD-10-CM | POA: Diagnosis not present

## 2021-08-18 DIAGNOSIS — Z85828 Personal history of other malignant neoplasm of skin: Secondary | ICD-10-CM | POA: Diagnosis not present

## 2021-08-18 DIAGNOSIS — D0439 Carcinoma in situ of skin of other parts of face: Secondary | ICD-10-CM | POA: Diagnosis not present

## 2021-08-18 DIAGNOSIS — L821 Other seborrheic keratosis: Secondary | ICD-10-CM | POA: Diagnosis not present

## 2021-08-19 ENCOUNTER — Other Ambulatory Visit: Payer: Self-pay

## 2021-08-19 ENCOUNTER — Ambulatory Visit (INDEPENDENT_AMBULATORY_CARE_PROVIDER_SITE_OTHER): Payer: Medicare Other | Admitting: Family Medicine

## 2021-08-19 DIAGNOSIS — Z23 Encounter for immunization: Secondary | ICD-10-CM

## 2021-10-08 DIAGNOSIS — M1711 Unilateral primary osteoarthritis, right knee: Secondary | ICD-10-CM | POA: Diagnosis not present

## 2021-10-23 DIAGNOSIS — Z23 Encounter for immunization: Secondary | ICD-10-CM | POA: Diagnosis not present

## 2021-11-07 DIAGNOSIS — Z20822 Contact with and (suspected) exposure to covid-19: Secondary | ICD-10-CM | POA: Diagnosis not present

## 2021-11-22 ENCOUNTER — Ambulatory Visit: Payer: Medicare Other | Admitting: Family Medicine

## 2021-11-26 DIAGNOSIS — S0081XA Abrasion of other part of head, initial encounter: Secondary | ICD-10-CM | POA: Diagnosis not present

## 2021-11-26 DIAGNOSIS — W19XXXA Unspecified fall, initial encounter: Secondary | ICD-10-CM | POA: Diagnosis not present

## 2021-11-30 DIAGNOSIS — E113293 Type 2 diabetes mellitus with mild nonproliferative diabetic retinopathy without macular edema, bilateral: Secondary | ICD-10-CM | POA: Diagnosis not present

## 2021-11-30 LAB — HM DIABETES EYE EXAM

## 2022-01-05 ENCOUNTER — Ambulatory Visit (INDEPENDENT_AMBULATORY_CARE_PROVIDER_SITE_OTHER): Payer: Medicare Other

## 2022-01-05 ENCOUNTER — Other Ambulatory Visit: Payer: Self-pay

## 2022-01-05 VITALS — BP 110/60 | HR 49 | Temp 98.7°F | Ht 69.0 in | Wt 249.9 lb

## 2022-01-05 DIAGNOSIS — Z0000 Encounter for general adult medical examination without abnormal findings: Secondary | ICD-10-CM | POA: Diagnosis not present

## 2022-01-05 NOTE — Patient Instructions (Signed)
Zachary Martinez , Thank you for taking time to come for your Medicare Wellness Visit. I appreciate your ongoing commitment to your health goals. Please review the following plan we discussed and let me know if I can assist you in the future.   Screening recommendations/referrals: Colonoscopy: aged out Recommended yearly ophthalmology/optometry visit for glaucoma screening and checkup Recommended yearly dental visit for hygiene and checkup  Vaccinations: Influenza vaccine: 08/19/21 Pneumococcal vaccine: 06/16/14 Tdap vaccine: 01/24/15 Shingles vaccine: Shingrix 11/15/21, due for 2nd shot   Covid-19: 11/07/19, 11/28/19, 08/07/20  Advanced directives: yes  Conditions/risks identified: none  Next appointment: Follow up in one year for your annual wellness visit. 01/10/23 @ 8:20am in person  Preventive Care 65 Years and Older, Male Preventive care refers to lifestyle choices and visits with your health care provider that can promote health and wellness. What does preventive care include? A yearly physical exam. This is also called an annual well check. Dental exams once or twice a year. Routine eye exams. Ask your health care provider how often you should have your eyes checked. Personal lifestyle choices, including: Daily care of your teeth and gums. Regular physical activity. Eating a healthy diet. Avoiding tobacco and drug use. Limiting alcohol use. Practicing safe sex. Taking low doses of aspirin every day. Taking vitamin and mineral supplements as recommended by your health care provider. What happens during an annual well check? The services and screenings done by your health care provider during your annual well check will depend on your age, overall health, lifestyle risk factors, and family history of disease. Counseling  Your health care provider may ask you questions about your: Alcohol use. Tobacco use. Drug use. Emotional well-being. Home and relationship well-being. Sexual  activity. Eating habits. History of falls. Memory and ability to understand (cognition). Work and work Statistician. Screening  You may have the following tests or measurements: Height, weight, and BMI. Blood pressure. Lipid and cholesterol levels. These may be checked every 5 years, or more frequently if you are over 80 years old. Skin check. Lung cancer screening. You may have this screening every year starting at age 80 if you have a 30-pack-year history of smoking and currently smoke or have quit within the past 15 years. Fecal occult blood test (FOBT) of the stool. You may have this test every year starting at age 80. Flexible sigmoidoscopy or colonoscopy. You may have a sigmoidoscopy every 5 years or a colonoscopy every 10 years starting at age 80. Prostate cancer screening. Recommendations will vary depending on your family history and other risks. Hepatitis C blood test. Hepatitis B blood test. Sexually transmitted disease (STD) testing. Diabetes screening. This is done by checking your blood sugar (glucose) after you have not eaten for a while (fasting). You may have this done every 1-3 years. Abdominal aortic aneurysm (AAA) screening. You may need this if you are a current or former smoker. Osteoporosis. You may be screened starting at age 80 if you are at high risk. Talk with your health care provider about your test results, treatment options, and if necessary, the need for more tests. Vaccines  Your health care provider may recommend certain vaccines, such as: Influenza vaccine. This is recommended every year. Tetanus, diphtheria, and acellular pertussis (Tdap, Td) vaccine. You may need a Td booster every 10 years. Zoster vaccine. You may need this after age 80. Pneumococcal 13-valent conjugate (PCV13) vaccine. One dose is recommended after age 80. Pneumococcal polysaccharide (PPSV23) vaccine. One dose is recommended after age 80.  Talk to your health care provider about which  screenings and vaccines you need and how often you need them. This information is not intended to replace advice given to you by your health care provider. Make sure you discuss any questions you have with your health care provider. Document Released: 11/13/2015 Document Revised: 07/06/2016 Document Reviewed: 08/18/2015 Elsevier Interactive Patient Education  2017 [employer] Prevention in the Home Falls can cause injuries. They can happen to people of all ages. There are many things you can do to make your home safe and to help prevent falls. What can I do on the outside of my home? Regularly fix the edges of walkways and driveways and fix any cracks. Remove anything that might make you trip as you walk through a door, such as a raised step or threshold. Trim any bushes or trees on the path to your home. Use bright outdoor lighting. Clear any walking paths of anything that might make someone trip, such as rocks or tools. Regularly check to see if handrails are loose or broken. Make sure that both sides of any steps have handrails. Any raised decks and porches should have guardrails on the edges. Have any leaves, snow, or ice cleared regularly. Use sand or salt on walking paths during winter. Clean up any spills in your garage right away. This includes oil or grease spills. What can I do in the bathroom? Use night lights. Install grab bars by the toilet and in the tub and shower. Do not use towel bars as grab bars. Use non-skid mats or decals in the tub or shower. If you need to sit down in the shower, use a plastic, non-slip stool. Keep the floor dry. Clean up any water that spills on the floor as soon as it happens. Remove soap buildup in the tub or shower regularly. Attach bath mats securely with double-sided non-slip rug tape. Do not have throw rugs and other things on the floor that can make you trip. What can I do in the bedroom? Use night lights. Make sure that you have a  light by your bed that is easy to reach. Do not use any sheets or blankets that are too big for your bed. They should not hang down onto the floor. Have a firm chair that has side arms. You can use this for support while you get dressed. Do not have throw rugs and other things on the floor that can make you trip. What can I do in the kitchen? Clean up any spills right away. Avoid walking on wet floors. Keep items that you use a lot in easy-to-reach places. If you need to reach something above you, use a strong step stool that has a grab bar. Keep electrical cords out of the way. Do not use floor polish or wax that makes floors slippery. If you must use wax, use non-skid floor wax. Do not have throw rugs and other things on the floor that can make you trip. What can I do with my stairs? Do not leave any items on the stairs. Make sure that there are handrails on both sides of the stairs and use them. Fix handrails that are broken or loose. Make sure that handrails are as long as the stairways. Check any carpeting to make sure that it is firmly attached to the stairs. Fix any carpet that is loose or worn. Avoid having throw rugs at the top or bottom of the stairs. If you do have throw  rugs, attach them to the floor with carpet tape. Make sure that you have a light switch at the top of the stairs and the bottom of the stairs. If you do not have them, ask someone to add them for you. What else can I do to help prevent falls? Wear shoes that: Do not have high heels. Have rubber bottoms. Are comfortable and fit you well. Are closed at the toe. Do not wear sandals. If you use a stepladder: Make sure that it is fully opened. Do not climb a closed stepladder. Make sure that both sides of the stepladder are locked into place. Ask someone to hold it for you, if possible. Clearly mark and make sure that you can see: Any grab bars or handrails. First and last steps. Where the edge of each step  is. Use tools that help you move around (mobility aids) if they are needed. These include: Canes. Walkers. Scooters. Crutches. Turn on the lights when you go into a dark area. Replace any light bulbs as soon as they burn out. Set up your furniture so you have a clear path. Avoid moving your furniture around. If any of your floors are uneven, fix them. If there are any pets around you, be aware of where they are. Review your medicines with your doctor. Some medicines can make you feel dizzy. This can increase your chance of falling. Ask your doctor what other things that you can do to help prevent falls. This information is not intended to replace advice given to you by your health care provider. Make sure you discuss any questions you have with your health care provider. Document Released: 08/13/2009 Document Revised: 03/24/2016 Document Reviewed: 11/21/2014 Elsevier Interactive Patient Education  2017 [employer].

## 2022-01-05 NOTE — Progress Notes (Signed)
Subjective:   Zachary Martinez is a 81 y.o. male who presents for Medicare Annual/Subsequent preventive examination.  Review of Systems           Objective:    Today's Vitals   01/05/22 0812  BP: 110/60  Pulse: (!) 49  Temp: 98.7 F (37.1 C)  TempSrc: Oral  SpO2: 97%  Weight: 249 lb 14.4 oz (113.4 kg)  Height: 5\' 9"  (1.753 m)   Body mass index is 36.9 kg/m.  Advanced Directives 06/08/2021 05/28/2021 04/01/2021 03/24/2021 11/09/2020 11/05/2019 10/22/2018  Does Patient Have a Medical Advance Directive? Yes Yes Yes Yes Yes Yes Yes  Type of Paramedic of [place];Living will - Living will;Healthcare Power of [place];Living will [place];Living will [place];Living will [place];Living will  Does patient want to make changes to medical advance directive? No - Patient declined - No - Patient declined No - Patient declined - - -  Copy of [place] in Chart? No - copy requested - No - copy requested No - copy requested Yes - validated most recent copy scanned in chart (See row information) Yes - validated most recent copy scanned in chart (See row information) Yes - validated most recent copy scanned in chart (See row information)  Would patient like information on creating a medical advance directive? - - - - - - -    Current Medications (verified) Outpatient Encounter Medications as of 01/05/2022  Medication Sig   cephALEXin (KEFLEX) 500 MG capsule Take 4 pills one hour prior to dental appointment.   molnupiravir EUA (LAGEVRIO) 200 MG CAPS capsule Take by mouth.   amLODipine (NORVASC) 10 MG tablet TAKE 1 TABLET EVERY DAY   atorvastatin (LIPITOR) 40 MG tablet TAKE 1 TABLET EVERY DAY   carvedilol (COREG) 6.25 MG tablet Take 1 tablet (6.25 mg total) by mouth 2 (two) times daily with a meal.   Cholecalciferol (VITAMIN D3) 50 MCG (2000 UT) capsule Take 2,000 Units by  mouth daily.   docusate sodium (COLACE) 100 MG capsule Take 1 capsule (100 mg total) by mouth 2 (two) times daily.   enoxaparin (LOVENOX) 40 MG/0.4ML injection Inject 0.4 mLs (40 mg total) into the skin daily for 14 days. (Patient not taking: Reported on 06/24/2021)   EPINEPHrine 0.3 mg/0.3 mL IJ SOAJ injection Inject 0.3 mg into the muscle as needed for anaphylaxis.   hydrALAZINE (APRESOLINE) 100 MG tablet TAKE 1 TABLET TWICE DAILY   HYDROcodone-acetaminophen (NORCO/VICODIN) 5-325 MG tablet Take 1-2 tablets by mouth every 6 (six) hours as needed for moderate pain (pain score 4-6).   INVOKAMET [phone] MG TABS TAKE 1 TABLET TWICE DAILY   levothyroxine (SYNTHROID) 88 MCG tablet Take 1 tablet (88 mcg total) by mouth daily before breakfast.   methocarbamol (ROBAXIN) 500 MG tablet Take 1 tablet (500 mg total) by mouth every 6 (six) hours as needed for muscle spasms.   Omega-3 Fatty Acids (FISH OIL) 1000 MG CAPS Take 1,000 mg by mouth daily.   pioglitazone (ACTOS) 30 MG tablet Take 1 tablet (30 mg total) by mouth every morning.   traMADol (ULTRAM) 50 MG tablet Take 1 tablet (50 mg total) by mouth every 6 (six) hours as needed.   No facility-administered encounter medications on file as of 01/05/2022.    Allergies (verified) Ace inhibitors   History: Past Medical History:  Diagnosis Date   Arthritis    knees    Bilateral  carotid artery disease (HCC)    CAD (coronary artery disease) 02/13/2013   3v CAD; no PCI   Cataract    removed right eye. left eye forming    Diverticulosis    Hyperlipidemia    Hypertension    Hypothyroidism    LVH (left ventricular hypertrophy)    Neuropathy    OSA on CPAP    T2DM (type 2 diabetes mellitus) ([place])    Valvular insufficiency    a.) moderate MR and TR   Venous insufficiency of both lower extremities    Past Surgical History:  Procedure Laterality Date   CARDIAC CATHETERIZATION N/A 02/13/2013   Moderate 3v CAD; no PCI; Location: [facility]; Surgeon: Serafina Royals, MD   CATARACT EXTRACTION W/PHACO Right 11/01/2016   Procedure: CATARACT EXTRACTION PHACO AND INTRAOCULAR LENS PLACEMENT ([place]);  Surgeon: Birder Robson, MD;  Location: [facility] ORS;  Service: Ophthalmology;  Laterality: Right;  Lot #[id number] H [place]: oo:35.4 AP%; 22.4 CDE: 7.91   COLONOSCOPY     CYST REMOVAL NECK     EYE SURGERY     to remove blood from busted blood vessel   HARDWARE REMOVAL Left 04/01/2021   Procedure: Screw removal from left proximal tibia;  Surgeon: Hessie Knows, MD;  Location: [facility] ORS;  Service: Orthopedics;  Laterality: Left;   KNEE ARTHROSCOPY Right 2003, 2005   x2   KNEE ARTHROSCOPY Left 2013   nasal polyps  1978   POLYPECTOMY     TIBIA FRACTURE SURGERY Left 2012   TONSILLECTOMY AND ADENOIDECTOMY     TOTAL KNEE ARTHROPLASTY Left 06/08/2021   Procedure: TOTAL KNEE ARTHROPLASTY;  Surgeon: Hessie Knows, MD;  Location: [facility] OR[facility];  Service: Orthopedics;  Laterality: Left;   UVULOPLASTY N/A 2000   Family History  Problem Relation Age of Onset   Colon cancer Brother 68   Colon cancer Maternal Aunt 8   Colon cancer Maternal Aunt 72   Kidney disease Mother    Anemia Mother    Diabetes Brother    Hyperlipidemia Brother    Hypertension Brother    Colon polyps Brother    Esophageal cancer Neg Hx    Rectal cancer Neg Hx    Stomach cancer Neg Hx    Social History   Socioeconomic History   Marital status: Married    Spouse name: Not on file   Number of children: 3   Years of education: Not on file   Highest education level: Associate degree: occupational, Hotel manager, or vocational program  Occupational History   Occupation: retired  Tobacco Use   Smoking status: Former    Types: Cigarettes    Quit date: 07/24/1979    Years since quitting: 42.4   Smokeless tobacco: Former    Quit date: 11/01/1979  Vaping Use   Vaping Use: Never used  Substance and Sexual Activity   Alcohol use: Not Currently    Comment: wine 2-3xs a year   Drug use: No   Sexual  activity: Not on file  Other Topics Concern   Not on file  Social History Narrative   Not on file   Social Determinants of Health   Financial Resource Strain: Not on file  Food Insecurity: Not on file  Transportation Needs: Not on file  Physical Activity: Not on file  Stress: Not on file  Social Connections: Not on file    Tobacco Counseling Counseling given: Not Answered   Clinical Intake:  Pre-visit preparation completed: Yes  Pain : No/denies pain     Nutritional  Risks: None Diabetes: Yes CBG done?: No Did pt. bring in CBG monitor from home?: No  How often do you need to have someone help you when you read instructions, pamphlets, or other written materials from your doctor or pharmacy?: 1 - Never  Diabetic?yes Nutrition Risk Assessment:  Has the patient had any N/V/D within the last 2 months?  No  Does the patient have any non-healing wounds?  No  Has the patient had any unintentional weight loss or weight gain?  No   Diabetes:  Is the patient diabetic?  Yes  If diabetic, was a CBG obtained today?  No  Did the patient bring in their glucometer from home?  No  How often do you monitor your CBG's? seldom.   Financial Strains and Diabetes Management:  Are you having any financial strains with the device, your supplies or your medication? No .  Does the patient want to be seen by Chronic Care Management for management of their diabetes?  No  Would the patient like to be referred to a Nutritionist or for Diabetic Management?  No   Diabetic Exams:  Diabetic Eye Exam: Completed 11/26/21. Pt has been advised about the importance in completing this exam.   Diabetic Foot Exam: Completed 12/26/19. Pt has been advised about the importance in completing this exam.   Interpreter Needed?: No  Information entered by :: Kirke Shaggy, LPN   Activities of Daily Living In your present state of health, do you have any difficulty performing the following activities:  06/08/2021 05/28/2021  Hearing? N N  Vision? N N  Difficulty concentrating or making decisions? N N  Walking or climbing stairs? Y Y  Dressing or bathing? N N  Doing errands, shopping? N N  Some recent data might be hidden    Patient Care Team: Jerrol Banana., MD as PCP - General (Family Medicine) Birder Robson, MD as Referring Physician (Ophthalmology) Corey Skains, MD as Consulting Physician (Cardiology) Hessie Knows, MD as Consulting Physician (Orthopedic Surgery) Dasher, Rayvon Char, MD (Dermatology) Leandrew Koyanagi, MD as Referring Physician (Ophthalmology)  Indicate any recent Medical Services you may have received from other than [facility] providers in the past year (date may be approximate).     Assessment:   This is a routine wellness examination for [place].  Hearing/Vision screen No results found.  Dietary issues and exercise activities discussed:     Goals Addressed   None    Depression Screen PHQ 2/9 Scores 11/09/2020 11/09/2020 10/07/2020 11/05/2019 11/05/2019 10/22/2018 10/19/2017  PHQ - 2 Score 0 0 0 0 0 0 0  PHQ- 9 Score 0 - 3 - - - -    Fall Risk Fall Risk  11/09/2020 11/09/2020 10/07/2020 11/05/2019 10/22/2018  Falls in the past year? 1 1 1  0 0  Number falls in past yr: 1 1 1  0 0  Comment - - - - -  Injury with Fall? 0 0 0 0 0  Risk for fall due to : - Impaired balance/gait History of fall(s) - -  Follow up - Falls prevention discussed Falls evaluation completed;Education provided - -    FALL RISK PREVENTION PERTAINING TO THE HOME:  Any stairs in or around the home? Yes  If so, are there any without handrails? No  Home free of loose throw rugs in walkways, pet beds, electrical cords, etc? Yes  Adequate lighting in your home to reduce risk of falls? Yes   ASSISTIVE DEVICES UTILIZED TO PREVENT FALLS:  Life  alert? No  Use of a cane, walker or w/c? No  Grab bars in the bathroom? Yes  Shower chair or bench in shower? Yes  Elevated toilet seat  or a handicapped toilet? Yes   TIMED UP AND GO:  Was the test performed? Yes .  Length of time to ambulate 10 feet: 7 sec.   Gait slow and steady without use of assistive device  Cognitive Function:     6CIT Screen 10/22/2018  What Year? 0 points  What month? 0 points  What time? 0 points  Count back from 20 0 points  Months in reverse 0 points  Repeat phrase 0 points  Total Score 0    Immunizations Immunization History  Administered Date(s) Administered   Fluad Quad(high Dose 65+) 08/19/2021   Influenza Split 08/21/2012   Influenza, High Dose Seasonal PF 07/22/2014, 08/17/2017, 07/17/2019, 08/07/2020   Influenza-Unspecified 09/07/2015, 08/15/2018   PFIZER(Purple Top)SARS-COV-2 Vaccination 11/07/2019, 11/28/2019, 08/07/2020   Pneumococcal Conjugate-13 06/16/2014   Pneumococcal Polysaccharide-23 06/18/2012   Td 11/21/2003, 01/24/2015   Zoster Recombinat (Shingrix) 11/15/2021   Zoster, Live 06/18/2012    TDAP status: Up to date  Flu Vaccine status: Up to date  Pneumococcal vaccine status: Up to date  Covid-19 vaccine status: Completed vaccines  Qualifies for Shingles Vaccine? Yes   Zostavax completed Yes   Shingrix Completed?: Yes, due for second shot at [facility]  Screening Tests Health Maintenance  Topic Date Due   COVID-19 Vaccine (4 - Booster for Pfizer series) 10/02/2020   FOOT EXAM  12/25/2020   OPHTHALMOLOGY EXAM  11/26/2021   HEMOGLOBIN A1C  12/25/2021   Zoster Vaccines- Shingrix (2 of 2) 01/10/2022   TETANUS/TDAP  01/23/2025   Pneumonia Vaccine 54+ Years old  Completed   INFLUENZA VACCINE  Completed   HPV VACCINES  Aged Out   COLONOSCOPY (Pts 45-19yrs Insurance coverage will need to be confirmed)  Discontinued    Health Maintenance  Health Maintenance Due  Topic Date Due   COVID-19 Vaccine (4 - Booster for [place] series) 10/02/2020   FOOT EXAM  12/25/2020   OPHTHALMOLOGY EXAM  11/26/2021   HEMOGLOBIN A1C  12/25/2021  Wants Dr.Gilbert to look at  feet during physical in 3 weeks  Colorectal cancer screening: Type of screening: Colonoscopy. Completed 08/29/19. Repeat every AGED OUT years  Lung Cancer Screening: (Low Dose CT Chest recommended if Age 76-80 years, 30 pack-year currently smoking OR have quit w/in 15years.) does not qualify.    Additional Screening:  Hepatitis C Screening: does qualify; Completed NO  Vision Screening: Recommended annual ophthalmology exams for early detection of glaucoma and other disorders of the eye. Is the patient up to date with their annual eye exam?  Yes  Who is the provider or what is the name of the office in which the patient attends annual eye exams? [facility] If pt is not established with a provider, would they like to be referred to a provider to establish care? No .   Dental Screening: Recommended annual dental exams for proper oral hygiene  Community Resource Referral / Chronic Care Management: CRR required this visit?  No   CCM required this visit?  No      Plan:     I have personally reviewed and noted the following in the patient's chart:   Medical and social history Use of alcohol, tobacco or illicit drugs  Current medications and supplements including opioid prescriptions. Patient is currently taking opioid prescriptions. Information provided to patient regarding  non-opioid alternatives. Patient advised to discuss non-opioid treatment plan with their provider. Functional ability and status Nutritional status Physical activity Advanced directives List of other physicians Hospitalizations, surgeries, and ER visits in previous 12 months Vitals Screenings to include cognitive, depression, and falls Referrals and appointments  In addition, I have reviewed and discussed with patient certain preventive protocols, quality metrics, and best practice recommendations. A written personalized care plan for preventive services as well as general preventive health  recommendations were provided to patient.     Dionisio Hayden, LPN   02/05/8890   Nurse Notes: none

## 2022-01-10 DIAGNOSIS — Z20822 Contact with and (suspected) exposure to covid-19: Secondary | ICD-10-CM | POA: Diagnosis not present

## 2022-01-21 NOTE — Progress Notes (Signed)
?  ? ? ?Established patient visit ? ? ?Patient: Zachary Martinez   DOB: 1941-10-27   81 y.o. Male  MRN: [id number] ?Visit Date: 01/24/2022 ? ?Today's healthcare provider: [name], MD  ? ?Chief Complaint  ?Patient presents with  ? Follow-up  ? Diabetes  ? Hypertension  ? Hyperlipidemia  ? ?Subjective  ?  ?HPI  ?Patient feels well.  He sees Dr. Nehemiah Massed from cardiology later this week.  He sees Dr. Evorn Gong from dermatology and Dr. Murvin Natal  for his eye exam which he has had this year. ?He is done fairly well status post a full knee replacement and needs the other knee done at some point time in the future. ?He knows he needs to lose weight and does complain of some early neuropathic symptoms in his feet, he states he feels like he is walking on bubble wrap most of the time.  No sores or drainage. ?Diabetes Mellitus Type II, follow-up ? ?Lab Results  ?Component Value Date  ? HGBA1C 6.2 (A) 06/24/2021  ? HGBA1C 6.9 (A) 01/05/2021  ? HGBA1C 6.9 (A) 10/07/2020  ? ?Last seen for diabetes 7 months ago.  ?Management since then includes continuing the same treatment. ?He reports good compliance with treatment. ?He is not having side effects.  ? ?Home blood sugar records:  not being checked ? ?Episodes of hypoglycemia? No  ?  ?Current insulin regiment: none ?Most Recent Eye Exam: up to date ? ?--------------------------------------------------------------------------------------------------- ?Hypertension, follow-up ? ?BP Readings from Last 3 Encounters:  ?01/24/22 (!) 160/79  ?01/05/22 110/60  ?06/24/21 (!) 117/57  ? Wt Readings from Last 3 Encounters:  ?01/24/22 251 lb (113.9 kg)  ?01/05/22 249 lb 14.4 oz (113.4 kg)  ?06/24/21 235 lb (106.6 kg)  ?  ? ?He was last seen for hypertension 7 months ago.  ?BP at that visit was 117/57. ?Management since that visit includes; Good control on amlodipine and Coreg and hydralazine. ?He reports good compliance with treatment. ?He is not having side effects.  ?He is not  exercising. ?He i adherent to low salt diet.   ?Outside blood pressures are none. ? ?He does not smoke. ? ?Use of agents associated with hypertension: Epi pen when needed ?--------------------------------------------------------------------------------------------------- ?Lipid/Cholesterol, follow-up ? ?Last Lipid Panel: ?Lab Results  ?Component Value Date  ? CHOL 119 05/21/2020  ? [place] 52 05/21/2020  ? HDL 29 (L) 05/21/2020  ? TRIG 234 (H) 05/21/2020  ? ? ?He was last seen for this 05/21/2020.  ?Management since that visit includes; on atorvastatin. ? ?He reports good compliance with treatment. ?He is not having side effects.  ? ?He is following a Regular diet. ?Current exercise: none ? ?Last metabolic panel ?Lab Results  ?Component Value Date  ? GLUCOSE 177 (H) 06/09/2021  ? NA 136 06/09/2021  ? K 3.7 06/09/2021  ? BUN 19 06/09/2021  ? CREATININE 0.81 06/09/2021  ? GFRNONAA >60 06/09/2021  ? CALCIUM 9.2 06/09/2021  ? AST 16 05/28/2021  ? ALT 18 05/28/2021  ? ?The ASCVD Risk score (Arnett DK, et al., 2019) failed to calculate for the following reasons: ?  The patient has a prior MI or stroke diagnosis ? ?--------------------------------------------------------------------------------------------------- ? ? ?Medications: ?Outpatient Medications Prior to Visit  ?Medication Sig  ? amLODipine (NORVASC) 10 MG tablet TAKE 1 TABLET EVERY DAY  ? atorvastatin (LIPITOR) 40 MG tablet TAKE 1 TABLET EVERY DAY  ? carvedilol (COREG) 6.25 MG tablet Take 1 tablet (6.25 mg total) by mouth 2 (two) times daily with a  meal.  ? EPINEPHrine 0.3 mg/0.3 mL IJ SOAJ injection Inject 0.3 mg into the muscle as needed for anaphylaxis.  ? hydrALAZINE (APRESOLINE) 100 MG tablet TAKE 1 TABLET TWICE DAILY  ? INVOKAMET [phone] MG TABS TAKE 1 TABLET TWICE DAILY  ? levothyroxine (SYNTHROID) 88 MCG tablet Take 1 tablet (88 mcg total) by mouth daily before breakfast.  ? pioglitazone (ACTOS) 30 MG tablet Take 1 tablet (30 mg total) by mouth every  morning.  ? [DISCONTINUED] cephALEXin (KEFLEX) 500 MG capsule Take 4 pills one hour prior to dental appointment. (Patient not taking: Reported on 01/05/2022)  ? [DISCONTINUED] Cholecalciferol (VITAMIN D3) 50 MCG (2000 UT) capsule Take 2,000 Units by mouth daily.  ? [DISCONTINUED] docusate sodium (COLACE) 100 MG capsule Take 1 capsule (100 mg total) by mouth 2 (two) times daily. (Patient not taking: Reported on 01/05/2022)  ? [DISCONTINUED] enoxaparin (LOVENOX) 40 MG/0.4ML injection Inject 0.4 mLs (40 mg total) into the skin daily for 14 days. (Patient not taking: Reported on 06/24/2021)  ? [DISCONTINUED] HYDROcodone-acetaminophen (NORCO/VICODIN) 5-325 MG tablet Take 1-2 tablets by mouth every 6 (six) hours as needed for moderate pain (pain score 4-6). (Patient not taking: Reported on 01/05/2022)  ? [DISCONTINUED] methocarbamol (ROBAXIN) 500 MG tablet Take 1 tablet (500 mg total) by mouth every 6 (six) hours as needed for muscle spasms. (Patient not taking: Reported on 01/05/2022)  ? [DISCONTINUED] molnupiravir EUA (LAGEVRIO) 200 MG CAPS capsule Take by mouth. (Patient not taking: Reported on 01/05/2022)  ? [DISCONTINUED] Omega-3 Fatty Acids (FISH OIL) 1000 MG CAPS Take 1,000 mg by mouth daily. (Patient not taking: Reported on 01/05/2022)  ? [DISCONTINUED] traMADol (ULTRAM) 50 MG tablet Take 1 tablet (50 mg total) by mouth every 6 (six) hours as needed. (Patient not taking: Reported on 01/05/2022)  ? ?No facility-administered medications prior to visit.  ? ? ?Review of Systems  ?Constitutional:  Negative for appetite change, chills and fever.  ?Respiratory:  Negative for chest tightness, shortness of breath and wheezing.   ?Cardiovascular:  Negative for chest pain and palpitations.  ?Gastrointestinal:  Negative for abdominal pain, nausea and vomiting.  ? ?  ?  Objective  ?  ?BP (!) 160/79 (BP Location: Left Arm, Patient Position: Sitting, Cuff Size: Large)   Pulse (!) 52   Temp 97.9 ?F (36.6 ?C) (Oral)   Wt 251 lb (113.9 kg)    SpO2 98%   BMI 37.07 kg/m?  ?  ? ?Physical Exam ?Vitals reviewed.  ?Constitutional:   ?   Appearance: He is well-developed.  ?HENT:  ?   Head: Normocephalic and atraumatic.  ?   Right Ear: External ear normal.  ?   Left Ear: External ear normal.  ?   Nose: Nose normal.  ?Eyes:  ?   General: No scleral icterus. ?   Conjunctiva/sclera: Conjunctivae normal.  ?Neck:  ?   Thyroid: No thyromegaly.  ?Cardiovascular:  ?   Rate and Rhythm: Normal rate and regular rhythm.  ?   Heart sounds: Normal heart sounds.  ?Pulmonary:  ?   Effort: Pulmonary effort is normal.  ?   Breath sounds: Normal breath sounds.  ?Abdominal:  ?   Palpations: Abdomen is soft.  ?Genitourinary: ?   Penis: Normal.   ?   Testes: Normal.  ?   Comments: Left inguinal hernia ?Musculoskeletal:  ?   Right lower leg: Edema present.  ?   Left lower leg: Edema present.  ?Skin: ?   General: Skin is warm and dry.  ?  Comments: He has venous stasis skin changes of the left lower extremity much more than the right lower extremity.  This leg had a traumatic fracture and 2 surgeries about 10 years ago.  ?Neurological:  ?   General: No focal deficit present.  ?   Mental Status: He is alert and oriented to person, place, and time.  ?Psychiatric:     ?   Mood and Affect: Mood normal.     ?   Behavior: Behavior normal.     ?   Thought Content: Thought content normal.     ?   Judgment: Judgment normal.  ?  ? ? ?No results found for any visits on 01/24/22. ? Assessment & Plan  ?  ? ?1. Essential hypertension ? ?- Lipid panel ?- TSH ?- CBC w/Diff/Platelet ?- Comprehensive Metabolic Panel (CMET) ?- Hemoglobin A1c ? ?2. Other hyperlipidemia ? ?- Lipid panel ?- TSH ?- CBC w/Diff/Platelet ?- Comprehensive Metabolic Panel (CMET) ?- Hemoglobin A1c ? ?3. Type 2 diabetes mellitus with diabetic neuropathy, without long-term current use of insulin ([place]) ?Now with mild diabetic neuropathy of both feet ?- Lipid panel ?- TSH ?- CBC w/Diff/Platelet ?- Comprehensive Metabolic Panel  (CMET) ?- Hemoglobin A1c ? ?4. Adult hypothyroidism ? ?- Lipid panel ?- TSH ?- CBC w/Diff/Platelet ?- Comprehensive Metabolic Panel (CMET) ?- Hemoglobin A1c ? ?5. CAD in native artery ? ?- Lipid panel ?- TSH ?- CBC w/Diff/Pl

## 2022-01-24 ENCOUNTER — Other Ambulatory Visit: Payer: Self-pay

## 2022-01-24 ENCOUNTER — Ambulatory Visit (INDEPENDENT_AMBULATORY_CARE_PROVIDER_SITE_OTHER): Payer: Medicare Other | Admitting: Family Medicine

## 2022-01-24 VITALS — BP 160/79 | HR 52 | Temp 97.9°F | Wt 251.0 lb

## 2022-01-24 DIAGNOSIS — G4733 Obstructive sleep apnea (adult) (pediatric): Secondary | ICD-10-CM | POA: Diagnosis not present

## 2022-01-24 DIAGNOSIS — E039 Hypothyroidism, unspecified: Secondary | ICD-10-CM | POA: Diagnosis not present

## 2022-01-24 DIAGNOSIS — Z6835 Body mass index (BMI) 35.0-35.9, adult: Secondary | ICD-10-CM

## 2022-01-24 DIAGNOSIS — K4090 Unilateral inguinal hernia, without obstruction or gangrene, not specified as recurrent: Secondary | ICD-10-CM

## 2022-01-24 DIAGNOSIS — Z96652 Presence of left artificial knee joint: Secondary | ICD-10-CM

## 2022-01-24 DIAGNOSIS — E1140 Type 2 diabetes mellitus with diabetic neuropathy, unspecified: Secondary | ICD-10-CM

## 2022-01-24 DIAGNOSIS — I2510 Atherosclerotic heart disease of native coronary artery without angina pectoris: Secondary | ICD-10-CM | POA: Diagnosis not present

## 2022-01-24 DIAGNOSIS — I10 Essential (primary) hypertension: Secondary | ICD-10-CM | POA: Diagnosis not present

## 2022-01-24 DIAGNOSIS — E7849 Other hyperlipidemia: Secondary | ICD-10-CM

## 2022-01-25 LAB — CBC WITH DIFFERENTIAL/PLATELET
Basophils Absolute: 0 10*3/uL (ref 0.0–0.2)
Basos: 1 %
EOS (ABSOLUTE): 0.1 10*3/uL (ref 0.0–0.4)
Eos: 3 %
Hematocrit: 42.8 % (ref 37.5–51.0)
Hemoglobin: 14.2 g/dL (ref 13.0–17.7)
Immature Grans (Abs): 0 10*3/uL (ref 0.0–0.1)
Immature Granulocytes: 0 %
Lymphocytes Absolute: 1 10*3/uL (ref 0.7–3.1)
Lymphs: 20 %
MCH: 31 pg (ref 26.6–33.0)
MCHC: 33.2 g/dL (ref 31.5–35.7)
MCV: 93 fL (ref 79–97)
Monocytes Absolute: 0.4 10*3/uL (ref 0.1–0.9)
Monocytes: 7 %
Neutrophils Absolute: 3.7 10*3/uL (ref 1.4–7.0)
Neutrophils: 69 %
Platelets: 170 10*3/uL (ref 150–450)
RBC: 4.58 x10E6/uL (ref 4.14–5.80)
RDW: 14.2 % (ref 11.6–15.4)
WBC: 5.3 10*3/uL (ref 3.4–10.8)

## 2022-01-25 LAB — COMPREHENSIVE METABOLIC PANEL
ALT: 19 IU/L (ref 0–44)
AST: 14 IU/L (ref 0–40)
Albumin/Globulin Ratio: 1.9 (ref 1.2–2.2)
Albumin: 4 g/dL (ref 3.7–4.7)
Alkaline Phosphatase: 96 IU/L (ref 44–121)
BUN/Creatinine Ratio: 17 (ref 10–24)
BUN: 12 mg/dL (ref 8–27)
Bilirubin Total: 0.5 mg/dL (ref 0.0–1.2)
CO2: 22 mmol/L (ref 20–29)
Calcium: 10.8 mg/dL — ABNORMAL HIGH (ref 8.6–10.2)
Chloride: 107 mmol/L — ABNORMAL HIGH (ref 96–106)
Creatinine, Ser: 0.71 mg/dL — ABNORMAL LOW (ref 0.76–1.27)
Globulin, Total: 2.1 g/dL (ref 1.5–4.5)
Glucose: 147 mg/dL — ABNORMAL HIGH (ref 70–99)
Potassium: 4.6 mmol/L (ref 3.5–5.2)
Sodium: 140 mmol/L (ref 134–144)
Total Protein: 6.1 g/dL (ref 6.0–8.5)
eGFR: 93 mL/min/{1.73_m2} (ref >59)

## 2022-01-25 LAB — LIPID PANEL
Chol/HDL Ratio: 3.6 ratio (ref 0.0–5.0)
Cholesterol, Total: 118 mg/dL (ref 100–199)
HDL: 33 mg/dL — ABNORMAL LOW (ref >39)
LDL Chol Calc (NIH): 55 mg/dL (ref 0–99)
Triglycerides: 182 mg/dL — ABNORMAL HIGH (ref 0–149)
VLDL Cholesterol Cal: 30 mg/dL (ref 5–40)

## 2022-01-25 LAB — TSH: TSH: 2.99 u[IU]/mL (ref 0.450–4.500)

## 2022-01-25 LAB — HEMOGLOBIN A1C
Est. average glucose Bld gHb Est-mCnc: 143 mg/dL
Hgb A1c MFr Bld: 6.6 % — ABNORMAL HIGH (ref 4.8–5.6)

## 2022-01-26 DIAGNOSIS — R001 Bradycardia, unspecified: Secondary | ICD-10-CM | POA: Diagnosis not present

## 2022-01-26 DIAGNOSIS — E119 Type 2 diabetes mellitus without complications: Secondary | ICD-10-CM | POA: Diagnosis not present

## 2022-01-26 DIAGNOSIS — I10 Essential (primary) hypertension: Secondary | ICD-10-CM | POA: Diagnosis not present

## 2022-01-26 DIAGNOSIS — E782 Mixed hyperlipidemia: Secondary | ICD-10-CM | POA: Diagnosis not present

## 2022-01-26 DIAGNOSIS — I779 Disorder of arteries and arterioles, unspecified: Secondary | ICD-10-CM | POA: Diagnosis not present

## 2022-01-26 DIAGNOSIS — Z6836 Body mass index (BMI) 36.0-36.9, adult: Secondary | ICD-10-CM | POA: Diagnosis not present

## 2022-01-26 DIAGNOSIS — I340 Nonrheumatic mitral (valve) insufficiency: Secondary | ICD-10-CM | POA: Diagnosis not present

## 2022-01-26 DIAGNOSIS — I119 Hypertensive heart disease without heart failure: Secondary | ICD-10-CM | POA: Diagnosis not present

## 2022-01-26 DIAGNOSIS — I2510 Atherosclerotic heart disease of native coronary artery without angina pectoris: Secondary | ICD-10-CM | POA: Diagnosis not present

## 2022-01-26 DIAGNOSIS — G4733 Obstructive sleep apnea (adult) (pediatric): Secondary | ICD-10-CM | POA: Diagnosis not present

## 2022-02-21 ENCOUNTER — Other Ambulatory Visit: Payer: Self-pay | Admitting: Family Medicine

## 2022-02-21 DIAGNOSIS — C44121 Squamous cell carcinoma of skin of unspecified eyelid, including canthus: Secondary | ICD-10-CM | POA: Diagnosis not present

## 2022-02-21 DIAGNOSIS — C44519 Basal cell carcinoma of skin of other part of trunk: Secondary | ICD-10-CM | POA: Diagnosis not present

## 2022-02-21 DIAGNOSIS — Z85828 Personal history of other malignant neoplasm of skin: Secondary | ICD-10-CM | POA: Diagnosis not present

## 2022-02-21 DIAGNOSIS — L570 Actinic keratosis: Secondary | ICD-10-CM | POA: Diagnosis not present

## 2022-02-21 DIAGNOSIS — D485 Neoplasm of uncertain behavior of skin: Secondary | ICD-10-CM | POA: Diagnosis not present

## 2022-02-21 DIAGNOSIS — D2271 Melanocytic nevi of right lower limb, including hip: Secondary | ICD-10-CM | POA: Diagnosis not present

## 2022-02-21 DIAGNOSIS — D2262 Melanocytic nevi of left upper limb, including shoulder: Secondary | ICD-10-CM | POA: Diagnosis not present

## 2022-02-21 DIAGNOSIS — D2261 Melanocytic nevi of right upper limb, including shoulder: Secondary | ICD-10-CM | POA: Diagnosis not present

## 2022-02-21 DIAGNOSIS — I10 Essential (primary) hypertension: Secondary | ICD-10-CM

## 2022-02-21 DIAGNOSIS — X32XXXA Exposure to sunlight, initial encounter: Secondary | ICD-10-CM | POA: Diagnosis not present

## 2022-02-25 DIAGNOSIS — Z20822 Contact with and (suspected) exposure to covid-19: Secondary | ICD-10-CM | POA: Diagnosis not present

## 2022-04-15 ENCOUNTER — Telehealth: Payer: Self-pay

## 2022-04-15 NOTE — Telephone Encounter (Signed)
Copied from [place] [id number]. Topic: General - Inquiry >> Apr 15, 2022  3:08 PM Erskine Squibb wrote: Reason for CRM: I received a call from [place] with [employer] checking in on the status of a fax that was sent on Tuesday the 16th regarding cpap supplies. She states she is going to resend. Please assist further.

## 2022-04-15 NOTE — Telephone Encounter (Signed)
Noted  

## 2022-04-26 DIAGNOSIS — D485 Neoplasm of uncertain behavior of skin: Secondary | ICD-10-CM | POA: Diagnosis not present

## 2022-04-26 DIAGNOSIS — D044 Carcinoma in situ of skin of scalp and neck: Secondary | ICD-10-CM | POA: Diagnosis not present

## 2022-04-26 DIAGNOSIS — C441292 Squamous cell carcinoma of skin of left lower eyelid, including canthus: Secondary | ICD-10-CM | POA: Diagnosis not present

## 2022-04-26 DIAGNOSIS — C44329 Squamous cell carcinoma of skin of other parts of face: Secondary | ICD-10-CM | POA: Diagnosis not present

## 2022-04-27 NOTE — Telephone Encounter (Signed)
Zachary Martinez from [employer] called in checking states for order of cpap supplies. Please call back

## 2022-04-27 NOTE — Telephone Encounter (Signed)
Fax # is [phone]

## 2022-04-29 NOTE — Telephone Encounter (Signed)
Spoke to [place] who reports that forms where received on 04/15/22. They will process orders.

## 2022-05-05 DIAGNOSIS — D235 Other benign neoplasm of skin of trunk: Secondary | ICD-10-CM | POA: Diagnosis not present

## 2022-05-05 DIAGNOSIS — C44519 Basal cell carcinoma of skin of other part of trunk: Secondary | ICD-10-CM | POA: Diagnosis not present

## 2022-05-18 ENCOUNTER — Other Ambulatory Visit: Payer: Self-pay | Admitting: Family Medicine

## 2022-05-18 DIAGNOSIS — E7849 Other hyperlipidemia: Secondary | ICD-10-CM

## 2022-05-19 DIAGNOSIS — D044 Carcinoma in situ of skin of scalp and neck: Secondary | ICD-10-CM | POA: Diagnosis not present

## 2022-06-10 IMAGING — XA DG C-ARM 1-60 MIN
1 series · 2 of 2 positions shown · non-contrast
Comparison: 10/18/2011.

CLINICAL DATA: Screw removal from left lower extremity.

EXAM:
DG C-ARM 1-60 MIN; LEFT TIBIA AND FIBULA - 2 VIEW
FLUOROSCOPY TIME:  Fluoroscopy Time:  2 minutes and 19 seconds.
Number of Acquired Spot Images: 2

[Series 1: unknown protocol · 0.20mm/px · 2 of 2 slices shown]
[im 1/2]
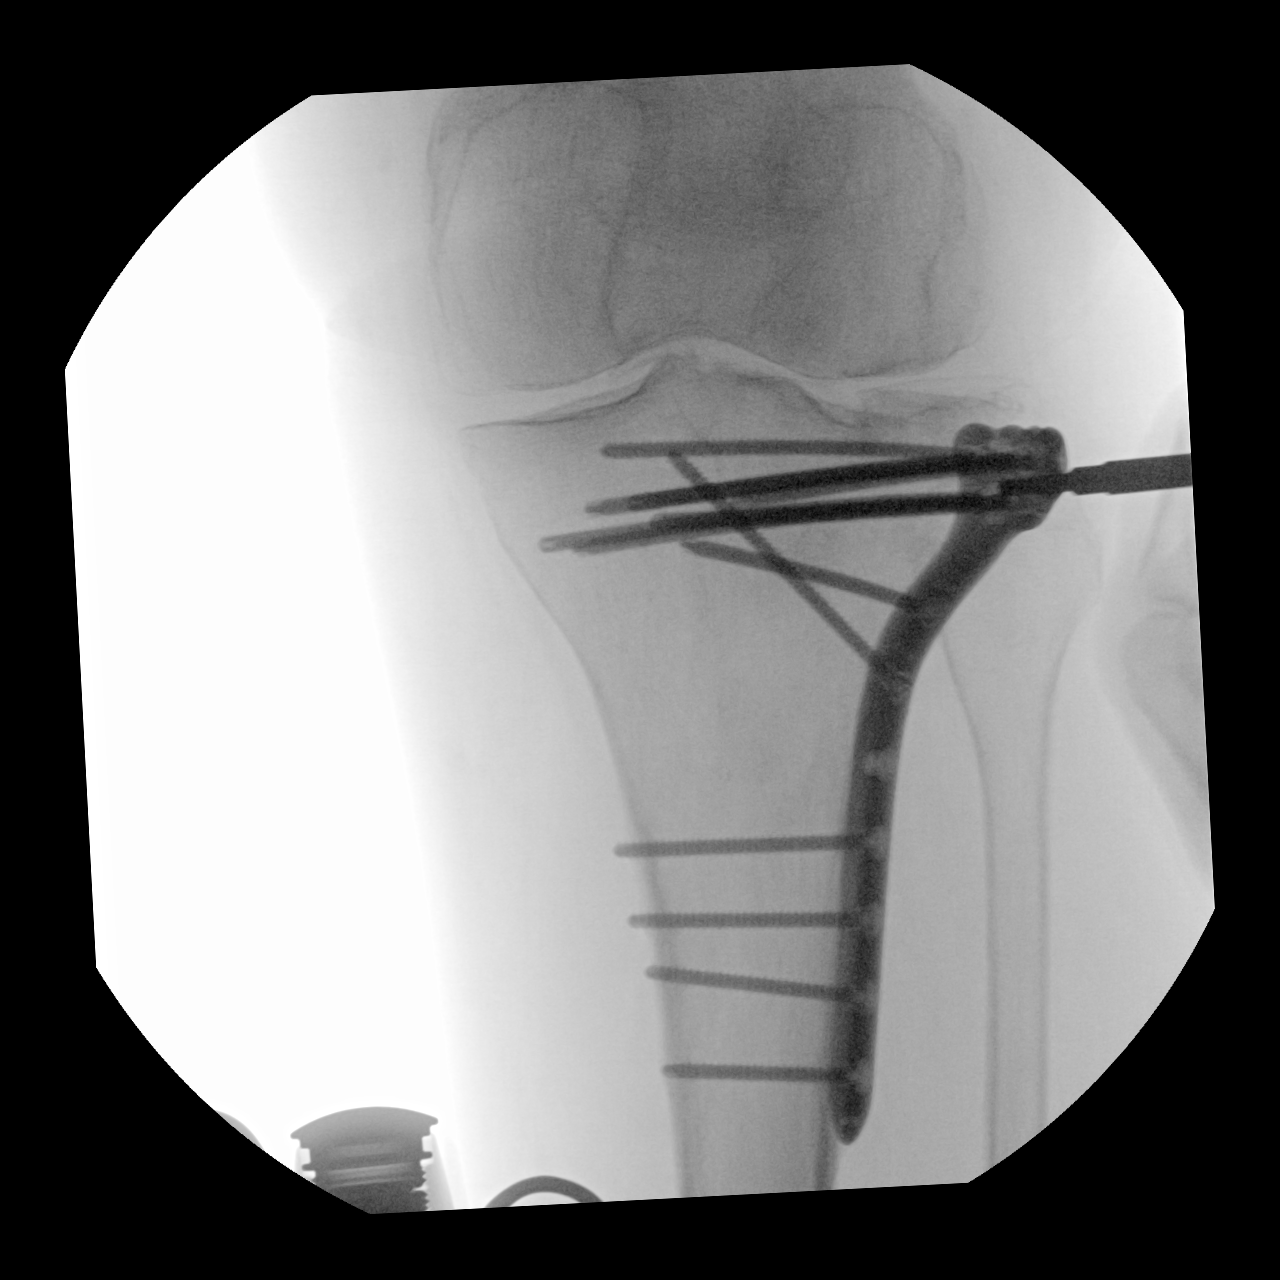
[im 2/2]
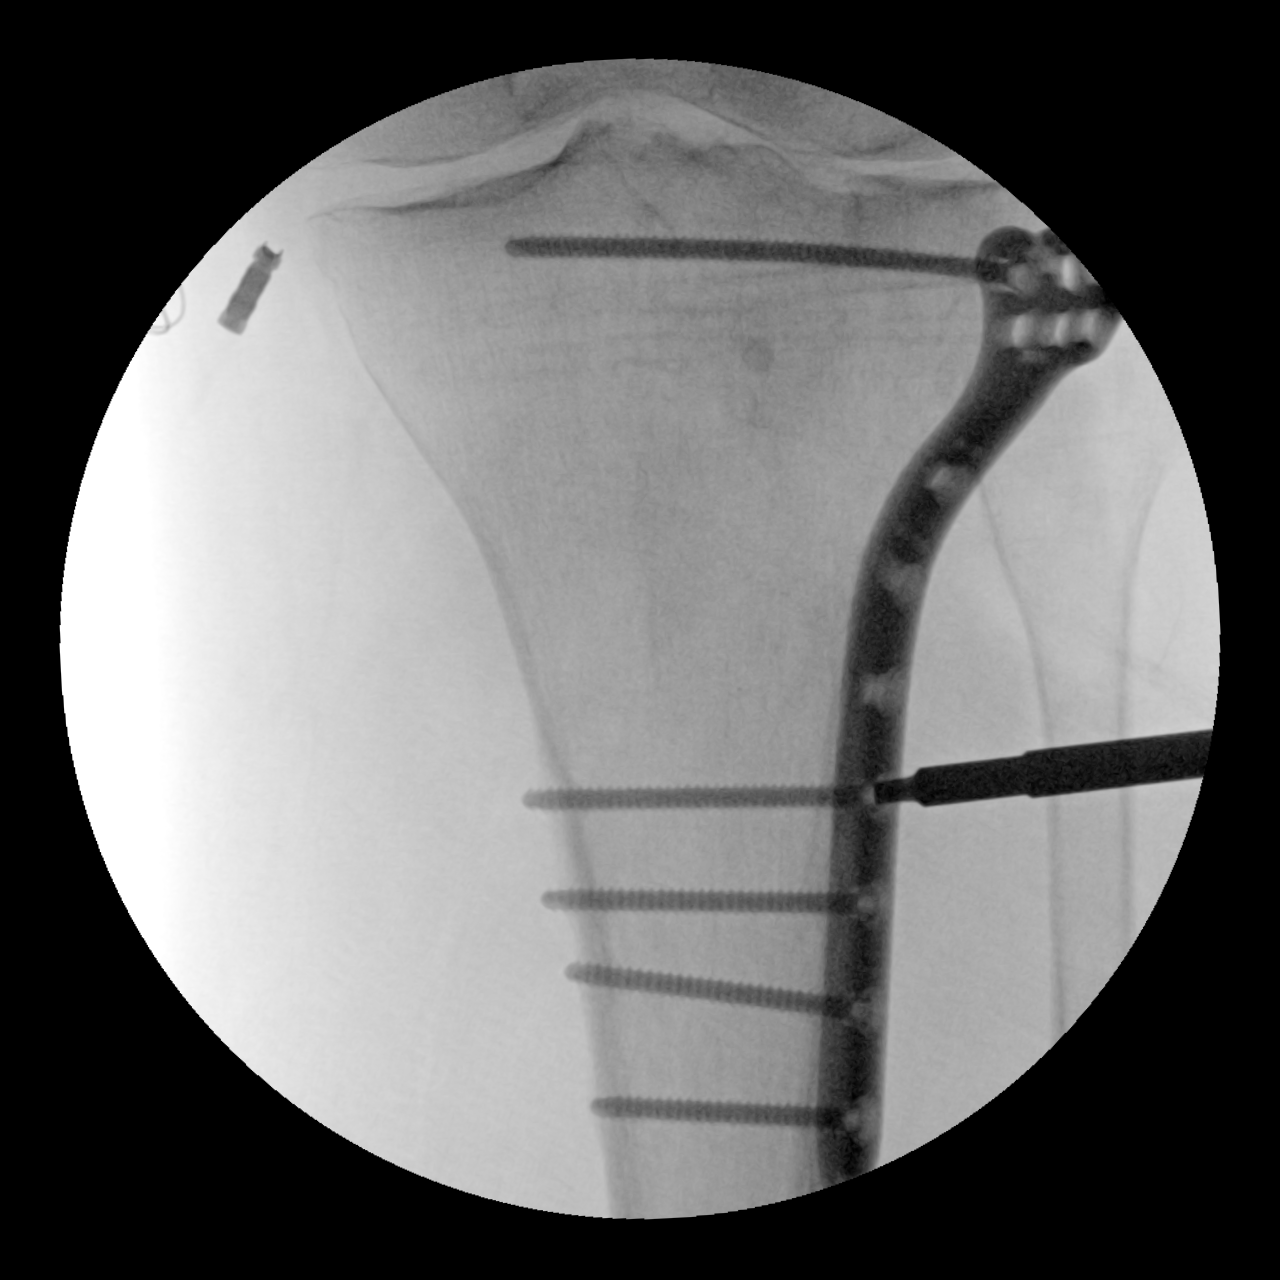

[2 of 2 positions shown; findings below may reference images not displayed]

FINDINGS: Two C-arm fluoroscopic images were obtained intraoperatively and
submitted for post operative interpretation. These images
demonstrate removal of multiple screws from the tibial plate screw
fixation. No unexpected findings. Please see the performing
provider's procedural report for further detail.
IMPRESSION: Intraoperative fluoroscopy, as detailed above.

## 2022-07-05 ENCOUNTER — Ambulatory Visit (INDEPENDENT_AMBULATORY_CARE_PROVIDER_SITE_OTHER): Payer: Medicare Other | Admitting: Family Medicine

## 2022-07-05 ENCOUNTER — Encounter: Payer: Self-pay | Admitting: Family Medicine

## 2022-07-05 VITALS — BP 132/51 | HR 58 | Resp 16 | Wt 248.0 lb

## 2022-07-05 DIAGNOSIS — G4733 Obstructive sleep apnea (adult) (pediatric): Secondary | ICD-10-CM | POA: Diagnosis not present

## 2022-07-05 DIAGNOSIS — I2510 Atherosclerotic heart disease of native coronary artery without angina pectoris: Secondary | ICD-10-CM

## 2022-07-05 DIAGNOSIS — Z6836 Body mass index (BMI) 36.0-36.9, adult: Secondary | ICD-10-CM | POA: Diagnosis not present

## 2022-07-05 DIAGNOSIS — I10 Essential (primary) hypertension: Secondary | ICD-10-CM | POA: Diagnosis not present

## 2022-07-05 DIAGNOSIS — Z96652 Presence of left artificial knee joint: Secondary | ICD-10-CM | POA: Diagnosis not present

## 2022-07-05 DIAGNOSIS — I872 Venous insufficiency (chronic) (peripheral): Secondary | ICD-10-CM

## 2022-07-05 DIAGNOSIS — E1140 Type 2 diabetes mellitus with diabetic neuropathy, unspecified: Secondary | ICD-10-CM

## 2022-07-05 LAB — POCT GLYCOSYLATED HEMOGLOBIN (HGB A1C)
Est. average glucose Bld gHb Est-mCnc: 140
Hemoglobin A1C: 6.5 % — AB (ref 4.0–5.6)

## 2022-07-05 NOTE — Progress Notes (Signed)
Established patient visit  I,Zachary Martinez,acting as a scribe for Zachary Durie, MD.,have documented all relevant documentation on the behalf of Zachary Durie, MD,as directed by  Zachary Durie, MD while in the presence of Zachary Durie, MD.   Patient: Zachary Martinez   DOB: [birth year]   81 y.o. Male  MRN: [id number] Visit Date: 07/05/2022  Today's healthcare provider: [name], MD   Chief Complaint  Patient presents with   Follow-up   Hypertension   Diabetes   Subjective    HPI  Patient comes in today for follow-up.  Overall he is feeling very well. He is more unsteady on his feet but that has become a slowly progressive problem.  We discussed at length.  Discussed exercises to keep his strength up and to help his balance.  He has had no recent falls.  Diabetes Mellitus Type II, Follow-up  Lab Results  Component Value Date   HGBA1C 6.5 (A) 07/05/2022   HGBA1C 6.6 (H) 01/24/2022   HGBA1C 6.2 (A) 06/24/2021   Wt Readings from Last 3 Encounters:  07/05/22 248 lb (112.5 kg)  01/24/22 251 lb (113.9 kg)  01/05/22 249 lb 14.4 oz (113.4 kg)   Last seen for diabetes 6 months ago.  Management since then includes none.   Home blood sugar records: fasting range: 140  Most Recent Eye Exam: 11/30/21 Patient is due for diabetic foot exam and urine micro-albumin   Pertinent Labs: Lab Results  Component Value Date   CHOL 118 01/24/2022   HDL 33 (L) 01/24/2022   LDLCALC 55 01/24/2022   TRIG 182 (H) 01/24/2022   CHOLHDL 3.6 01/24/2022   Lab Results  Component Value Date   NA 140 01/24/2022   K 4.6 01/24/2022   CREATININE 0.71 (L) 01/24/2022   EGFR 93 01/24/2022   MICROALBUR negative 05/20/2020     --------------------------------------------------------------------------------------------------- Hypertension, follow-up  BP Readings from Last 3 Encounters:  07/05/22 (!) 132/51  01/24/22 (!) 160/79  01/05/22 110/60   Wt Readings from  Last 3 Encounters:  07/05/22 248 lb (112.5 kg)  01/24/22 251 lb (113.9 kg)  01/05/22 249 lb 14.4 oz (113.4 kg)     He was last seen for hypertension 6 months ago.  BP at that visit was as above. Management since that visit includes none.  Outside blood pressures are not checking.  Pertinent labs Lab Results  Component Value Date   CHOL 118 01/24/2022   HDL 33 (L) 01/24/2022   LDLCALC 55 01/24/2022   TRIG 182 (H) 01/24/2022   CHOLHDL 3.6 01/24/2022   Lab Results  Component Value Date   NA 140 01/24/2022   K 4.6 01/24/2022   CREATININE 0.71 (L) 01/24/2022   EGFR 93 01/24/2022   GLUCOSE 147 (H) 01/24/2022   TSH 2.990 01/24/2022     The ASCVD Risk score (Arnett DK, et al., 2019) failed to calculate for the following reasons:   The patient has a prior MI or stroke diagnosis  ---------------------------------------------------------------------------------------------------   Medications: Outpatient Medications Prior to Visit  Medication Sig   amLODipine (NORVASC) 10 MG tablet TAKE 1 TABLET EVERY DAY   atorvastatin (LIPITOR) 40 MG tablet TAKE 1 TABLET EVERY DAY   carvedilol (COREG) 6.25 MG tablet Take 1 tablet (6.25 mg total) by mouth 2 (two) times daily with a meal.   EPINEPHrine 0.3 mg/0.3 mL IJ SOAJ injection Inject 0.3 mg into the muscle as needed for anaphylaxis.   hydrALAZINE (APRESOLINE)  100 MG tablet TAKE 1 TABLET TWICE DAILY   INVOKAMET [phone] MG TABS TAKE 1 TABLET TWICE DAILY   levothyroxine (SYNTHROID) 88 MCG tablet Take 1 tablet (88 mcg total) by mouth daily before breakfast.   pioglitazone (ACTOS) 30 MG tablet Take 1 tablet (30 mg total) by mouth every morning.   No facility-administered medications prior to visit.    Review of Systems  Last hemoglobin A1c Lab Results  Component Value Date   HGBA1C 6.5 (A) 07/05/2022       Objective    BP (!) 132/51 (BP Location: Left Arm, Patient Position: Sitting, Cuff Size: Large)   Pulse (!) 58   Resp 16    Wt 248 lb (112.5 kg)   SpO2 98%   BMI 36.62 kg/m  BP Readings from Last 3 Encounters:  07/05/22 (!) 132/51  01/24/22 (!) 160/79  01/05/22 110/60   Wt Readings from Last 3 Encounters:  07/05/22 248 lb (112.5 kg)  01/24/22 251 lb (113.9 kg)  01/05/22 249 lb 14.4 oz (113.4 kg)      Physical Exam Vitals reviewed.  Constitutional:      Appearance: He is well-developed.  HENT:     Head: Normocephalic and atraumatic.     Right Ear: External ear normal.     Left Ear: External ear normal.     Nose: Nose normal.  Eyes:     General: No scleral icterus.    Conjunctiva/sclera: Conjunctivae normal.  Neck:     Thyroid: No thyromegaly.  Cardiovascular:     Rate and Rhythm: Normal rate and regular rhythm.     Heart sounds: Normal heart sounds.  Pulmonary:     Effort: Pulmonary effort is normal.     Breath sounds: Normal breath sounds.  Abdominal:     Palpations: Abdomen is soft.  Musculoskeletal:     Right lower leg: Edema present.     Left lower leg: Edema present.  Skin:    General: Skin is warm and dry.     Comments: He has venous stasis skin changes of the left lower extremity much more than the right lower extremity.  This leg had a traumatic fracture and 2 surgeries about 10 years ago.  Neurological:     General: No focal deficit present.     Mental Status: He is alert and oriented to person, place, and time.  Psychiatric:        Mood and Affect: Mood normal.        Behavior: Behavior normal.        Thought Content: Thought content normal.        Judgment: Judgment normal.       Results for orders placed or performed in visit on 07/05/22  POCT glycosylated hemoglobin (Hb A1C)  Result Value Ref Range   Hemoglobin A1C 6.5 (A) 4.0 - 5.6 %   Est. average glucose Bld gHb Est-mCnc 140     Assessment & Plan     1. Type 2 diabetes mellitus with diabetic neuropathy, without long-term current use of insulin (HCC) A1c under good control at 6.5 today.  No hypoglycemia.   Continue present medications of invokeamet and pioglitazone. - Microalbumin / creatinine urine ratio - POCT glycosylated hemoglobin (Hb A1C)  2. Essential hypertension Good blood pressure control. - Microalbumin / creatinine urine ratio  3. Venous insufficiency of both lower extremities Wear support hose when possible  4. CAD in native artery All risk factors treated  5. Obstructive apnea Use CPAP  6. Class 2 severe obesity due to excess calories with serious comorbidity and body mass index (BMI) of 36.0 to 36.9 in adult ([place]) Sinew to work on good lifestyle  7. S/P TKR (total knee replacement) using cement, left    No follow-ups on file.      I, Zachary Durie, MD, have reviewed all documentation for this visit. The documentation on 07/07/22 for the exam, diagnosis, procedures, and orders are all accurate and complete.    Navi Erber Cranford Mon, MD  [facility] [phone] (phone) [phone] (fax)  [place]

## 2022-07-06 LAB — MICROALBUMIN / CREATININE URINE RATIO
Creatinine, Urine: 81.2 mg/dL
Microalb/Creat Ratio: 11 mg/g creat (ref 0–29)
Microalbumin, Urine: 9.1 ug/mL

## 2022-07-27 ENCOUNTER — Ambulatory Visit: Payer: Medicare Other | Admitting: Family Medicine

## 2022-07-27 DIAGNOSIS — E1142 Type 2 diabetes mellitus with diabetic polyneuropathy: Secondary | ICD-10-CM | POA: Diagnosis not present

## 2022-07-27 DIAGNOSIS — E782 Mixed hyperlipidemia: Secondary | ICD-10-CM | POA: Diagnosis not present

## 2022-07-27 DIAGNOSIS — I6523 Occlusion and stenosis of bilateral carotid arteries: Secondary | ICD-10-CM | POA: Diagnosis not present

## 2022-07-27 DIAGNOSIS — I2510 Atherosclerotic heart disease of native coronary artery without angina pectoris: Secondary | ICD-10-CM | POA: Diagnosis not present

## 2022-07-27 DIAGNOSIS — G4733 Obstructive sleep apnea (adult) (pediatric): Secondary | ICD-10-CM | POA: Diagnosis not present

## 2022-07-27 DIAGNOSIS — I119 Hypertensive heart disease without heart failure: Secondary | ICD-10-CM | POA: Diagnosis not present

## 2022-07-27 DIAGNOSIS — I10 Essential (primary) hypertension: Secondary | ICD-10-CM | POA: Diagnosis not present

## 2022-08-10 ENCOUNTER — Encounter: Payer: Self-pay | Admitting: Physician Assistant

## 2022-08-10 ENCOUNTER — Ambulatory Visit: Payer: Self-pay | Admitting: *Deleted

## 2022-08-10 ENCOUNTER — Ambulatory Visit (INDEPENDENT_AMBULATORY_CARE_PROVIDER_SITE_OTHER): Payer: Medicare Other | Admitting: Physician Assistant

## 2022-08-10 VITALS — BP 150/54 | HR 52 | Resp 16 | Wt 246.0 lb

## 2022-08-10 DIAGNOSIS — R1032 Left lower quadrant pain: Secondary | ICD-10-CM

## 2022-08-10 DIAGNOSIS — R1030 Lower abdominal pain, unspecified: Secondary | ICD-10-CM

## 2022-08-10 LAB — POCT URINALYSIS DIPSTICK
Bilirubin, UA: NEGATIVE
Blood, UA: NEGATIVE
Glucose, UA: POSITIVE — AB
Ketones, UA: NEGATIVE
Leukocytes, UA: NEGATIVE
Nitrite, UA: NEGATIVE
Protein, UA: NEGATIVE
Spec Grav, UA: 1.02 (ref 1.010–1.025)
Urobilinogen, UA: 0.2 E.U./dL
pH, UA: 6 (ref 5.0–8.0)

## 2022-08-10 NOTE — Progress Notes (Signed)
I,[name],acting as a Education administrator for [employer], PA-C.,have documented all relevant documentation on the behalf of Mardene Speak, PA-C,as directed by  [employer], PA-C while in the presence of [employer], PA-C.   Established patient visit   Patient: Zachary Martinez   DOB: 02-Nov-1940   81 y.o. Male  MRN: [id number] Visit Date: 08/10/2022  Today's healthcare provider: Mardene Speak, PA-C   Chief Complaint  Patient presents with   Abdominal Pain   Subjective    HPI  Patient has had left sided abdominal pain for 5 days. LLQ. Pain is constant, worse over weekend, area tender with palpation. No back pain, no diarrhea, no fever, no urination pain, no vomiting.  Medications: Outpatient Medications Prior to Visit  Medication Sig   amLODipine (NORVASC) 10 MG tablet TAKE 1 TABLET EVERY DAY   atorvastatin (LIPITOR) 40 MG tablet TAKE 1 TABLET EVERY DAY   carvedilol (COREG) 6.25 MG tablet Take 1 tablet (6.25 mg total) by mouth 2 (two) times daily with a meal.   EPINEPHrine 0.3 mg/0.3 mL IJ SOAJ injection Inject 0.3 mg into the muscle as needed for anaphylaxis.   hydrALAZINE (APRESOLINE) 100 MG tablet TAKE 1 TABLET TWICE DAILY   INVOKAMET [phone] MG TABS TAKE 1 TABLET TWICE DAILY   levothyroxine (SYNTHROID) 88 MCG tablet Take 1 tablet (88 mcg total) by mouth daily before breakfast.   pioglitazone (ACTOS) 30 MG tablet Take 1 tablet (30 mg total) by mouth every morning.   No facility-administered medications prior to visit.    Review of Systems  Constitutional:  Negative for appetite change, chills and fever.  Respiratory:  Negative for chest tightness, shortness of breath and wheezing.   Cardiovascular:  Negative for chest pain and palpitations.  Gastrointestinal:  Negative for abdominal pain, nausea and vomiting.       Objective    BP (!) 150/54 (BP Location: Right Arm, Patient Position: Sitting, Cuff Size: Large)   Pulse (!) 52   Resp 16   Wt 246 lb (111.6 kg)   SpO2  95%   BMI 36.33 kg/m    Physical Exam Vitals reviewed.  Constitutional:      General: He is not in acute distress.    Appearance: Normal appearance. He is not diaphoretic.  HENT:     Head: Normocephalic and atraumatic.  Eyes:     General: No scleral icterus.    Conjunctiva/sclera: Conjunctivae normal.  Cardiovascular:     Rate and Rhythm: Normal rate and regular rhythm.     Pulses: Normal pulses.     Heart sounds: Normal heart sounds. No murmur heard. Pulmonary:     Effort: Pulmonary effort is normal. No respiratory distress.     Breath sounds: Normal breath sounds. No wheezing or rhonchi.  Abdominal:     Tenderness: There is abdominal tenderness in the left lower quadrant. There is no left CVA tenderness.  Musculoskeletal:     Cervical back: Neck supple.     Right lower leg: No edema.     Left lower leg: No edema.  Lymphadenopathy:     Cervical: No cervical adenopathy.  Skin:    General: Skin is warm and dry.     Findings: No rash.  Neurological:     Mental Status: He is alert and oriented to person, place, and time. Mental status is at baseline.  Psychiatric:        Mood and Affect: Mood normal.        Behavior:  Behavior normal.     No results found for any visits on 08/10/22.  Assessment & Plan     1. Left lower quadrant abdominal pain Could be due to diverticulitis , hernia , fecal impaction? - CBC with Differential/Platelet - Comprehensive metabolic panel - C-reactive protein - Microalbumin, urine - CT ABDOMEN PELVIS W CONTRAST; Future  2. Lower abdominal pain  - POCT urinalysis dipstick positive for glucose, negative for infection? - CT ABDOMEN PELVIS W CONTRAST; Future  FU PRN    The patient was advised to call back or seek an in-person evaluation if the symptoms worsen or if the condition fails to improve as anticipated.  I discussed the assessment and treatment plan with the patient. The patient was provided an opportunity to ask questions and all  were answered. The patient agreed with the plan and demonstrated an understanding of the instructions.  The entirety of the information documented in the History of Present Illness, Review of Systems and Physical Exam were personally obtained by me. Portions of this information were initially documented by the CMA and reviewed by me for thoroughness and accuracy.  Portions of this note were created using dictation software and may contain typographical errors.       Total encounter time more than 40 minutes  Greater than 50% was spent in counseling and coordination of care with the patient    Elberta Leatherwood  [facility] [phone] (phone) [phone] (fax)  [place]

## 2022-08-10 NOTE — Telephone Encounter (Signed)
  Chief Complaint: Abdominal pain Symptoms: left sided abdominal pain 4/10 constant, worse over weekend, area tender with palpation.  Frequency: Friday Pertinent Negatives: Patient denies fever, N/V/D Disposition: [] ED /[] Urgent Care (no appt availability in office) / [x] Appointment(In office/virtual)/ []  [facility]/ [] Home Care/ [] Refused Recommended Disposition /[] [facility]/ []  Follow-up with PCP Additional Notes: Care advise provided, pt verbalizes understanding.  Reason for Disposition  [1] MILD-MODERATE pain AND [2] constant AND [3] present > 2 hours  Answer Assessment - Initial Assessment Questions 1. LOCATION: "Where does it hurt?"      Left sided,little towards front 2. RADIATION: "Does the pain shoot anywhere else?" (e.g., chest, back)     No 3. ONSET: "When did the pain begin?" (Minutes, hours or days ago)      Last Friday, off and on throughout weekend. None Sunday 4. SUDDEN: "Gradual or sudden onset?"     Gradual 5. PATTERN "Does the pain come and go, or is it constant?"    - If it comes and goes: "How long does it last?" "Do you have pain now?"     (Note: Comes and goes means the pain is intermittent. It goes away completely between bouts.)    - If constant: "Is it getting better, staying the same, or getting worse?"      (Note: Constant means the pain never goes away completely; most serious pain is constant and gets worse.)      Constant 6. SEVERITY: "How bad is the pain?"  (e.g., Scale 1-10; mild, moderate, or severe)    - MILD (1-3): Doesn't interfere with normal activities, abdomen soft and not tender to touch.     - MODERATE (4-7): Interferes with normal activities or awakens from sleep, abdomen tender to touch.     - SEVERE (8-10): Excruciating pain, doubled over, unable to do any normal activities.       4/10 7. RECURRENT SYMPTOM: "Have you ever had this type of stomach pain before?" If Yes, ask: "When was the last time?" and "What happened  that time?"      no 8. CAUSE: "What do you think is causing the stomach pain?"     Unsure 9. RELIEVING/AGGRAVATING FACTORS: "What makes it better or worse?" (e.g., antacids, bending or twisting motion, bowel movement)     3 Advils over w/e 30/10 10. OTHER SYMPTOMS: "Do you have any other symptoms?" (e.g., back pain, diarrhea, fever, urination pain, vomiting)       No  Protocols used: Abdominal Pain - Male-A-AH

## 2022-08-11 LAB — COMPREHENSIVE METABOLIC PANEL
ALT: 12 IU/L (ref 0–44)
AST: 10 IU/L (ref 0–40)
Albumin/Globulin Ratio: 2.1 (ref 1.2–2.2)
Albumin: 4 g/dL (ref 3.8–4.8)
Alkaline Phosphatase: 97 IU/L (ref 44–121)
BUN/Creatinine Ratio: 16 (ref 10–24)
BUN: 14 mg/dL (ref 8–27)
Bilirubin Total: 0.4 mg/dL (ref 0.0–1.2)
CO2: 21 mmol/L (ref 20–29)
Calcium: 10.7 mg/dL — ABNORMAL HIGH (ref 8.6–10.2)
Chloride: 110 mmol/L — ABNORMAL HIGH (ref 96–106)
Creatinine, Ser: 0.86 mg/dL (ref 0.76–1.27)
Globulin, Total: 1.9 g/dL (ref 1.5–4.5)
Glucose: 103 mg/dL — ABNORMAL HIGH (ref 70–99)
Potassium: 4.4 mmol/L (ref 3.5–5.2)
Sodium: 143 mmol/L (ref 134–144)
Total Protein: 5.9 g/dL — ABNORMAL LOW (ref 6.0–8.5)
eGFR: 88 mL/min/{1.73_m2} (ref >59)

## 2022-08-11 LAB — CBC WITH DIFFERENTIAL/PLATELET
Basophils Absolute: 0 10*3/uL (ref 0.0–0.2)
Basos: 1 %
EOS (ABSOLUTE): 0.1 10*3/uL (ref 0.0–0.4)
Eos: 2 %
Hematocrit: 41 % (ref 37.5–51.0)
Hemoglobin: 13.4 g/dL (ref 13.0–17.7)
Immature Grans (Abs): 0 10*3/uL (ref 0.0–0.1)
Immature Granulocytes: 0 %
Lymphocytes Absolute: 1 10*3/uL (ref 0.7–3.1)
Lymphs: 23 %
MCH: 30.7 pg (ref 26.6–33.0)
MCHC: 32.7 g/dL (ref 31.5–35.7)
MCV: 94 fL (ref 79–97)
Monocytes Absolute: 0.4 10*3/uL (ref 0.1–0.9)
Monocytes: 9 %
Neutrophils Absolute: 2.8 10*3/uL (ref 1.4–7.0)
Neutrophils: 65 %
Platelets: 187 10*3/uL (ref 150–450)
RBC: 4.37 x10E6/uL (ref 4.14–5.80)
RDW: 14.1 % (ref 11.6–15.4)
WBC: 4.3 10*3/uL (ref 3.4–10.8)

## 2022-08-11 LAB — MICROALBUMIN, URINE: Microalbumin, Urine: 8.9 ug/mL

## 2022-08-11 LAB — C-REACTIVE PROTEIN: CRP: 1 mg/L (ref 0–10)

## 2022-08-11 LAB — SPECIMEN STATUS REPORT

## 2022-08-12 ENCOUNTER — Ambulatory Visit
Admission: RE | Admit: 2022-08-12 | Discharge: 2022-08-12 | Disposition: A | Discharge status: Home / Self Care | Payer: Medicare Other | Source: Ambulatory Visit | Attending: Physician Assistant | Admitting: Physician Assistant

## 2022-08-12 ENCOUNTER — Other Ambulatory Visit: Payer: Self-pay | Admitting: Family Medicine

## 2022-08-12 DIAGNOSIS — N200 Calculus of kidney: Secondary | ICD-10-CM | POA: Diagnosis not present

## 2022-08-12 DIAGNOSIS — N281 Cyst of kidney, acquired: Secondary | ICD-10-CM | POA: Diagnosis not present

## 2022-08-12 DIAGNOSIS — R1030 Lower abdominal pain, unspecified: Secondary | ICD-10-CM | POA: Diagnosis not present

## 2022-08-12 DIAGNOSIS — R1032 Left lower quadrant pain: Secondary | ICD-10-CM | POA: Diagnosis not present

## 2022-08-12 DIAGNOSIS — E039 Hypothyroidism, unspecified: Secondary | ICD-10-CM

## 2022-08-12 MED ORDER — IOHEXOL 300 MG/ML  SOLN
100.0000 mL | Freq: Once | INTRAMUSCULAR | Status: AC | PRN
  Administered 2022-08-12: 100 mL via INTRAVENOUS

## 2022-08-12 NOTE — Telephone Encounter (Signed)
Requested Prescriptions  Pending Prescriptions Disp Refills  . levothyroxine (SYNTHROID) 88 MCG tablet [Pharmacy Med Name: LEVOTHYROXINE SODIUM 88 MCG Tablet] 90 tablet 0    Sig: TAKE 1 TABLET EVERY DAY BEFORE BREAKFAST     Endocrinology:  Hypothyroid Agents Passed - 08/12/2022 11:22 AM      Passed - TSH in normal range and within 360 days    TSH  Date Value Ref Range Status  01/24/2022 2.990 0.450 - 4.500 uIU/mL Final         Passed - Valid encounter within last 12 months    Recent Outpatient Visits          2 days ago Left lower quadrant abdominal pain   [facility] [place], [place], PA-C   1 month ago Type 2 diabetes mellitus with diabetic neuropathy, without long-term current use of insulin [facility])   [facility] Jerrol Banana., MD   6 months ago Essential hypertension   [facility] Jerrol Banana., MD   1 year ago Primary hypertension   [facility] Jerrol Banana., MD   1 year ago COVID-19   [facility], Dionne Bucy, MD

## 2022-08-16 ENCOUNTER — Other Ambulatory Visit: Payer: Self-pay | Admitting: Physician Assistant

## 2022-08-16 DIAGNOSIS — N200 Calculus of kidney: Secondary | ICD-10-CM

## 2022-08-16 DIAGNOSIS — D1771 Benign lipomatous neoplasm of kidney: Secondary | ICD-10-CM

## 2022-08-16 DIAGNOSIS — R1032 Left lower quadrant pain: Secondary | ICD-10-CM

## 2022-08-16 DIAGNOSIS — N281 Cyst of kidney, acquired: Secondary | ICD-10-CM

## 2022-08-16 DIAGNOSIS — K4090 Unilateral inguinal hernia, without obstruction or gangrene, not specified as recurrent: Secondary | ICD-10-CM

## 2022-08-16 DIAGNOSIS — D1779 Benign lipomatous neoplasm of other sites: Secondary | ICD-10-CM

## 2022-08-16 NOTE — Progress Notes (Signed)
Referrals for pt per post abnormal CT findings were placed

## 2022-08-22 NOTE — H&P (View-Only) (Signed)
Patient ID: Zachary Martinez, male   DOB: 1940/11/18, 81 y.o.   MRN: [id number]  Chief Complaint: Left lower quadrant pain  History of Present Illness [name] is a 81 y.o. male with a conflicting history of diverticulitis versus left groin hernia.  Recent CT scanning has confirmed a large left inguinal hernia with sigmoid involvement.  Although some stranding was noted which could be related to diverticulitis, I suspect the hernia is more so to blame with any kind of inflammatory stranding there.  He denies fevers and chills.  He is not really appreciated too much in way of his left inguinal hernia. He has no history of right flank pain, or known hematuria.  He does have some difficulty initiating his urinary stream, he does have some likely retention with incontinence.  And there are some other findings on his CT, and is pending a urology evaluation by Dr. Bernardo Heater in the near future.   Past Medical History Past Medical History:  Diagnosis Date   Arthritis    knees    Bilateral carotid artery disease (HCC)    CAD (coronary artery disease) 02/13/2013   3v CAD; no PCI   Cataract    removed right eye. left eye forming    Diverticulosis    Hyperlipidemia    Hypertension    Hypothyroidism    LVH (left ventricular hypertrophy)    Neuropathy    OSA on CPAP    T2DM (type 2 diabetes mellitus) ([place])    Valvular insufficiency    a.) moderate MR and TR   Venous insufficiency of both lower extremities       Past Surgical History:  Procedure Laterality Date   CARDIAC CATHETERIZATION N/A 02/13/2013   Moderate 3v CAD; no PCI; Location: [facility]; Surgeon: Serafina Royals, MD   CATARACT EXTRACTION W/PHACO Right 11/01/2016   Procedure: CATARACT EXTRACTION PHACO AND INTRAOCULAR LENS PLACEMENT ([place]);  Surgeon: Birder Robson, MD;  Location: [facility];  Service: Ophthalmology;  Laterality: Right;  Lot #[id number] H [place]: oo:35.4 AP%; 22.4 CDE: 7.91   COLONOSCOPY     CYST REMOVAL NECK     EYE SURGERY      to remove blood from busted blood vessel   HARDWARE REMOVAL Left 04/01/2021   Procedure: Screw removal from left proximal tibia;  Surgeon: Hessie Knows, MD;  Location: [facility];  Service: Orthopedics;  Laterality: Left;   KNEE ARTHROSCOPY Right 2003, 2005   x2   KNEE ARTHROSCOPY Left 2013   nasal polyps  1978   POLYPECTOMY     TIBIA FRACTURE SURGERY Left 2012   TONSILLECTOMY AND ADENOIDECTOMY     TOTAL KNEE ARTHROPLASTY Left 06/08/2021   Procedure: TOTAL KNEE ARTHROPLASTY;  Surgeon: Hessie Knows, MD;  Location: [facility];  Service: Orthopedics;  Laterality: Left;   UVULOPLASTY N/A 2000    Allergies  Allergen Reactions   Ace Inhibitors Anaphylaxis and Other (See Comments)    Current Outpatient Medications  Medication Sig Dispense Refill   amLODipine (NORVASC) 10 MG tablet TAKE 1 TABLET EVERY DAY 90 tablet 1   atorvastatin (LIPITOR) 40 MG tablet TAKE 1 TABLET EVERY DAY 90 tablet 3   carvedilol (COREG) 6.25 MG tablet Take 1 tablet (6.25 mg total) by mouth 2 (two) times daily with a meal. 180 tablet 3   EPINEPHrine 0.3 mg/0.3 mL IJ SOAJ injection Inject 0.3 mg into the muscle as needed for anaphylaxis.     hydrALAZINE (APRESOLINE) 100 MG tablet TAKE 1 TABLET TWICE DAILY 180 tablet 1  INVOKAMET 513 350 4758 MG TABS TAKE 1 TABLET TWICE DAILY 180 tablet 3   levothyroxine (SYNTHROID) 88 MCG tablet TAKE 1 TABLET EVERY DAY BEFORE BREAKFAST 90 tablet 0   pioglitazone (ACTOS) 30 MG tablet Take 1 tablet (30 mg total) by mouth every morning. 90 tablet 3   No current facility-administered medications for this visit.    Family History Family History  Problem Relation Age of Onset   Colon cancer Brother 45   Colon cancer Maternal Aunt 60   Colon cancer Maternal Aunt 59   Kidney disease Mother    Anemia Mother    Diabetes Brother    Hyperlipidemia Brother    Hypertension Brother    Colon polyps Brother    Esophageal cancer Neg Hx    Rectal cancer Neg Hx    Stomach cancer Neg Hx        Social History Social History   Tobacco Use   Smoking status: Former    Types: Cigarettes    Quit date: 07/24/1979    Years since quitting: 43.1   Smokeless tobacco: Former    Quit date: 11/01/1979  Vaping Use   Vaping Use: Never used  Substance Use Topics   Alcohol use: Not Currently    Comment: wine 2-3xs a year   Drug use: No        Review of Systems  Constitutional: Negative.   HENT:  Positive for tinnitus.   Eyes:  Negative for blurred vision (Right eye), double vision and pain.  Respiratory: Negative.    Cardiovascular:  Positive for leg swelling.  Gastrointestinal:  Positive for abdominal pain and diarrhea.  Genitourinary:  Positive for dysuria and urgency. Negative for flank pain and hematuria.  Skin: Negative.   Neurological: Negative.   Endo/Heme/Allergies:  Bruises/bleeds easily.  Psychiatric/Behavioral: Negative.        Physical Exam Blood pressure (!) 189/65, pulse (!) 58, temperature 98.1 F (36.7 C), temperature source Oral, height 5\' 9"  (1.753 m), weight 241 lb (109.3 kg), SpO2 97 %. Last Weight  Most recent update: 08/23/2022  1:13 PM    Weight  109.3 kg (241 lb)             CONSTITUTIONAL: Well developed, and nourished, appropriately responsive and aware without distress.   EYES: Sclera non-icteric.   EARS, NOSE, MOUTH AND THROAT:  The oropharynx is clear. Oral mucosa is pink and moist.   Hearing is intact to voice.  NECK: Trachea is midline, and there is no jugular venous distension noted.  LYMPH NODES:  Lymph nodes in the neck are not appreciated. RESPIRATORY:  Normal respiratory effort without pathologic use of accessory muscles. CARDIOVASCULAR: Heart is regular in rate and rhythm. GI: The abdomen is  soft, nontender, and nondistended.  There may be a tiny umbilical defect.  There were no palpable masses. I did not appreciate hepatosplenomegaly. There were normal bowel sounds. GU: Readily palpable left inguinal hernia, reduced on  examination today.  No mass or tenderness in the right groin. MUSCULOSKELETAL:  Symmetrical muscle tone appreciated in all four extremities.    SKIN: Skin turgor is normal. No pathologic skin lesions appreciated.  NEUROLOGIC:  Motor and sensation appear grossly normal.  Cranial nerves are grossly without defect. PSYCH:  Alert and oriented to person, place and time. Affect is appropriate for situation.  Data Reviewed I have personally reviewed what is currently available of the patient's imaging, recent labs and medical records.   Labs:     Latest Ref Rng &  Units 08/10/2022    1:33 PM 01/24/2022    9:19 AM 06/09/2021    4:39 AM  CBC  WBC 3.4 - 10.8 x10E3/uL 4.3  5.3  5.5   Hemoglobin 13.0 - 17.7 g/dL 13.4  14.2  11.6   Hematocrit 37.5 - 51.0 % 41.0  42.8  35.3   Platelets 150 - 450 x10E3/uL 187  170  138       Latest Ref Rng & Units 08/10/2022    1:33 PM 01/24/2022    9:19 AM 06/09/2021    4:39 AM  CMP  Glucose 70 - 99 mg/dL 103  147  177   BUN 8 - 27 mg/dL 14  12  19    Creatinine 0.76 - 1.27 mg/dL 0.86  0.71  0.81   Sodium 134 - 144 mmol/L 143  140  136   Potassium 3.5 - 5.2 mmol/L 4.4  4.6  3.7   Chloride 96 - 106 mmol/L 110  107  104   CO2 20 - 29 mmol/L 21  22  27    Calcium 8.6 - 10.2 mg/dL 10.7  10.8  9.2   Total Protein 6.0 - 8.5 g/dL 5.9  6.1    Total Bilirubin 0.0 - 1.2 mg/dL 0.4  0.5    Alkaline Phos 44 - 121 IU/L 97  96    AST 0 - 40 IU/L 10  14    ALT 0 - 44 IU/L 12  19      Imaging: Radiology review:  CLINICAL DATA:  Pain left lower quadrant of abdomen   EXAM: CT ABDOMEN AND PELVIS WITH CONTRAST   TECHNIQUE: Multidetector CT imaging of the abdomen and pelvis was performed using the standard protocol following bolus administration of intravenous contrast.   RADIATION DOSE REDUCTION: This exam was performed according to the departmental dose-optimization program which includes automated exposure control, adjustment of the mA and/or kV according to patient  size and/or use of iterative reconstruction technique.   CONTRAST:  165mL OMNIPAQUE IOHEXOL 300 MG/ML  SOLN   COMPARISON:  04/06/2011   FINDINGS: Lower chest: There is slight prominence of interstitial markings in the lower lung fields, more so on the left side, possibly scarring. There is interval improvement in the aeration of lower lung fields. Heart is enlarged in size. Coronary artery calcifications are seen. Small pericardial effusion is present.   Hepatobiliary: There is fatty infiltration in liver. There is no dilation of bile ducts. Gallbladder is unremarkable.   Pancreas: There is fatty infiltration. No focal abnormalities are seen.   Spleen: Spleen measures 15.3 cm in maximum diameter.   Adrenals/Urinary Tract: Left adrenal is unremarkable. There is 5.7 cm fatty lesion in the right adrenal suggesting myelolipoma. The lesion measured 3.4 cm in the previous study. Left adrenal is unremarkable. There is no hydronephrosis. There is 4 mm calculus in the midportion of right kidney. There are multiple fluid density lesions in both kidneys largest measuring 4.9 cm in the upper pole of left kidney consistent with bilateral renal cysts. Some of the lesions are seen in parapelvic region. There is interval increase in size and number of bilateral renal cysts. There is 8 mm fat density structure in the mid to lower portion of right kidney suggesting possible angiomyolipoma. Urinary bladder is unremarkable.   Stomach/Bowel: Small hiatal hernia is seen. Stomach is not distended. Small bowel loops are not dilated. Appendix is not dilated. Scattered diverticula seen in colon. There is minimal stranding in the fat planes adjacent to  the distal course of descending colon. There is no loculated pericolic fluid collection. Portion of sigmoid colon is noted lying within left inguinal hernia without signs of obstruction.   Vascular/Lymphatic: Atherosclerotic plaques and calcifications  are seen in aorta and its major branches.   Reproductive: There is inhomogeneous attenuation in prostate.   Other: There is no ascites or pneumoperitoneum. There is ventral hernia in midline slightly above the level of umbilicus. There is small umbilical hernia containing fat. Left inguinal hernia containing fat and portion of sigmoid colon is seen.   Musculoskeletal: A defect in right pars interarticularis of L5 vertebra. There is first-degree spondylolisthesis at L5-S1 level. Spinal stenosis and encroachment of neural foramina is seen at multiple levels in lower lumbar spine.   IMPRESSION: Diverticulosis of colon. There is mild stranding in the fat planes adjacent to the distal course of descending colon at the level of left iliac crest which may suggest residual changes from chronic diverticulitis or mild acute diverticulitis. There is no evidence of any pericolic abscess.   There is large left inguinal hernia containing portion of sigmoid colon and fat without signs of obstruction or incarceration.   There is no evidence of intestinal obstruction or pneumoperitoneum. There is no hydronephrosis. Appendix is not dilated.   Fatty liver. Enlarged spleen. Small hiatal hernia. There is 5.7 cm myelolipoma in right adrenal with interval increase in size. There is 4 mm right renal calculus. Bilateral cortical and parapelvic cysts in the kidneys. Small angiomyolipoma is seen in right kidney.   Small pericardial effusion. Coronary artery calcifications. Spondylolisthesis at L5-S1 level. Lumbar spondylosis.   Other findings as described in the body of the report.     Electronically Signed   By: Elmer Picker M.D.   On: 08/15/2022 08:34 Within last 24 hrs: No results found.  Assessment    Left inguinal hernia with secondary sigmoid involvement.  Reducible. Patient Active Problem List   Diagnosis Date Noted   S/P TKR (total knee replacement) using cement, left 06/08/2021    Bilateral carotid artery disease ([place]) 01/28/2021   Bradycardia 10/30/2018   Venous insufficiency of both lower extremities 06/02/2017   Hypertension 06/07/2016   Type 2 diabetes mellitus ([place]) 10/08/2015   Abnormal chest x-ray 05/09/2015   CAD in native artery 05/09/2015   Benign fibroma of prostate 05/09/2015   Arteriosclerosis of coronary artery 05/09/2015   Diabetes mellitus with polyneuropathy ([place]) 05/09/2015   Diverticulosis of colon 05/09/2015   ED (erectile dysfunction) of organic origin 05/09/2015   Family history of colon cancer 05/09/2015   HLD (hyperlipidemia) 05/09/2015   Adult hypothyroidism 05/09/2015   Adiposity 05/09/2015   Arthritis, degenerative 05/09/2015   Parathyroid adenoma 05/09/2015   Apnea, sleep 05/09/2015   Disorder of bursae and tendons in shoulder region 12/22/2014   Hypertensive left ventricular hypertrophy 11/24/2014   MI (mitral incompetence) 11/24/2014   Obstructive apnea 11/24/2014    Plan    We discussed left inguinal hernia repair. Robotic assisted left inguinal hernia repair with mesh.  I discussed possibility of incarceration, strangulation, enlargement in size over time, and the need for emergency surgery in the face of these.  Also reviewed the techniques of reduction should incarceration occur, and when unsuccessful to present to the ED.  Also discussed that surgery risks include recurrence which can be up to 30% in the case of complex hernias, use of prosthetic materials (mesh) and the increased risk of infection and the possible need for re-operation and removal of mesh, possibility  of post-op SBO or ileus, and the risks of general anesthetic including heart attack, stroke, sudden death or some reaction to anesthetic medications. The patient, and those present, appear to understand the risks, any and all questions were answered to the patient's satisfaction.  No guarantees were ever expressed or implied.   Face-to-face time spent with  the patient and accompanying care providers(if present) was 35 minutes, with more than 50% of the time spent counseling, educating, and coordinating care of the patient.    These notes generated with voice recognition software. I apologize for typographical errors.  Ronny Bacon M.D., FACS 08/23/2022, 1:53 PM

## 2022-08-22 NOTE — Progress Notes (Signed)
Patient ID: Zachary Martinez, male   DOB: 25-Feb-1941, 81 y.o.   MRN: [id number]  Chief Complaint: Left lower quadrant pain  History of Present Illness Zachary Martinez is a 81 y.o. male with a conflicting history of diverticulitis versus left groin hernia.  Recent CT scanning has confirmed a large left inguinal hernia with sigmoid involvement.  Although some stranding was noted which could be related to diverticulitis, I suspect the hernia is more so to blame with any kind of inflammatory stranding there.  He denies fevers and chills.  He is not really appreciated too much in way of his left inguinal hernia. He has no history of right flank pain, or known hematuria.  He does have some difficulty initiating his urinary stream, he does have some likely retention with incontinence.  And there are some other findings on his CT, and is pending a urology evaluation by Dr. Bernardo Heater in the near future.   Past Medical History Past Medical History:  Diagnosis Date   Arthritis    knees    Bilateral carotid artery disease (HCC)    CAD (coronary artery disease) 02/13/2013   3v CAD; no PCI   Cataract    removed right eye. left eye forming    Diverticulosis    Hyperlipidemia    Hypertension    Hypothyroidism    LVH (left ventricular hypertrophy)    Neuropathy    OSA on CPAP    T2DM (type 2 diabetes mellitus) ([place])    Valvular insufficiency    a.) moderate MR and TR   Venous insufficiency of both lower extremities       Past Surgical History:  Procedure Laterality Date   CARDIAC CATHETERIZATION N/A 02/13/2013   Moderate 3v CAD; no PCI; Location: [facility]; Surgeon: Serafina Royals, MD   CATARACT EXTRACTION W/PHACO Right 11/01/2016   Procedure: CATARACT EXTRACTION PHACO AND INTRAOCULAR LENS PLACEMENT ([place]);  Surgeon: Birder Robson, MD;  Location: [facility];  Service: Ophthalmology;  Laterality: Right;  Lot #[id number] H [place]: oo:35.4 AP%; 22.4 CDE: 7.91   COLONOSCOPY     CYST REMOVAL NECK     EYE SURGERY      to remove blood from busted blood vessel   HARDWARE REMOVAL Left 04/01/2021   Procedure: Screw removal from left proximal tibia;  Surgeon: Hessie Knows, MD;  Location: [facility];  Service: Orthopedics;  Laterality: Left;   KNEE ARTHROSCOPY Right 2003, 2005   x2   KNEE ARTHROSCOPY Left 2013   nasal polyps  1978   POLYPECTOMY     TIBIA FRACTURE SURGERY Left 2012   TONSILLECTOMY AND ADENOIDECTOMY     TOTAL KNEE ARTHROPLASTY Left 06/08/2021   Procedure: TOTAL KNEE ARTHROPLASTY;  Surgeon: Hessie Knows, MD;  Location: [facility];  Service: Orthopedics;  Laterality: Left;   UVULOPLASTY N/A 2000    Allergies  Allergen Reactions   Ace Inhibitors Anaphylaxis and Other (See Comments)    Current Outpatient Medications  Medication Sig Dispense Refill   amLODipine (NORVASC) 10 MG tablet TAKE 1 TABLET EVERY DAY 90 tablet 1   atorvastatin (LIPITOR) 40 MG tablet TAKE 1 TABLET EVERY DAY 90 tablet 3   carvedilol (COREG) 6.25 MG tablet Take 1 tablet (6.25 mg total) by mouth 2 (two) times daily with a meal. 180 tablet 3   EPINEPHrine 0.3 mg/0.3 mL IJ SOAJ injection Inject 0.3 mg into the muscle as needed for anaphylaxis.     hydrALAZINE (APRESOLINE) 100 MG tablet TAKE 1 TABLET TWICE DAILY 180 tablet 1  INVOKAMET [id number] MG TABS TAKE 1 TABLET TWICE DAILY 180 tablet 3   levothyroxine (SYNTHROID) 88 MCG tablet TAKE 1 TABLET EVERY DAY BEFORE BREAKFAST 90 tablet 0   pioglitazone (ACTOS) 30 MG tablet Take 1 tablet (30 mg total) by mouth every morning. 90 tablet 3   No current facility-administered medications for this visit.    Family History Family History  Problem Relation Age of Onset   Colon cancer Brother 10   Colon cancer Maternal Aunt 65   Colon cancer Maternal Aunt 10   Kidney disease Mother    Anemia Mother    Diabetes Brother    Hyperlipidemia Brother    Hypertension Brother    Colon polyps Brother    Esophageal cancer Neg Hx    Rectal cancer Neg Hx    Stomach cancer Neg Hx        Social History Social History   Tobacco Use   Smoking status: Former    Types: Cigarettes    Quit date: 07/24/1979    Years since quitting: 43.1   Smokeless tobacco: Former    Quit date: 11/01/1979  Vaping Use   Vaping Use: Never used  Substance Use Topics   Alcohol use: Not Currently    Comment: wine 2-3xs a year   Drug use: No        Review of Systems  Constitutional: Negative.   HENT:  Positive for tinnitus.   Eyes:  Negative for blurred vision (Right eye), double vision and pain.  Respiratory: Negative.    Cardiovascular:  Positive for leg swelling.  Gastrointestinal:  Positive for abdominal pain and diarrhea.  Genitourinary:  Positive for dysuria and urgency. Negative for flank pain and hematuria.  Skin: Negative.   Neurological: Negative.   Endo/Heme/Allergies:  Bruises/bleeds easily.  Psychiatric/Behavioral: Negative.        Physical Exam Blood pressure (!) 189/65, pulse (!) 58, temperature 98.1 F (36.7 C), temperature source Oral, height 5\' 9"  (1.753 m), weight 241 lb (109.3 kg), SpO2 97 %. Last Weight  Most recent update: 08/23/2022  1:13 PM    Weight  109.3 kg (241 lb)             CONSTITUTIONAL: Well developed, and nourished, appropriately responsive and aware without distress.   EYES: Sclera non-icteric.   EARS, NOSE, MOUTH AND THROAT:  The oropharynx is clear. Oral mucosa is pink and moist.   Hearing is intact to voice.  NECK: Trachea is midline, and there is no jugular venous distension noted.  LYMPH NODES:  Lymph nodes in the neck are not appreciated. RESPIRATORY:  Normal respiratory effort without pathologic use of accessory muscles. CARDIOVASCULAR: Heart is regular in rate and rhythm. GI: The abdomen is  soft, nontender, and nondistended.  There may be a tiny umbilical defect.  There were no palpable masses. I did not appreciate hepatosplenomegaly. There were normal bowel sounds. GU: Readily palpable left inguinal hernia, reduced on  examination today.  No mass or tenderness in the right groin. MUSCULOSKELETAL:  Symmetrical muscle tone appreciated in all four extremities.    SKIN: Skin turgor is normal. No pathologic skin lesions appreciated.  NEUROLOGIC:  Motor and sensation appear grossly normal.  Cranial nerves are grossly without defect. PSYCH:  Alert and oriented to person, place and time. Affect is appropriate for situation.  Data Reviewed I have personally reviewed what is currently available of the patient's imaging, recent labs and medical records.   Labs:     Latest Ref Rng &  Units 08/10/2022    1:33 PM 01/24/2022    9:19 AM 06/09/2021    4:39 AM  CBC  WBC 3.4 - 10.8 x10E3/uL 4.3  5.3  5.5   Hemoglobin 13.0 - 17.7 g/dL 13.4  14.2  11.6   Hematocrit 37.5 - 51.0 % 41.0  42.8  35.3   Platelets 150 - 450 x10E3/uL 187  170  138       Latest Ref Rng & Units 08/10/2022    1:33 PM 01/24/2022    9:19 AM 06/09/2021    4:39 AM  CMP  Glucose 70 - 99 mg/dL 103  147  177   BUN 8 - 27 mg/dL 14  12  19    Creatinine 0.76 - 1.27 mg/dL 0.86  0.71  0.81   Sodium 134 - 144 mmol/L 143  140  136   Potassium 3.5 - 5.2 mmol/L 4.4  4.6  3.7   Chloride 96 - 106 mmol/L 110  107  104   CO2 20 - 29 mmol/L 21  22  27    Calcium 8.6 - 10.2 mg/dL 10.7  10.8  9.2   Total Protein 6.0 - 8.5 g/dL 5.9  6.1    Total Bilirubin 0.0 - 1.2 mg/dL 0.4  0.5    Alkaline Phos 44 - 121 IU/L 97  96    AST 0 - 40 IU/L 10  14    ALT 0 - 44 IU/L 12  19      Imaging: Radiology review:  CLINICAL DATA:  Pain left lower quadrant of abdomen   EXAM: CT ABDOMEN AND PELVIS WITH CONTRAST   TECHNIQUE: Multidetector CT imaging of the abdomen and pelvis was performed using the standard protocol following bolus administration of intravenous contrast.   RADIATION DOSE REDUCTION: This exam was performed according to the departmental dose-optimization program which includes automated exposure control, adjustment of the mA and/or kV according to patient  size and/or use of iterative reconstruction technique.   CONTRAST:  140mL OMNIPAQUE IOHEXOL 300 MG/ML  SOLN   COMPARISON:  04/06/2011   FINDINGS: Lower chest: There is slight prominence of interstitial markings in the lower lung fields, more so on the left side, possibly scarring. There is interval improvement in the aeration of lower lung fields. Heart is enlarged in size. Coronary artery calcifications are seen. Small pericardial effusion is present.   Hepatobiliary: There is fatty infiltration in liver. There is no dilation of bile ducts. Gallbladder is unremarkable.   Pancreas: There is fatty infiltration. No focal abnormalities are seen.   Spleen: Spleen measures 15.3 cm in maximum diameter.   Adrenals/Urinary Tract: Left adrenal is unremarkable. There is 5.7 cm fatty lesion in the right adrenal suggesting myelolipoma. The lesion measured 3.4 cm in the previous study. Left adrenal is unremarkable. There is no hydronephrosis. There is 4 mm calculus in the midportion of right kidney. There are multiple fluid density lesions in both kidneys largest measuring 4.9 cm in the upper pole of left kidney consistent with bilateral renal cysts. Some of the lesions are seen in parapelvic region. There is interval increase in size and number of bilateral renal cysts. There is 8 mm fat density structure in the mid to lower portion of right kidney suggesting possible angiomyolipoma. Urinary bladder is unremarkable.   Stomach/Bowel: Small hiatal hernia is seen. Stomach is not distended. Small bowel loops are not dilated. Appendix is not dilated. Scattered diverticula seen in colon. There is minimal stranding in the fat planes adjacent to  the distal course of descending colon. There is no loculated pericolic fluid collection. Portion of sigmoid colon is noted lying within left inguinal hernia without signs of obstruction.   Vascular/Lymphatic: Atherosclerotic plaques and calcifications  are seen in aorta and its major branches.   Reproductive: There is inhomogeneous attenuation in prostate.   Other: There is no ascites or pneumoperitoneum. There is ventral hernia in midline slightly above the level of umbilicus. There is small umbilical hernia containing fat. Left inguinal hernia containing fat and portion of sigmoid colon is seen.   Musculoskeletal: A defect in right pars interarticularis of L5 vertebra. There is first-degree spondylolisthesis at L5-S1 level. Spinal stenosis and encroachment of neural foramina is seen at multiple levels in lower lumbar spine.   IMPRESSION: Diverticulosis of colon. There is mild stranding in the fat planes adjacent to the distal course of descending colon at the level of left iliac crest which may suggest residual changes from chronic diverticulitis or mild acute diverticulitis. There is no evidence of any pericolic abscess.   There is large left inguinal hernia containing portion of sigmoid colon and fat without signs of obstruction or incarceration.   There is no evidence of intestinal obstruction or pneumoperitoneum. There is no hydronephrosis. Appendix is not dilated.   Fatty liver. Enlarged spleen. Small hiatal hernia. There is 5.7 cm myelolipoma in right adrenal with interval increase in size. There is 4 mm right renal calculus. Bilateral cortical and parapelvic cysts in the kidneys. Small angiomyolipoma is seen in right kidney.   Small pericardial effusion. Coronary artery calcifications. Spondylolisthesis at L5-S1 level. Lumbar spondylosis.   Other findings as described in the body of the report.     Electronically Signed   By: Elmer Picker M.D.   On: 08/15/2022 08:34 Within last 24 hrs: No results found.  Assessment    Left inguinal hernia with secondary sigmoid involvement.  Reducible. Patient Active Problem List   Diagnosis Date Noted   S/P TKR (total knee replacement) using cement, left 06/08/2021    Bilateral carotid artery disease ([place]) 01/28/2021   Bradycardia 10/30/2018   Venous insufficiency of both lower extremities 06/02/2017   Hypertension 06/07/2016   Type 2 diabetes mellitus ([place]) 10/08/2015   Abnormal chest x-ray 05/09/2015   CAD in native artery 05/09/2015   Benign fibroma of prostate 05/09/2015   Arteriosclerosis of coronary artery 05/09/2015   Diabetes mellitus with polyneuropathy ([place]) 05/09/2015   Diverticulosis of colon 05/09/2015   ED (erectile dysfunction) of organic origin 05/09/2015   Family history of colon cancer 05/09/2015   HLD (hyperlipidemia) 05/09/2015   Adult hypothyroidism 05/09/2015   Adiposity 05/09/2015   Arthritis, degenerative 05/09/2015   Parathyroid adenoma 05/09/2015   Apnea, sleep 05/09/2015   Disorder of bursae and tendons in shoulder region 12/22/2014   Hypertensive left ventricular hypertrophy 11/24/2014   MI (mitral incompetence) 11/24/2014   Obstructive apnea 11/24/2014    Plan    We discussed left inguinal hernia repair. Robotic assisted left inguinal hernia repair with mesh.  I discussed possibility of incarceration, strangulation, enlargement in size over time, and the need for emergency surgery in the face of these.  Also reviewed the techniques of reduction should incarceration occur, and when unsuccessful to present to the ED.  Also discussed that surgery risks include recurrence which can be up to 30% in the case of complex hernias, use of prosthetic materials (mesh) and the increased risk of infection and the possible need for re-operation and removal of mesh, possibility  of post-op SBO or ileus, and the risks of general anesthetic including heart attack, stroke, sudden death or some reaction to anesthetic medications. The patient, and those present, appear to understand the risks, any and all questions were answered to the patient's satisfaction.  No guarantees were ever expressed or implied.   Face-to-face time spent with  the patient and accompanying care providers(if present) was 35 minutes, with more than 50% of the time spent counseling, educating, and coordinating care of the patient.    These notes generated with voice recognition software. I apologize for typographical errors.  Ronny Bacon M.D., FACS 08/23/2022, 1:53 PM

## 2022-08-23 ENCOUNTER — Ambulatory Visit (INDEPENDENT_AMBULATORY_CARE_PROVIDER_SITE_OTHER): Payer: Medicare Other | Admitting: Surgery

## 2022-08-23 ENCOUNTER — Encounter: Payer: Self-pay | Admitting: Surgery

## 2022-08-23 ENCOUNTER — Telehealth: Payer: Self-pay

## 2022-08-23 ENCOUNTER — Ambulatory Visit: Payer: Self-pay | Admitting: Surgery

## 2022-08-23 ENCOUNTER — Other Ambulatory Visit: Payer: Self-pay

## 2022-08-23 VITALS — BP 189/65 | HR 58 | Temp 98.1°F | Ht 69.0 in | Wt 241.0 lb

## 2022-08-23 DIAGNOSIS — K4090 Unilateral inguinal hernia, without obstruction or gangrene, not specified as recurrent: Secondary | ICD-10-CM

## 2022-08-23 NOTE — Telephone Encounter (Signed)
Cardiac Clearance faxed to [facility] at this time.

## 2022-08-23 NOTE — Patient Instructions (Addendum)
Our surgery scheduler will call you within 24-48 hours to schedule your surgery. Please have the [facility] surgery sheet available when speaking with her.     Inguinal Hernia, Adult An inguinal hernia develops when fat or the intestines push through a weak spot in a muscle where the leg meets the lower abdomen (groin). This creates a bulge. This kind of hernia could also be: In the scrotum, if you are male. In folds of skin around the vagina, if you are male. There are three types of inguinal hernias: Hernias that can be pushed back into the abdomen (are reducible). This type rarely causes pain. Hernias that are not reducible (are incarcerated). Hernias that are not reducible and lose their blood supply (are strangulated). This type of hernia requires emergency surgery. What are the causes? This condition is caused by having a weak spot in the muscles or tissues in your groin. This develops over time. The hernia may poke through the weak spot when you suddenly strain your lower abdominal muscles, such as when you: Lift a heavy object. Strain to have a bowel movement. Constipation can lead to straining. Cough. What increases the risk? This condition is more likely to develop in: Males. Pregnant females. People who: Are overweight. Work in jobs that require long periods of standing or heavy lifting. Have had an inguinal hernia before. Smoke or have lung disease. These factors can lead to long-term (chronic) coughing. What are the signs or symptoms? Symptoms may depend on the size of the hernia. Often, a small inguinal hernia has no symptoms. Symptoms of a larger hernia may include: A bulge in the groin area. This is easier to see when standing. It might not be visible when lying down. Pain or burning in the groin. This may get worse when lifting, straining, or coughing. A dull ache or a feeling of pressure in the groin. An unusual bulge in the scrotum, in males. Symptoms of a strangulated  inguinal hernia may include: A bulge in your groin that is very painful and tender to the touch. A bulge that turns red or purple. Fever, nausea, and vomiting. Inability to have a bowel movement or to pass gas. How is this diagnosed? This condition is diagnosed based on your symptoms, your medical history, and a physical exam. Your health care provider may feel your groin area and ask you to cough. How is this treated? Treatment depends on the size of your hernia and whether you have symptoms. If you do not have symptoms, your health care provider may have you watch your hernia carefully and have you come in for follow-up visits. If your hernia is large or if you have symptoms, you may need surgery to repair the hernia. Follow these instructions at home: Lifestyle Avoid lifting heavy objects. Avoid standing for long periods of time. Do not use any products that contain nicotine or tobacco. These products include cigarettes, chewing tobacco, and vaping devices, such as e-cigarettes. If you need help quitting, ask your health care provider. Maintain a healthy weight. Preventing constipation You may need to take these actions to prevent or treat constipation: Drink enough fluid to keep your urine pale yellow. Take over-the-counter or prescription medicines. Eat foods that are high in fiber, such as beans, whole grains, and fresh fruits and vegetables. Limit foods that are high in fat and processed sugars, such as fried or sweet foods. General instructions You may try to push the hernia back in place by very gently pressing on it while  lying down. Do not try to force the bulge back in if it will not push in easily. Watch your hernia for any changes in shape, size, or color. Get help right away if you notice any changes. Take over-the-counter and prescription medicines only as told by your health care provider. Keep all follow-up visits. This is important. Contact a health care provider if: You  have a fever or chills. You develop new symptoms. Your symptoms get worse. Get help right away if: You have pain in your groin that suddenly gets worse. You have a bulge in your groin that: Suddenly gets bigger and does not get smaller. Becomes red or purple or painful to the touch. You are a man and you have a sudden pain in your scrotum, or the size of your scrotum suddenly changes. You cannot push the hernia back in place by very gently pressing on it when you are lying down. You have nausea or vomiting that does not go away. You have a fast heartbeat. You cannot have a bowel movement or pass gas. These symptoms may represent a serious problem that is an emergency. Do not wait to see if the symptoms will go away. Get medical help right away. Call your local emergency services (911 in the [place] Summary An inguinal hernia develops when fat or the intestines push through a weak spot in a muscle where your leg meets your lower abdomen (groin). This condition is caused by having a weak spot in muscles or tissues in your groin. Symptoms may depend on the size of the hernia, and they may include pain or swelling in your groin. A small inguinal hernia often has no symptoms. Treatment may not be needed if you do not have symptoms. If you have symptoms or a large hernia, you may need surgery to repair the hernia. Avoid lifting heavy objects. Also, avoid standing for long periods of time. This information is not intended to replace advice given to you by your health care provider. Make sure you discuss any questions you have with your health care provider. Document Revised: 06/16/2020 Document Reviewed: 06/16/2020 Elsevier Patient Education  [place].     Diverticulitis  Diverticulitis is infection or inflammation of small pouches (diverticula) in the colon that form due to a condition called diverticulosis. Diverticula can trap stool (feces) and bacteria, causing infection and  inflammation. Diverticulitis may cause severe stomach pain and diarrhea. It may lead to tissue damage in the colon that causes bleeding or blockage. The diverticula may also burst (rupture) and cause infected stool to enter other areas of the abdomen. What are the causes? This condition is caused by stool becoming trapped in the diverticula, which allows bacteria to grow in the diverticula. This leads to inflammation and infection. What increases the risk? You are more likely to develop this condition if you have diverticulosis. The risk increases if you: Are overweight or obese. Do not get enough exercise. Drink alcohol. Use tobacco products. Eat a diet that has a lot of red meat such as beef, pork, or lamb. Eat a diet that does not include enough fiber. High-fiber foods include fruits, vegetables, beans, nuts, and whole grains. Are over 85 years of age. What are the signs or symptoms? Symptoms of this condition may include: Pain and tenderness in the abdomen. The pain is normally located on the left side of the abdomen, but it may occur in other areas. Fever and chills. Nausea. Vomiting. Cramping. Bloating. Changes in bowel routines. Blood  in your stool. How is this diagnosed? This condition is diagnosed based on: Your medical history. A physical exam. Tests to make sure there is nothing else causing your condition. These tests may include: Blood tests. Urine tests. CT scan of the abdomen. How is this treated? Most cases of this condition are mild and can be treated at home. Treatment may include: Taking over-the-counter pain medicines. Following a clear liquid diet. Taking antibiotic medicines by mouth. Resting. More severe cases may need to be treated at a hospital. Treatment may include: Not eating or drinking. Taking prescription pain medicine. Receiving antibiotic medicines through an IV. Receiving fluids and nutrition through an IV. Surgery. When your condition is  under control, your health care provider may recommend that you have a colonoscopy. This is an exam to look at the entire large intestine. During the exam, a lubricated, bendable tube is inserted into the anus and then passed into the rectum, colon, and other parts of the large intestine. A colonoscopy can show how severe your diverticula are and whether something else may be causing your symptoms. Follow these instructions at home: Medicines Take over-the-counter and prescription medicines only as told by your health care provider. These include fiber supplements, probiotics, and stool softeners. If you were prescribed an antibiotic medicine, take it as told by your health care provider. Do not stop taking the antibiotic even if you start to feel better. Ask your health care provider if the medicine prescribed to you requires you to avoid driving or using machinery. Eating and drinking  Follow a full liquid diet or another diet as directed by your health care provider. After your symptoms improve, your health care provider may tell you to change your diet. He or she may recommend that you eat a diet that contains at least 25 grams (25 g) of fiber daily. Fiber makes it easier to pass stool. Healthy sources of fiber include: Berries. One cup contains 4-8 grams of fiber. Beans or lentils. One-half cup contains 5-8 grams of fiber. Green vegetables. One cup contains 4 grams of fiber. Avoid eating red meat. General instructions Do not use any products that contain nicotine or tobacco, such as cigarettes, e-cigarettes, and chewing tobacco. If you need help quitting, ask your health care provider. Exercise for at least 30 minutes, 3 times each week. You should exercise hard enough to raise your heart rate and break a sweat. Keep all follow-up visits as told by your health care provider. This is important. You may need to have a colonoscopy. Contact a health care provider if: Your pain does not  improve. Your bowel movements do not return to normal. Get help right away if: Your pain gets worse. Your symptoms do not get better with treatment. Your symptoms suddenly get worse. You have a fever. You vomit more than one time. You have stools that are bloody, black, or tarry. Summary Diverticulitis is infection or inflammation of small pouches (diverticula) in the colon that form due to a condition called diverticulosis. Diverticula can trap stool (feces) and bacteria, causing infection and inflammation. You are at higher risk for this condition if you have diverticulosis and you eat a diet that does not include enough fiber. Most cases of this condition are mild and can be treated at home. More severe cases may need to be treated at a hospital. When your condition is under control, your health care provider may recommend that you have an exam called a colonoscopy. This exam can show how severe  your diverticula are and whether something else may be causing your symptoms. Keep all follow-up visits as told by your health care provider. This is important. This information is not intended to replace advice given to you by your health care provider. Make sure you discuss any questions you have with your health care provider. Document Revised: 07/29/2019 Document Reviewed: 07/29/2019 Elsevier Patient Education  [place].

## 2022-08-24 ENCOUNTER — Telehealth: Payer: Self-pay | Admitting: Surgery

## 2022-08-24 NOTE — Telephone Encounter (Signed)
Patient has been advised of Pre-Admission date/time, and Surgery date at [facility].  Surgery Date: 09/07/22 Preadmission Testing Date: 08/29/22 (phone 8a-1p)  Patient has been made aware to call [phone], between 1-3:00pm the day before surgery, to find out what time to arrive for surgery.

## 2022-08-24 NOTE — Progress Notes (Unsigned)
Cardiac Clearance has been received from Dr Nehemiah Massed. The patient is cleared at Low risk for surgery.

## 2022-08-29 ENCOUNTER — Encounter
Admission: RE | Admit: 2022-08-29 | Discharge: 2022-08-29 | Disposition: A | Discharge status: Home / Self Care | Payer: Medicare Other | Source: Ambulatory Visit | Attending: Surgery | Admitting: Surgery

## 2022-08-29 DIAGNOSIS — I779 Disorder of arteries and arterioles, unspecified: Secondary | ICD-10-CM

## 2022-08-29 DIAGNOSIS — Z01810 Encounter for preprocedural cardiovascular examination: Secondary | ICD-10-CM

## 2022-08-29 DIAGNOSIS — Z01818 Encounter for other preprocedural examination: Secondary | ICD-10-CM

## 2022-08-29 DIAGNOSIS — I2510 Atherosclerotic heart disease of native coronary artery without angina pectoris: Secondary | ICD-10-CM

## 2022-08-29 DIAGNOSIS — E1140 Type 2 diabetes mellitus with diabetic neuropathy, unspecified: Secondary | ICD-10-CM

## 2022-08-29 DIAGNOSIS — I10 Essential (primary) hypertension: Secondary | ICD-10-CM

## 2022-08-29 DIAGNOSIS — Z01812 Encounter for preprocedural laboratory examination: Secondary | ICD-10-CM

## 2022-08-29 HISTORY — DX: Bradycardia, unspecified: R00.1

## 2022-08-29 HISTORY — DX: Gastro-esophageal reflux disease without esophagitis: K21.9

## 2022-08-29 HISTORY — DX: Unspecified malignant neoplasm of skin, unspecified: C44.90

## 2022-08-29 NOTE — Patient Instructions (Signed)
Your procedure is scheduled on:09-07-22 Wednesday Report to the Registration Desk on the 1st floor of the [facility].Then proceed to the 2nd floor Surgery Desk To find out your arrival time, please call [phone] between 1PM - 3PM on:09-06-22 Tuesday If your arrival time is 6:00 am, do not arrive prior to that time as the [facility] entrance doors do not open until 6:00 am.  REMEMBER: Instructions that are not followed completely may result in serious medical risk, up to and including death; or upon the discretion of your surgeon and anesthesiologist your surgery may need to be rescheduled.  Do not eat food OR drink any liquids after midnight the night before surgery.  No gum chewing, lozengers or hard candies.  TAKE THESE MEDICATIONS THE MORNING OF SURGERY WITH A SIP OF WATER: -amLODipine (NORVASC)  -carvedilol (COREG)  -hydrALAZINE (APRESOLINE)  -levothyroxine (SYNTHROID)  Stop your INVOKAMET 2 days prior to surgery-Last dose will be on 09-04-22 Sunday  One week prior to surgery: Stop Anti-inflammatories (NSAIDS) such as Advil, Aleve, Ibuprofen, Motrin, Naproxen, Naprosyn and Aspirin based products such as Excedrin, Goodys Powder, BC Powder.You may however, take Tylenol if needed for pain up until the day of surgery.  Stop ANY OVER THE COUNTER supplements/vitamins NOW (08-29-22) until after surgery.  No Alcohol for 24 hours before or after surgery.  No Smoking including e-cigarettes for 24 hours prior to surgery.  No chewable tobacco products for at least 6 hours prior to surgery.  No nicotine patches on the day of surgery.  Do not use any "recreational" drugs for at least a week prior to your surgery.  Please be advised that the combination of cocaine and anesthesia may have negative outcomes, up to and including death. If you test positive for cocaine, your surgery will be cancelled.  On the morning of surgery brush your teeth with toothpaste and water, you may rinse your  mouth with mouthwash if you wish. Do not swallow any toothpaste or mouthwash.  Use CHG Soap as directed on instruction sheet.  Do not wear jewelry, make-up, hairpins, clips or nail polish.  Do not wear lotions, powders, or perfumes.   Do not shave body from the neck down 48 hours prior to surgery just in case you cut yourself which could leave a site for infection.  Also, freshly shaved skin may become irritated if using the CHG soap.  Contact lenses, hearing aids and dentures may not be worn into surgery.  Do not bring valuables to the hospital. [facility] is not responsible for any missing/lost belongings or valuables.   Bring your C-PAP to the hospital with you   Notify your doctor if there is any change in your medical condition (cold, fever, infection).  Wear comfortable clothing (specific to your surgery type) to the hospital.  After surgery, you can help prevent lung complications by doing breathing exercises.  Take deep breaths and cough every 1-2 hours. Your doctor may order a device called an Incentive Spirometer to help you take deep breaths. When coughing or sneezing, hold a pillow firmly against your incision with both hands. This is called "splinting." Doing this helps protect your incision. It also decreases belly discomfort.  If you are being admitted to the hospital overnight, leave your suitcase in the car. After surgery it may be brought to your room.  If you are being discharged the day of surgery, you will not be allowed to drive home. You will need a responsible adult (18 years or older)  to drive you home and stay with you that night.   If you are taking public transportation, you will need to have a responsible adult (18 years or older) with you. Please confirm with your physician that it is acceptable to use public transportation.   Please call the [facility] Dept. at [phone] if you have any questions about these instructions.  Surgery  Visitation Policy:  Patients undergoing a surgery or procedure may have two family members or support persons with them as long as the person is not COVID-19 positive or experiencing its symptoms.

## 2022-08-30 DIAGNOSIS — Z23 Encounter for immunization: Secondary | ICD-10-CM | POA: Diagnosis not present

## 2022-08-31 ENCOUNTER — Encounter: Payer: Self-pay | Admitting: Urgent Care

## 2022-08-31 ENCOUNTER — Encounter
Admission: RE | Admit: 2022-08-31 | Discharge: 2022-08-31 | Disposition: A | Discharge status: Home / Self Care | Payer: Medicare Other | Source: Ambulatory Visit | Attending: Surgery | Admitting: Surgery

## 2022-08-31 ENCOUNTER — Encounter: Payer: Self-pay | Admitting: Urology

## 2022-08-31 ENCOUNTER — Ambulatory Visit (INDEPENDENT_AMBULATORY_CARE_PROVIDER_SITE_OTHER): Payer: Medicare Other | Admitting: Urology

## 2022-08-31 VITALS — BP 118/84 | HR 60 | Ht 69.0 in | Wt 242.0 lb

## 2022-08-31 DIAGNOSIS — N2889 Other specified disorders of kidney and ureter: Secondary | ICD-10-CM

## 2022-08-31 DIAGNOSIS — E1140 Type 2 diabetes mellitus with diabetic neuropathy, unspecified: Secondary | ICD-10-CM | POA: Insufficient documentation

## 2022-08-31 DIAGNOSIS — R350 Frequency of micturition: Secondary | ICD-10-CM | POA: Diagnosis not present

## 2022-08-31 DIAGNOSIS — I493 Ventricular premature depolarization: Secondary | ICD-10-CM | POA: Insufficient documentation

## 2022-08-31 DIAGNOSIS — D1771 Benign lipomatous neoplasm of kidney: Secondary | ICD-10-CM

## 2022-08-31 DIAGNOSIS — I779 Disorder of arteries and arterioles, unspecified: Secondary | ICD-10-CM | POA: Diagnosis not present

## 2022-08-31 DIAGNOSIS — Z01810 Encounter for preprocedural cardiovascular examination: Secondary | ICD-10-CM | POA: Insufficient documentation

## 2022-08-31 DIAGNOSIS — N401 Enlarged prostate with lower urinary tract symptoms: Secondary | ICD-10-CM

## 2022-08-31 DIAGNOSIS — I2510 Atherosclerotic heart disease of native coronary artery without angina pectoris: Secondary | ICD-10-CM | POA: Diagnosis not present

## 2022-08-31 DIAGNOSIS — D1779 Benign lipomatous neoplasm of other sites: Secondary | ICD-10-CM | POA: Diagnosis not present

## 2022-08-31 DIAGNOSIS — N200 Calculus of kidney: Secondary | ICD-10-CM

## 2022-08-31 DIAGNOSIS — R001 Bradycardia, unspecified: Secondary | ICD-10-CM | POA: Diagnosis not present

## 2022-08-31 LAB — MICROSCOPIC EXAMINATION: Bacteria, UA: NONE SEEN

## 2022-08-31 LAB — URINALYSIS, COMPLETE
Bilirubin, UA: NEGATIVE
Ketones, UA: NEGATIVE
Leukocytes,UA: NEGATIVE
Nitrite, UA: NEGATIVE
Protein,UA: NEGATIVE
RBC, UA: NEGATIVE
Specific Gravity, UA: 1.015 (ref 1.005–1.030)
Urobilinogen, Ur: 0.2 mg/dL (ref 0.2–1.0)
pH, UA: 7 (ref 5.0–7.5)

## 2022-08-31 LAB — BLADDER SCAN AMB NON-IMAGING: Scan Result: 0

## 2022-08-31 MED ORDER — SILODOSIN 8 MG PO CAPS: 8.0000 mg | ORAL_CAPSULE | Freq: Every day | ORAL | 0 refills | Status: DC

## 2022-08-31 NOTE — Progress Notes (Signed)
08/31/2022 12:44 PM   Zachary Martinez 11/03/1940 [id number]  Referring provider: Mardene Speak, PA-C [address] [place],   [address]  Chief Complaint  Patient presents with   Nephrolithiasis    HPI: Zachary Martinez is a 81 y.o. male referred for evaluation of findings identified on a recent CT of the abdomen and pelvis.  Saw PCP 08/10/2022 complaining of left lower quadrant abdominal pain CT abdomen/pelvis with contrast was ordered which showed a 4 mm, nonobstructing right renal calculus; bilateral simple renal cysts with left parapelvic cysts; an 8 mm fat density lesion in the inferior right kidney consistent with angiomyolipoma and a 5.7 cm fatty lesion in the right adrenal gland consistent with myelolipoma.  This lesion measured 2.9 cm on a prior scan June 2012 He does have moderate lower urinary tract symptoms including frequency, urgency and a weak urinary stream.  He will occasionally have small-volume urge incontinence when changing from a sitting to standing position. Denies dysuria, gross hematuria Scheduled for robotic assisted laparoscopic hernia repair by Dr. Christian Mate 09/07/2022   PMH: Past Medical History:  Diagnosis Date   Arthritis    knees    Bilateral carotid artery disease (HCC)    Bradycardia    CAD (coronary artery disease) 02/13/2013   3v CAD; no PCI   Cataract    removed right eye. left eye forming    Diverticulosis    GERD (gastroesophageal reflux disease)    rare   Hyperlipidemia    Hypertension    Hypothyroidism    LVH (left ventricular hypertrophy)    Neuropathy    OSA on CPAP    Skin cancer    chest and ears   T2DM (type 2 diabetes mellitus) ([facility])    Valvular insufficiency    a.) moderate MR and TR   Venous insufficiency of both lower extremities     Surgical History: Past Surgical History:  Procedure Laterality Date   CARDIAC CATHETERIZATION N/A 02/13/2013   Moderate 3v CAD; no PCI; Location: [facility]; Surgeon: Serafina Royals, MD   CATARACT EXTRACTION W/PHACO Right 11/01/2016   Procedure: CATARACT EXTRACTION PHACO AND INTRAOCULAR LENS PLACEMENT ([place]);  Surgeon: Birder Robson, MD;  Location: [facility] ORS;  Service: Ophthalmology;  Laterality: Right;  Lot #[id number] H [place]: oo:35.4 AP%; 22.4 CDE: 7.91   COLONOSCOPY     CYST REMOVAL NECK     EYE SURGERY Right    to remove blood from busted blood vessel   HARDWARE REMOVAL Left 04/01/2021   Procedure: Screw removal from left proximal tibia;  Surgeon: Hessie Knows, MD;  Location: [facility] ORS;  Service: Orthopedics;  Laterality: Left;   KNEE ARTHROSCOPY Right 2003, 2005   x2   KNEE ARTHROSCOPY Left 2013   nasal polyps  1978   POLYPECTOMY     TIBIA FRACTURE SURGERY Left 2012   TONSILLECTOMY AND ADENOIDECTOMY     TOTAL KNEE ARTHROPLASTY Left 06/08/2021   Procedure: TOTAL KNEE ARTHROPLASTY;  Surgeon: Hessie Knows, MD;  Location: [facility] ORS;  Service: Orthopedics;  Laterality: Left;   UVULOPLASTY N/A 2000    Home Medications:  Allergies as of 08/31/2022       Reactions   Ace Inhibitors Anaphylaxis, Other (See Comments)   Swelling of tongue        Medication List        Accurate as of August 31, 2022 12:44 PM. If you have any questions, ask your nurse or doctor.          amLODipine  10 MG tablet Commonly known as: NORVASC TAKE 1 TABLET EVERY DAY What changed: when to take this   atorvastatin 40 MG tablet Commonly known as: LIPITOR TAKE 1 TABLET EVERY DAY What changed: when to take this   carvedilol 6.25 MG tablet Commonly known as: COREG Take 1 tablet (6.25 mg total) by mouth 2 (two) times daily with a meal.   EPINEPHrine 0.3 mg/0.3 mL Soaj injection Commonly known as: EPI-PEN Inject 0.3 mg into the muscle as needed for anaphylaxis.   hydrALAZINE 100 MG tablet Commonly known as: APRESOLINE TAKE 1 TABLET TWICE DAILY   Invokamet [phone] MG Tabs Generic drug: Canagliflozin-metFORMIN HCl TAKE 1 TABLET TWICE DAILY   levothyroxine 88  MCG tablet Commonly known as: SYNTHROID TAKE 1 TABLET EVERY DAY BEFORE BREAKFAST What changed: See the new instructions.   pioglitazone 30 MG tablet Commonly known as: ACTOS Take 1 tablet (30 mg total) by mouth every morning.   silodosin 8 MG Caps capsule Commonly known as: RAPAFLO Take 1 capsule (8 mg total) by mouth daily with breakfast. Started by: Abbie Sons, MD        Allergies:  Allergies  Allergen Reactions   Ace Inhibitors Anaphylaxis and Other (See Comments)    Swelling of tongue    Family History: Family History  Problem Relation Age of Onset   Colon cancer Brother 37   Colon cancer Maternal Aunt 31   Colon cancer Maternal Aunt 21   Kidney disease Mother    Anemia Mother    Diabetes Brother    Hyperlipidemia Brother    Hypertension Brother    Colon polyps Brother    Esophageal cancer Neg Hx    Rectal cancer Neg Hx    Stomach cancer Neg Hx     Social History:  reports that he quit smoking about 43 years ago. His smoking use included cigarettes. He has a 20.00 pack-year smoking history. He quit smokeless tobacco use about 42 years ago. He reports current alcohol use. He reports that he does not use drugs.   Physical Exam: BP 118/84   Pulse 60   Ht 5\' 9"  (1.753 m)   Wt 242 lb (109.8 kg)   BMI 35.74 kg/m   Constitutional:  Alert and oriented, No acute distress. HEENT: [place] AT, moist mucus membranes.  Trachea midline, no masses. Cardiovascular: No clubbing, cyanosis, or edema. Respiratory: Normal respiratory effort, no increased work of breathing. GI: Abdomen is soft, nontender, nondistended, no abdominal masses GU: No CVA tenderness Skin: No rashes, bruises or suspicious lesions. Neurologic: Grossly intact, no focal deficits, moving all 4 extremities. Psychiatric: Normal mood and affect.  Laboratory Data:  Urinalysis Dipstick/microscopy negative   Pertinent Imaging: CT images were personally reviewed and interpreted  Assessment & Plan:     1.  BPH with LUTS PVR 0 mL He was interested in an alpha-blocker trial and based on age Rx silodosin 8 mg was sent to pharmacy x30 days  2. Nephrolithiasis 4 mm nonobstructing right renal calculus Recommend observation and follow-up imaging 1 year  3.  Right renal angiomyolipoma We discussed this is a benign lesion which can be at risk for spontaneous bleeding if >4 cm Recommend follow-up noncontrast CT abdomen 1 year  4.  Right adrenal myelolipoma We discussed this typically is a benign lesion however based on size recommend endocrinology evaluation to see if a functional evaluation is recommended.  He requested endocrinology at [facility], [place]  [address] [place], [place] [address] [phone]

## 2022-09-01 ENCOUNTER — Encounter: Payer: Self-pay | Admitting: Surgery

## 2022-09-01 NOTE — Progress Notes (Signed)
Perioperative Services  Pre-Admission/Anesthesia Testing Clinical Review  Date: 09/01/22  Patient Demographics:  Name: Zachary Martinez DOB:   [birth year] MRN:   [id number]  Planned Surgical Procedure(s):    Case: [id number] Date/Time: 09/07/22 1156   Procedure: XI ROBOTIC ASSISTED INGUINAL HERNIA (Left)   Anesthesia type: General   Pre-op diagnosis: left inguinal hernia   Location: [facility] OR ROOM 04 / [place] [facility] FOR ANESTHESIA GROUP   Surgeons: Ronny Bacon, MD   NOTE: Available PAT nursing documentation and vital signs have been reviewed. Clinical nursing staff has updated patient's PMH/PSHx, current medication list, and drug allergies/intolerances to ensure comprehensive history available to assist in medical decision making as it pertains to the aforementioned surgical procedure and anticipated anesthetic course. Extensive review of available clinical information performed. Corn PMH and PSHx updated with any diagnoses/procedures that  may have been inadvertently omitted during his intake with the pre-admission testing department's nursing staff.  Clinical Discussion:  Zachary Martinez is a 81 y.o. male who is submitted for pre-surgical anesthesia review and clearance prior to him undergoing the above procedure. Patient is a Former Smoker (20 pack years; quit 07/1979). Pertinent PMH includes: angina, CAD, valvular heart disease, diastolic dysfunction, bradycardia, lower extremity venous insufficiency, carotid artery disease, HTN, HLD, T2DM, hypothyroidism, GERD (no daily Tx), hiatal hernia, OSAH (requires nocturnal PAP therapy), OA, lumbar spondylolisthesis, lumbar spondylosis, diabetic neuropathy, LEFT inguinal hernia, nephrolithiasis, RIGHT adrenal myelolipoma, RIGHT renal angiomyolipoma.   Patient is followed by cardiology Nehemiah Massed, MD). He was last seen in the cardiology clinic on 07/27/2022; notes reviewed. At the time of his clinic visit, the patient denied any chest pain, shortness of  breath, PND, orthopnea, palpitations, significant peripheral edema, vertiginous symptoms, or presyncope/syncope.  Patient with a PMH significant for cardiovascular diagnoses.  Patient with complaints of progressive anginal symptoms prompting further cardiovascular evaluation. Diagnostic LEFT heart catheterization on 02/13/2013 that revealed moderate three-vessel nonobstructive CAD; 30% distal LM 40% ostial LAD, 50% proximal LAD, 40% D1, 30% proximal LCx, 60% mid LCx-1, 20% mid LCx-2, 60% OM1, 20% proximal RCA, and 70% RPDA . Culprit lesion leading to patient's angina felt to be RPDA lesion, however PCI was deferred opting for medical management.  TTE performed on 06/29/2020 revealed normal left ventricular systolic function with mild LVH; LVEF 55 to 60%.  There was mild mitral, tricuspid, and pulmonary valvular regurgitation.  Left atrium was mildly enlarged.  There was no evidence of a significant transvalvular gradient to suggest stenosis.  Myocardial perfusion imaging study performed on 06/29/2020 revealed an LVEF of 50%.  There was no evidence of stress-induced myocardial ischemia or arrhythmia; no scintigraphic evidence of scar.  Blood pressure mildly elevated at 140/80 mmHg on currently prescribed CCB (amlodipine), beta-blocker (carvedilol), vasodilator (hydralazine), and alpha-blocker (silodosin) therapies.  Patient is on atorvastatin for his HLD diagnosis and further ASCVD prevention.  T2DM well-controlled on currently prescribed regimen; last HgbA1c was 6.5% when checked on 07/05/2022.  Patient does have an OSAH diagnosis and is reported to be compliant with prescribed nocturnal PAP therapy. Functional capacity, as defined by DASI, is documented as being >/= 4 METS.  No changes were made to his medication regimen.  Patient to continue regular visits with outpatient cardiology for ongoing management of his cardiovascular diagnoses.  Patient is scheduled for an elective XI ROBOTIC ASSISTED LEFT  INGUINAL HERNIA REPAIR on 09/07/2022 with Dr. Ronny Bacon.  Given the patient's past medical history significant for cardiovascular diagnoses, presurgical cardiac clearance was sought by the performing surgeon's office  and PAT team.  Per cardiology, "this patient is optimized for surgery and may proceed with the planned procedural course with a LOW risk of significant perioperative cardiovascular complications". In review of his medication reconciliation, the patient is not noted to be taking any type of anticoagulation or antiplatelet therapies that would need to be held during his perioperative course.  Patient denies previous perioperative complications with anesthesia in the past. In review of the available records, it is noted that patient underwent a neuraxial anesthetic course here at [facility] (ASA III) in 05/2021 without documented complications.      08/31/2022   10:17 AM 08/23/2022    1:13 PM 08/10/2022   10:41 AM  Vitals with BMI  Height _0  _1    Weight 242 lbs 241 lbs 246 lbs  BMI 92.33 00.76   Systolic 226 333 545  Diastolic 84 65 54  Pulse 60 58 52    Providers/Specialists:   NOTE: Primary physician provider listed below. Patient may have been seen by APP or partner within same practice.   PROVIDER ROLE / SPECIALTY LAST Katy Apo, MD General Surgery (Surgeon) 08/23/2022  Jerrol Banana., MD Primary Care Provider 07/05/2022  Serafina Royals, MD Cardiology 07/27/2022   Allergies:  Ace inhibitors  Current Home Medications:   No current facility-administered medications for this encounter.    amLODipine (NORVASC) 10 MG tablet   atorvastatin (LIPITOR) 40 MG tablet   carvedilol (COREG) 6.25 MG tablet   EPINEPHrine 0.3 mg/0.3 mL IJ SOAJ injection   hydrALAZINE (APRESOLINE) 100 MG tablet   INVOKAMET [phone] MG TABS   levothyroxine (SYNTHROID) 88 MCG tablet   pioglitazone (ACTOS) 30 MG tablet   silodosin  (RAPAFLO) 8 MG CAPS capsule   History:   Past Medical History:  Diagnosis Date   Angiomyolipoma of right kidney    Arthritis of both knees    Bilateral carotid artery disease (HCC)    Bradycardia    CAD (coronary artery disease) 02/13/2013   a.) LHC 02/13/2013: 30% dLM, 40% oLAD, 50% pLAD, 40% D1, 30% pLCx, 60% mLCx-1, 20% mLCx-2, 60% OM1, 20% pRCA, 70% RPDA -- med mgmt; b.) MPI 06/29/2020: EF 50%, no ishemia/scar.   Cataract    a.) s/p extraction with IOL placement on RIGHT; b.) forming in LEFT eye   Diastolic dysfunction    a.) TTE 07/04/2017: EF 50%, mod LVH, mild MAC, mod BAE, mod RVE, mild PR, mod MR/TR, G1DD.   Diverticulosis    GERD (gastroesophageal reflux disease)    Hepatic steatosis    Hiatal hernia    Hyperlipidemia    Hypertension    Hypothyroidism    Left inguinal hernia    Lumbar spondylosis    Myelolipoma of right adrenal gland 04/05/2020   a.) CT abd 04/06/2011: measures 2.9 cm; b.) CT A/P 08/12/2022: measured 5.7 cm   Nephrolithiasis    Neuropathy    OSA on CPAP    Skin cancer    chest and ears   Splenomegaly    Spondylolisthesis at L5-S1 level    T2DM (type 2 diabetes mellitus) ([place])    Valvular heart disease    a.) TTE 12/01/2014: mod MR/TR, sev PR; b.) TTE 07/04/2017: mild PR, mod MR/TR; c.) TTE 06/29/2020: mild MR/TR/PR   Venous insufficiency of both lower extremities    Past Surgical History:  Procedure Laterality Date   CATARACT EXTRACTION W/PHACO Right 11/01/2016   Procedure: CATARACT EXTRACTION PHACO AND INTRAOCULAR LENS  PLACEMENT (IOC);  Surgeon: Birder Robson, MD;  Location: [facility] ORS;  Service: Ophthalmology;  Laterality: Right;  Lot #[id number] H [place]: oo:35.4 AP%; 22.4 CDE: 7.91   COLONOSCOPY     CYST REMOVAL NECK     EYE SURGERY Right    to remove blood from busted blood vessel   HARDWARE REMOVAL Left 04/01/2021   Procedure: Screw removal from left proximal tibia;  Surgeon: Hessie Knows, MD;  Location: [facility] ORS;  Service: Orthopedics;   Laterality: Left;   KNEE ARTHROSCOPY Right 2003   KNEE ARTHROSCOPY Left 2013   KNEE ARTHROSCOPY Right 2005   LEFT HEART CATH AND CORONARY ANGIOGRAPHY N/A 02/13/2013   Procedure: LEFT HEART CATH AND CORONARY ANGIOGRAPHY; Location: [facility]; Surgeon: Serafina Royals, MD   nasal polyps  1978   POLYPECTOMY     TIBIA FRACTURE SURGERY Left 2012   TONSILLECTOMY AND ADENOIDECTOMY     TOTAL KNEE ARTHROPLASTY Left 06/08/2021   Procedure: TOTAL KNEE ARTHROPLASTY;  Surgeon: Hessie Knows, MD;  Location: [facility] ORS;  Service: Orthopedics;  Laterality: Left;   UVULOPLASTY N/A 2000   Family History  Problem Relation Age of Onset   Colon cancer Brother 17   Colon cancer Maternal Aunt 6   Colon cancer Maternal Aunt 18   Kidney disease Mother    Anemia Mother    Diabetes Brother    Hyperlipidemia Brother    Hypertension Brother    Colon polyps Brother    Esophageal cancer Neg Hx    Rectal cancer Neg Hx    Stomach cancer Neg Hx    Social History   Tobacco Use   Smoking status: Former    Packs/day: 1.00    Years: 20.00    Total pack years: 20.00    Types: Cigarettes    Quit date: 07/24/1979    Years since quitting: 43.1   Smokeless tobacco: Former    Quit date: 11/01/1979  Vaping Use   Vaping Use: Never used  Substance Use Topics   Alcohol use: Yes    Comment: wine 2-3xs a year   Drug use: No    Pertinent Clinical Results:  LABS: Labs reviewed: Acceptable for surgery.  Lab Results  Component Value Date   WBC 4.3 08/10/2022   HGB 13.4 08/10/2022   HCT 41.0 08/10/2022   MCV 94 08/10/2022   PLT 187 08/10/2022   Lab Results  Component Value Date   NA 143 08/10/2022   K 4.4 08/10/2022   CO2 21 08/10/2022   GLUCOSE 103 (H) 08/10/2022   BUN 14 08/10/2022   CREATININE 0.86 08/10/2022   CALCIUM 10.7 (H) 08/10/2022   EGFR 88 08/10/2022   GFRNONAA >60 06/09/2021   Lab Results  Component Value Date   HGBA1C 6.5 (A) 07/05/2022    ECG: Date: 08/31/2022 Time ECG obtained: 1103  AM Rate: 49 bpm Rhythm: Sinus bradycardia Axis (leads I and aVF): Normal Intervals: PR 198 ms. QRS 108 ms. QTc 366 ms. ST segment and T wave changes: No evidence of acute ST segment elevation or depression Comparison: Similar to previous tracing obtained on 03/25/2021   IMAGING / PROCEDURES: CT ABDOMEN PELVIS W CONTRAST performed on 08/12/2022 Diverticulosis of colon. There is mild stranding in the fat planes adjacent to the distal course of descending colon at the level of left iliac crest which may suggest residual changes from chronic diverticulitis or mild acute diverticulitis. There is no evidence of any pericolic abscess. There is large left inguinal hernia containing portion of sigmoid colon and  fat without signs of obstruction or incarceration.There is no evidence of intestinal obstruction or pneumoperitoneum. There is no hydronephrosis. Appendix is not dilated. Fatty liver.  Enlarged spleen.  Small hiatal hernia.  5.7 cm myelolipoma in right adrenal with interval increase in size.  4 mm right renal calculus.  Bilateral cortical and parapelvic cysts in the kidneys.  Small angiomyolipoma is seen in right kidney. Small pericardial effusion.  Coronary artery calcifications.  Spondylolisthesis at L5-S1 level.  Lumbar spondylosis.  MYOCARDIAL PERFUSION IMAGING STUDY (LEXISCAN) performed on 06/29/2020 LVEF 50% Regional wall motion reveals normal myocardial thickening and wall motion No artifacts noted Left ventricular cavity size normal No evidence of stress-induced myocardial ischemia or arrhythmia The overall quality of the study is good  TRANSTHORACIC ECHOCARDIOGRAM performed on 06/29/2020 LVEF 55-60% Normal left ventricular systolic function with mild LVH Normal right ventricular systolic function Mild left atrial enlargement Mild MR, TR, PR No AR No valvular stenosis No evidence of pericardial effusion  LEFT HEART CATHETERIZATION AND CORONARY ANGIOGRAPHY performed on  02/13/2013 Moderate three-vessel CAD 30% stenosis distal LM 40% stenosis ostial LAD 50% stenosis proximal LAD 40% stenosis first diagonal 30% stenosis proximal LCx 60% stenosis mid LCx 20% stenosis mid LCx 60% stenosis OM1 20% stenosis proximal RCA 70% stenosis RPDA Culprit lesion felt to be stenotic RPDA Recommendations: Defer PCI and continue aggressive medical management  Impression and Plan:  RICARD FAULKNER has been referred for pre-anesthesia review and clearance prior to him undergoing the planned anesthetic and procedural courses. Available labs, pertinent testing, and imaging results were personally reviewed by me. This patient has been appropriately cleared by cardiology with an overall LOW risk of significant perioperative cardiovascular complications.  Based on clinical review performed today (09/01/22), barring any significant acute changes in the patient's overall condition, it is anticipated that he will be able to proceed with the planned surgical intervention. Any acute changes in clinical condition may necessitate his procedure being postponed and/or cancelled. Patient will meet with anesthesia team (MD and/or CRNA) on the day of his procedure for preoperative evaluation/assessment. Questions regarding anesthetic course will be fielded at that time.   Pre-surgical instructions were reviewed with the patient during his PAT appointment and questions were fielded by PAT clinical staff. Patient was advised that if any questions or concerns arise prior to his procedure then he should return a call to PAT and/or his surgeon's office to discuss.  Honor Loh, MSN, APRN, FNP-C, CEN [facility]  Peri-operative Services Nurse Practitioner Phone: [phone] Fax: [phone] 09/01/22 3:38 PM  NOTE: This note has been prepared using Dragon dictation software. Despite my best ability to proofread, there is always the potential that unintentional transcriptional  errors may still occur from this process.

## 2022-09-04 ENCOUNTER — Encounter: Payer: Self-pay | Admitting: Urology

## 2022-09-05 DIAGNOSIS — L570 Actinic keratosis: Secondary | ICD-10-CM | POA: Diagnosis not present

## 2022-09-05 DIAGNOSIS — D2271 Melanocytic nevi of right lower limb, including hip: Secondary | ICD-10-CM | POA: Diagnosis not present

## 2022-09-05 DIAGNOSIS — D2262 Melanocytic nevi of left upper limb, including shoulder: Secondary | ICD-10-CM | POA: Diagnosis not present

## 2022-09-05 DIAGNOSIS — D2272 Melanocytic nevi of left lower limb, including hip: Secondary | ICD-10-CM | POA: Diagnosis not present

## 2022-09-05 DIAGNOSIS — D0439 Carcinoma in situ of skin of other parts of face: Secondary | ICD-10-CM | POA: Diagnosis not present

## 2022-09-05 DIAGNOSIS — C44612 Basal cell carcinoma of skin of right upper limb, including shoulder: Secondary | ICD-10-CM | POA: Diagnosis not present

## 2022-09-05 DIAGNOSIS — Z85828 Personal history of other malignant neoplasm of skin: Secondary | ICD-10-CM | POA: Diagnosis not present

## 2022-09-05 DIAGNOSIS — X32XXXA Exposure to sunlight, initial encounter: Secondary | ICD-10-CM | POA: Diagnosis not present

## 2022-09-05 DIAGNOSIS — D485 Neoplasm of uncertain behavior of skin: Secondary | ICD-10-CM | POA: Diagnosis not present

## 2022-09-06 ENCOUNTER — Other Ambulatory Visit: Payer: Self-pay | Admitting: *Deleted

## 2022-09-06 DIAGNOSIS — E1142 Type 2 diabetes mellitus with diabetic polyneuropathy: Secondary | ICD-10-CM

## 2022-09-06 DIAGNOSIS — I10 Essential (primary) hypertension: Secondary | ICD-10-CM

## 2022-09-06 MED ORDER — CEFAZOLIN SODIUM-DEXTROSE 2-4 GM/100ML-% IV SOLN
2.0000 g | INTRAVENOUS | Status: AC
  Administered 2022-09-07: 2 g via INTRAVENOUS

## 2022-09-06 MED ORDER — BUPIVACAINE LIPOSOME 1.3 % IJ SUSP: 20.0000 mL | Freq: Once | INTRAMUSCULAR | Status: DC

## 2022-09-06 MED ORDER — ACETAMINOPHEN 500 MG PO TABS: 1000.0000 mg | ORAL_TABLET | ORAL | Status: AC

## 2022-09-06 MED ORDER — ORAL CARE MOUTH RINSE: 15.0000 mL | Freq: Once | OROMUCOSAL | Status: AC

## 2022-09-06 MED ORDER — CHLORHEXIDINE GLUCONATE 0.12 % MT SOLN: 15.0000 mL | Freq: Once | OROMUCOSAL | Status: AC

## 2022-09-06 MED ORDER — GABAPENTIN 300 MG PO CAPS: 300.0000 mg | ORAL_CAPSULE | ORAL | Status: AC

## 2022-09-06 MED ORDER — FAMOTIDINE 20 MG PO TABS: 20.0000 mg | ORAL_TABLET | Freq: Once | ORAL | Status: AC

## 2022-09-06 MED ORDER — CHLORHEXIDINE GLUCONATE CLOTH 2 % EX PADS: 6.0000 | MEDICATED_PAD | Freq: Once | CUTANEOUS | Status: DC

## 2022-09-06 MED ORDER — SODIUM CHLORIDE 0.9 % IV SOLN: INTRAVENOUS | Status: DC

## 2022-09-06 MED ORDER — CELECOXIB 200 MG PO CAPS: 200.0000 mg | ORAL_CAPSULE | ORAL | Status: AC

## 2022-09-06 MED ORDER — PIOGLITAZONE HCL 30 MG PO TABS: 30.0000 mg | ORAL_TABLET | ORAL | 3 refills | Status: DC

## 2022-09-06 MED ORDER — HYDRALAZINE HCL 100 MG PO TABS: 100.0000 mg | ORAL_TABLET | Freq: Two times a day (BID) | ORAL | 1 refills | Status: AC

## 2022-09-06 MED ORDER — CARVEDILOL 6.25 MG PO TABS: 6.2500 mg | ORAL_TABLET | Freq: Two times a day (BID) | ORAL | 3 refills | Status: AC

## 2022-09-06 MED ORDER — CHLORHEXIDINE GLUCONATE CLOTH 2 % EX PADS
6.0000 | MEDICATED_PAD | Freq: Once | CUTANEOUS | Status: AC
  Administered 2022-09-07: 6 via TOPICAL

## 2022-09-07 ENCOUNTER — Other Ambulatory Visit: Payer: Self-pay

## 2022-09-07 ENCOUNTER — Ambulatory Visit: Payer: Medicare Other | Admitting: Urgent Care

## 2022-09-07 ENCOUNTER — Encounter
Admission: RE | Disposition: A | Discharge status: Home / Self Care | Payer: Self-pay | Source: Ambulatory Visit | Attending: Surgery

## 2022-09-07 ENCOUNTER — Encounter: Payer: Self-pay | Admitting: Surgery

## 2022-09-07 ENCOUNTER — Ambulatory Visit
Admission: RE | Admit: 2022-09-07 | Discharge: 2022-09-07 | Disposition: A | Discharge status: Home / Self Care | Payer: Medicare Other | Source: Ambulatory Visit | Attending: Surgery | Admitting: Surgery

## 2022-09-07 DIAGNOSIS — K219 Gastro-esophageal reflux disease without esophagitis: Secondary | ICD-10-CM | POA: Diagnosis not present

## 2022-09-07 DIAGNOSIS — E785 Hyperlipidemia, unspecified: Secondary | ICD-10-CM | POA: Insufficient documentation

## 2022-09-07 DIAGNOSIS — E1140 Type 2 diabetes mellitus with diabetic neuropathy, unspecified: Secondary | ICD-10-CM | POA: Insufficient documentation

## 2022-09-07 DIAGNOSIS — I872 Venous insufficiency (chronic) (peripheral): Secondary | ICD-10-CM | POA: Insufficient documentation

## 2022-09-07 DIAGNOSIS — K449 Diaphragmatic hernia without obstruction or gangrene: Secondary | ICD-10-CM | POA: Insufficient documentation

## 2022-09-07 DIAGNOSIS — Z7984 Long term (current) use of oral hypoglycemic drugs: Secondary | ICD-10-CM | POA: Diagnosis not present

## 2022-09-07 DIAGNOSIS — I2510 Atherosclerotic heart disease of native coronary artery without angina pectoris: Secondary | ICD-10-CM | POA: Diagnosis not present

## 2022-09-07 DIAGNOSIS — Z833 Family history of diabetes mellitus: Secondary | ICD-10-CM | POA: Diagnosis not present

## 2022-09-07 DIAGNOSIS — Z87891 Personal history of nicotine dependence: Secondary | ICD-10-CM | POA: Diagnosis not present

## 2022-09-07 DIAGNOSIS — G4733 Obstructive sleep apnea (adult) (pediatric): Secondary | ICD-10-CM | POA: Diagnosis not present

## 2022-09-07 DIAGNOSIS — K4090 Unilateral inguinal hernia, without obstruction or gangrene, not specified as recurrent: Secondary | ICD-10-CM | POA: Diagnosis not present

## 2022-09-07 DIAGNOSIS — Z79899 Other long term (current) drug therapy: Secondary | ICD-10-CM | POA: Insufficient documentation

## 2022-09-07 DIAGNOSIS — I10 Essential (primary) hypertension: Secondary | ICD-10-CM | POA: Insufficient documentation

## 2022-09-07 DIAGNOSIS — E039 Hypothyroidism, unspecified: Secondary | ICD-10-CM | POA: Insufficient documentation

## 2022-09-07 DIAGNOSIS — Z8249 Family history of ischemic heart disease and other diseases of the circulatory system: Secondary | ICD-10-CM | POA: Insufficient documentation

## 2022-09-07 DIAGNOSIS — Z8349 Family history of other endocrine, nutritional and metabolic diseases: Secondary | ICD-10-CM | POA: Insufficient documentation

## 2022-09-07 HISTORY — DX: Calculus of kidney: N20.0

## 2022-09-07 HISTORY — DX: Diaphragmatic hernia without obstruction or gangrene: K44.9

## 2022-09-07 HISTORY — DX: Spondylolisthesis, lumbosacral region: M43.17

## 2022-09-07 HISTORY — DX: Endocarditis, valve unspecified: I38

## 2022-09-07 HISTORY — DX: Splenomegaly, not elsewhere classified: R16.1

## 2022-09-07 HISTORY — DX: Bilateral primary osteoarthritis of knee: M17.0

## 2022-09-07 HISTORY — DX: Other ill-defined heart diseases: I51.89

## 2022-09-07 HISTORY — DX: Unilateral inguinal hernia, without obstruction or gangrene, not specified as recurrent: K40.90

## 2022-09-07 HISTORY — DX: Fatty (change of) liver, not elsewhere classified: K76.0

## 2022-09-07 HISTORY — DX: Spondylosis without myelopathy or radiculopathy, lumbar region: M47.816

## 2022-09-07 HISTORY — DX: Benign lipomatous neoplasm of kidney: D17.71

## 2022-09-07 LAB — GLUCOSE, CAPILLARY
Glucose-Capillary: 147 mg/dL — ABNORMAL HIGH (ref 70–99)
Glucose-Capillary: 195 mg/dL — ABNORMAL HIGH (ref 70–99)

## 2022-09-07 SURGERY — HERNIORRHAPHY, INGUINAL, ROBOT-ASSISTED, LAPAROSCOPIC
Anesthesia: Spinal | Site: Groin | Laterality: Left

## 2022-09-07 MED ORDER — FENTANYL CITRATE (PF) 100 MCG/2ML IJ SOLN
INTRAMUSCULAR | Status: AC
  Filled 2022-09-07: qty 2

## 2022-09-07 MED ORDER — OXYCODONE HCL 5 MG PO TABS: 5.0000 mg | ORAL_TABLET | Freq: Once | ORAL | Status: DC | PRN

## 2022-09-07 MED ORDER — ACETAMINOPHEN 500 MG PO TABS
ORAL_TABLET | ORAL | Status: AC
  Administered 2022-09-07: 1000 mg via ORAL
  Filled 2022-09-07: qty 2

## 2022-09-07 MED ORDER — PROPOFOL 10 MG/ML IV BOLUS
INTRAVENOUS | Status: DC | PRN
  Administered 2022-09-07: 150 mg via INTRAVENOUS

## 2022-09-07 MED ORDER — ONDANSETRON HCL 4 MG/2ML IJ SOLN
INTRAMUSCULAR | Status: DC | PRN
  Administered 2022-09-07: 4 mg via INTRAVENOUS

## 2022-09-07 MED ORDER — GLYCOPYRROLATE 0.2 MG/ML IJ SOLN
INTRAMUSCULAR | Status: DC | PRN
  Administered 2022-09-07: .2 mg via INTRAVENOUS

## 2022-09-07 MED ORDER — LIDOCAINE HCL (PF) 2 % IJ SOLN
INTRAMUSCULAR | Status: AC
  Filled 2022-09-07: qty 5

## 2022-09-07 MED ORDER — CELECOXIB 200 MG PO CAPS
ORAL_CAPSULE | ORAL | Status: AC
  Administered 2022-09-07: 200 mg via ORAL
  Filled 2022-09-07: qty 1

## 2022-09-07 MED ORDER — SUGAMMADEX SODIUM 200 MG/2ML IV SOLN
INTRAVENOUS | Status: DC | PRN
  Administered 2022-09-07: 200 mg via INTRAVENOUS

## 2022-09-07 MED ORDER — FENTANYL CITRATE (PF) 100 MCG/2ML IJ SOLN: 25.0000 ug | INTRAMUSCULAR | Status: DC | PRN

## 2022-09-07 MED ORDER — DEXAMETHASONE SODIUM PHOSPHATE 10 MG/ML IJ SOLN
INTRAMUSCULAR | Status: DC | PRN
  Administered 2022-09-07: 10 mg via INTRAVENOUS

## 2022-09-07 MED ORDER — ESMOLOL HCL 100 MG/10ML IV SOLN
INTRAVENOUS | Status: DC | PRN
  Administered 2022-09-07: 10 mg via INTRAVENOUS

## 2022-09-07 MED ORDER — ROCURONIUM BROMIDE 10 MG/ML (PF) SYRINGE
PREFILLED_SYRINGE | INTRAVENOUS | Status: AC
  Filled 2022-09-07: qty 10

## 2022-09-07 MED ORDER — IBUPROFEN 800 MG PO TABS: 800.0000 mg | ORAL_TABLET | Freq: Three times a day (TID) | ORAL | 0 refills | Status: DC | PRN

## 2022-09-07 MED ORDER — GABAPENTIN 300 MG PO CAPS
ORAL_CAPSULE | ORAL | Status: AC
  Administered 2022-09-07: 300 mg via ORAL
  Filled 2022-09-07: qty 1

## 2022-09-07 MED ORDER — OXYCODONE HCL 5 MG/5ML PO SOLN: 5.0000 mg | Freq: Once | ORAL | Status: DC | PRN

## 2022-09-07 MED ORDER — LIDOCAINE HCL (CARDIAC) PF 100 MG/5ML IV SOSY
PREFILLED_SYRINGE | INTRAVENOUS | Status: DC | PRN
  Administered 2022-09-07: 100 mg via INTRAVENOUS

## 2022-09-07 MED ORDER — FAMOTIDINE 20 MG PO TABS
ORAL_TABLET | ORAL | Status: AC
  Administered 2022-09-07: 20 mg via ORAL
  Filled 2022-09-07: qty 1

## 2022-09-07 MED ORDER — CHLORHEXIDINE GLUCONATE 0.12 % MT SOLN
OROMUCOSAL | Status: AC
  Administered 2022-09-07: 15 mL via OROMUCOSAL
  Filled 2022-09-07: qty 15

## 2022-09-07 MED ORDER — ONDANSETRON HCL 4 MG/2ML IJ SOLN
INTRAMUSCULAR | Status: AC
  Filled 2022-09-07: qty 2

## 2022-09-07 MED ORDER — DEXAMETHASONE SODIUM PHOSPHATE 10 MG/ML IJ SOLN
INTRAMUSCULAR | Status: AC
  Filled 2022-09-07: qty 1

## 2022-09-07 MED ORDER — ROCURONIUM BROMIDE 100 MG/10ML IV SOLN
INTRAVENOUS | Status: DC | PRN
  Administered 2022-09-07: 60 mg via INTRAVENOUS

## 2022-09-07 MED ORDER — CEFAZOLIN SODIUM-DEXTROSE 2-4 GM/100ML-% IV SOLN
INTRAVENOUS | Status: AC
  Filled 2022-09-07: qty 100

## 2022-09-07 MED ORDER — 0.9 % SODIUM CHLORIDE (POUR BTL) OPTIME
TOPICAL | Status: DC | PRN
  Administered 2022-09-07: 1000 mL

## 2022-09-07 MED ORDER — PROPOFOL 10 MG/ML IV BOLUS
INTRAVENOUS | Status: AC
  Filled 2022-09-07: qty 20

## 2022-09-07 MED ORDER — LACTATED RINGERS IV SOLN: INTRAVENOUS | Status: DC | PRN

## 2022-09-07 MED ORDER — BUPIVACAINE-EPINEPHRINE (PF) 0.25% -1:200000 IJ SOLN
INTRAMUSCULAR | Status: AC
  Filled 2022-09-07: qty 30

## 2022-09-07 MED ORDER — BUPIVACAINE-EPINEPHRINE (PF) 0.25% -1:200000 IJ SOLN
INTRAMUSCULAR | Status: DC | PRN
  Administered 2022-09-07: 17 mL
  Administered 2022-09-07: 13 mL

## 2022-09-07 MED ORDER — FENTANYL CITRATE (PF) 100 MCG/2ML IJ SOLN
INTRAMUSCULAR | Status: DC | PRN
  Administered 2022-09-07 (×3): 50 ug via INTRAVENOUS

## 2022-09-07 MED ORDER — HYDROCODONE-ACETAMINOPHEN 5-325 MG PO TABS: 1.0000 | ORAL_TABLET | Freq: Four times a day (QID) | ORAL | 0 refills | Status: DC | PRN

## 2022-09-07 SURGICAL SUPPLY — 48 items
ADH SKN CLS APL DERMABOND .7 (GAUZE/BANDAGES/DRESSINGS) ×1
BLADE CLIPPER SURG (BLADE) ×1 IMPLANT
COVER TIP SHEARS 8 DVNC (MISCELLANEOUS) ×1 IMPLANT
COVER TIP SHEARS 8MM DA VINCI (MISCELLANEOUS) ×1
COVER WAND RF STERILE (DRAPES) ×1 IMPLANT
DERMABOND ADVANCED .7 DNX12 (GAUZE/BANDAGES/DRESSINGS) ×1 IMPLANT
DRAPE ARM DVNC X/XI (DISPOSABLE) ×3 IMPLANT
DRAPE COLUMN DVNC XI (DISPOSABLE) ×1 IMPLANT
DRAPE DA VINCI XI ARM (DISPOSABLE) ×3
DRAPE DA VINCI XI COLUMN (DISPOSABLE) ×1
ELECT REM PT RETURN 9FT ADLT (ELECTROSURGICAL) ×1
ELECTRODE REM PT RTRN 9FT ADLT (ELECTROSURGICAL) ×1 IMPLANT
GLOVE ORTHO TXT STRL SZ7.5 (GLOVE) ×3 IMPLANT
GOWN STRL REUS W/ TWL LRG LVL3 (GOWN DISPOSABLE) ×1 IMPLANT
GOWN STRL REUS W/ TWL XL LVL3 (GOWN DISPOSABLE) ×2 IMPLANT
GOWN STRL REUS W/TWL LRG LVL3 (GOWN DISPOSABLE) ×1
GOWN STRL REUS W/TWL XL LVL3 (GOWN DISPOSABLE) ×2
GRASPER SUT TROCAR 14GX15 (MISCELLANEOUS) IMPLANT
IRRIGATION STRYKERFLOW (MISCELLANEOUS) IMPLANT
IRRIGATOR STRYKERFLOW (MISCELLANEOUS)
IV NS 1000ML (IV SOLUTION)
IV NS 1000ML BAXH (IV SOLUTION) IMPLANT
KIT PINK PAD W/HEAD ARE REST (MISCELLANEOUS) ×1
KIT PINK PAD W/HEAD ARM REST (MISCELLANEOUS) ×1 IMPLANT
LABEL OR SOLS (LABEL) ×1 IMPLANT
MANIFOLD NEPTUNE II (INSTRUMENTS) ×1 IMPLANT
MESH 3DMAX LIGHT 4.8X6.7 LT XL (Mesh General) IMPLANT
NDL INSUFFLATION 14GA 120MM (NEEDLE) IMPLANT
NEEDLE HYPO 22GX1.5 SAFETY (NEEDLE) ×1 IMPLANT
NEEDLE INSUFFLATION 14GA 120MM (NEEDLE) IMPLANT
PACK LAP CHOLECYSTECTOMY (MISCELLANEOUS) ×1 IMPLANT
SEAL CANN UNIV 5-8 DVNC XI (MISCELLANEOUS) ×3 IMPLANT
SEAL XI 5MM-8MM UNIVERSAL (MISCELLANEOUS) ×3
SET TUBE SMOKE EVAC HIGH FLOW (TUBING) ×1 IMPLANT
SOLUTION ELECTROLUBE (MISCELLANEOUS) ×1 IMPLANT
SUPPORTER ATHLETIC XL (MISCELLANEOUS) ×1
SUPPORTER ATHLETIC XL 3X44-50X (MISCELLANEOUS) IMPLANT
SUT MNCRL 4-0 (SUTURE) ×1
SUT MNCRL 4-0 27XMFL (SUTURE) ×1
SUT V-LOC 90 ABS 3-0 VLT  V-20 (SUTURE)
SUT V-LOC 90 ABS 3-0 VLT V-20 (SUTURE) IMPLANT
SUT VIC AB 0 CT2 27 (SUTURE) ×1 IMPLANT
SUT VIC AB 2-0 RB1 27 (SUTURE) ×1 IMPLANT
SUT VLOC 90 2/L VL 12 GS22 (SUTURE) IMPLANT
SUT VLOC 90 S/L VL9 GS22 (SUTURE) IMPLANT
SUTURE MNCRL 4-0 27XMF (SUTURE) ×1 IMPLANT
TRAP FLUID SMOKE EVACUATOR (MISCELLANEOUS) ×1 IMPLANT
WATER STERILE IRR 500ML POUR (IV SOLUTION) ×1 IMPLANT

## 2022-09-07 NOTE — Anesthesia Postprocedure Evaluation (Signed)
Anesthesia Post Note  Patient: Zachary Martinez  Procedure(s) Performed: XI ROBOTIC ASSISTED INGUINAL HERNIA (Left: Groin)  Patient location during evaluation: PACU Anesthesia Type: Spinal Level of consciousness: awake and alert Pain management: pain level controlled Vital Signs Assessment: post-procedure vital signs reviewed and stable Respiratory status: spontaneous breathing, nonlabored ventilation, respiratory function stable and patient connected to nasal cannula oxygen Cardiovascular status: blood pressure returned to baseline and stable Postop Assessment: no apparent nausea or vomiting Anesthetic complications: no  No notable events documented.   Last Vitals:  Vitals:   09/07/22 1505 09/07/22 1517  BP:  (!) 167/67  Pulse: 62 62  Resp: (!) 9   Temp:  (!) 36.2 C  SpO2: 91% 92%    Last Pain:  Vitals:   09/07/22 1517  TempSrc: Temporal  PainSc: 0-No pain                 Dimas Millin

## 2022-09-07 NOTE — Discharge Instructions (Signed)

## 2022-09-07 NOTE — Op Note (Addendum)
Robotic assisted Laparoscopic Transabdominal left inguinal Hernia Repair with Mesh       Pre-operative Diagnosis: Left inguinal Hernia   Post-operative Diagnosis: Same   Procedure: Robotic assisted Laparoscopic  repair of left inguinal hernia(s)   Surgeon: Campbell Lerner, M.D., FACS   Anesthesia: GETA   Findings: Left pantaloon inguinal hernia, no evidence of right sided hernia, although there was a degree of attenuation noted in the right groin.         Procedure Details  The patient was seen again in the Holding Room. The benefits, complications, treatment options, and expected outcomes were discussed with the patient. The risks of bleeding, infection, recurrence of symptoms, failure to resolve symptoms, recurrence of hernia, ischemic orchitis, chronic pain syndrome or neuroma, were reviewed again. The likelihood of improving the patient's symptoms with return to their baseline status is good.  The patient and/or family concurred with the proposed plan, giving informed consent.  The patient was taken to Operating Room, identified  and the procedure verified as Laparoscopic Inguinal Hernia Repair. Laterality confirmed.  A Time Out was held and the above information confirmed.   Prior to the induction of general anesthesia, antibiotic prophylaxis was administered. VTE prophylaxis was in place. General endotracheal anesthesia was then administered and tolerated well. After the induction, the abdomen was prepped with Chloraprep and draped in the sterile fashion. The patient was positioned in the supine position.   After local infiltration of quarter percent Marcaine with epinephrine, stab incision was made left upper quadrant.  On the left at Palmer's point, the Veress needle is passed with sensation of the layers to penetrate the abdominal wall and into the peritoneum.  Saline drop test is confirmed peritoneal placement.  Insufflation is initiated with carbon dioxide to pressures of 15  mmHg. An 8.5 mm port is placed to the left off of the midline, with blunt tipped trocar.  Pneumoperitoneum maintained w/o HD changes using the AirSeal to pressures of 15 mm Hg with CO2. No evidence of bowel injuries.  Two 8.5 mm ports placed under direct vision in each upper quadrant. The laparoscopy revealed both left sided direct and indirect defect(s).   The robot was brought ot the table and docked in the standard fashion, no collision between arms was observed. Instruments were kept under direct view at all times. For left inguinal hernia repair,  I developed a peritoneal flap. The sac(s) were reduced and dissected free from adjacent structures. We preserved the vas and the vessels, and visualized them to their convergence and beyond in the retroperitoneum.  I proceeded with pexing closed the direct hernia sac with 0 Vicryl suture.  Having completed the dissection, an extra-large left sided BARD 3D Light mesh was placed and secured at three points with interrupted 0 Vicryl to the pubic tubercle and anteriorly. There was good coverage of the direct, indirect and femoral spaces.  Second look revealed no complications or injuries.  The flap was then closed with 2-0 V-lock suture.  Peritoneal closure without defects.  Once assuring that hemostasis was adequate, all needles/sponges removed, and the robot was undocked.  Under direct visualization I placed the Veress needle into the preperitoneal space the Veress' valve was released allowing extraperitoneal CO2 to escape, it was also used to access the space for supplemental local anesthesia. The ports were removed, the abdomen desulflated.  4-0 subcuticular Monocryl was used at all skin edges. Dermabond was placed.  Patient tolerated the procedure well. There were no complications. He was taken to  the recovery room in stable condition.           Campbell Lerner, M.D., FACS 09/07/2022, 2:28 PM

## 2022-09-07 NOTE — Anesthesia Procedure Notes (Signed)
Procedure Name: Intubation Date/Time: 09/07/2022 12:37 PM  Performed by: Natasha Mead, CRNAPre-anesthesia Checklist: Patient identified, Emergency Drugs available, Suction available and Patient being monitored Patient Re-evaluated:Patient Re-evaluated prior to induction Oxygen Delivery Method: Circle system utilized Preoxygenation: Pre-oxygenation with 100% oxygen Induction Type: IV induction Ventilation: Mask ventilation without difficulty Laryngoscope Size: McGraph and 3 Grade View: Grade I Tube type: Oral Tube size: 7.0 mm Number of attempts: 1 Airway Equipment and Method: Stylet and Oral airway Placement Confirmation: ETT inserted through vocal cords under direct vision, positive ETCO2 and breath sounds checked- equal and bilateral Secured at: 21 cm Tube secured with: Tape Dental Injury: Teeth and Oropharynx as per pre-operative assessment

## 2022-09-07 NOTE — Transfer of Care (Signed)
Immediate Anesthesia Transfer of Care Note  Patient: Zachary Martinez  Procedure(s) Performed: XI ROBOTIC ASSISTED INGUINAL HERNIA (Left: Groin)  Patient Location: PACU  Anesthesia Type:General  Level of Consciousness: drowsy  Airway & Oxygen Therapy: Patient Spontanous Breathing and Patient connected to face mask oxygen  Post-op Assessment: Report given to RN and Post -op Vital signs reviewed and stable  Post vital signs: stable  Last Vitals:  Vitals Value Taken Time  BP 165/63 09/07/22 1415  Temp    Pulse 59 09/07/22 1421  Resp 15 09/07/22 1421  SpO2 95 % 09/07/22 1421  Vitals shown include unvalidated device data.  Last Pain:  Vitals:   09/07/22 1109  TempSrc: Temporal  PainSc: 0-No pain         Complications: No notable events documented.

## 2022-09-07 NOTE — Interval H&P Note (Signed)
History and Physical Interval Note:  09/07/2022 10:38 AM  Zachary Martinez  has presented today for surgery, with the diagnosis of left inguinal hernia.  The various methods of treatment have been discussed with the patient and family. After consideration of risks, benefits and other options for treatment, the patient has consented to  Procedure(s): XI ROBOTIC [place] (Left) as a surgical intervention.  The patient's history has been reviewed, patient examined, no change in status, stable for surgery.  I have reviewed the patient's chart and labs.  Questions were answered to the patient's satisfaction.   The left side is marked.   Ronny Bacon

## 2022-09-07 NOTE — Anesthesia Preprocedure Evaluation (Addendum)
Anesthesia Evaluation  Patient identified by MRN, date of birth, ID band Patient awake    Reviewed: Allergy & Precautions, H&P , NPO status , Patient's Chart, lab work & pertinent test results, reviewed documented beta blocker date and time   History of Anesthesia Complications Negative for: history of anesthetic complications  Airway Mallampati: III  TM Distance: >3 FB Neck ROM: full    Dental  (+) Teeth Intact, Dental Advidsory Given   Pulmonary neg shortness of breath, sleep apnea and Continuous Positive Airway Pressure Ventilation , neg COPD, neg recent URI, former smoker   Pulmonary exam normal        Cardiovascular Exercise Tolerance: Poor hypertension, On Medications (-) angina + CAD  (-) Past MI and (-) Cardiac Stents Normal cardiovascular exam(-) dysrhythmias (-) Valvular Problems/Murmurs Rate:Normal     Neuro/Psych neg Seizures  Neuromuscular disease  negative psych ROS   GI/Hepatic Neg liver ROS,GERD  ,,  Endo/Other  diabetesHypothyroidism    Renal/GU negative Renal ROS  negative genitourinary   Musculoskeletal   Abdominal   Peds  Hematology negative hematology ROS (+)   Anesthesia Other Findings Past Medical History: No date: Arthritis     Comment:  knees  No date: Bilateral carotid artery disease ([place]) 02/13/2013: CAD (coronary artery disease)     Comment:  3v CAD; no PCI No date: Cataract     Comment:  removed right eye. left eye forming  No date: Diverticulosis No date: Hyperlipidemia No date: Hypertension No date: Hypothyroidism No date: LVH (left ventricular hypertrophy) No date: Neuropathy No date: OSA on CPAP No date: T2DM (type 2 diabetes mellitus) ([place]) No date: Valvular insufficiency     Comment:  a.) moderate MR and TR No date: Venous insufficiency of both lower extremities   Reproductive/Obstetrics negative OB ROS                             Anesthesia  Physical Anesthesia Plan  ASA: 3  Anesthesia Plan: Spinal and General/Spinal   Post-op Pain Management:    Induction: Intravenous  PONV Risk Score and Plan: 3 and Ondansetron, Treatment may vary due to age or medical condition and Dexamethasone  Airway Management Planned: Natural Airway and Nasal Cannula  Additional Equipment:   Intra-op Plan:   Post-operative Plan: Extubation in OR  Informed Consent: I have reviewed the patients History and Physical, chart, labs and discussed the procedure including the risks, benefits and alternatives for the proposed anesthesia with the patient or authorized representative who has indicated his/her understanding and acceptance.     Dental Advisory Given  Plan Discussed with: CRNA  Anesthesia Plan Comments: (Patient consented for risks of anesthesia including but not limited to:  - adverse reactions to medications - damage to eyes, teeth, lips or other oral mucosa - nerve damage due to positioning  - sore throat or hoarseness - Damage to heart, brain, nerves, lungs, other parts of body or loss of life  Patient voiced understanding.)        Anesthesia Quick Evaluation

## 2022-09-08 DIAGNOSIS — K4090 Unilateral inguinal hernia, without obstruction or gangrene, not specified as recurrent: Secondary | ICD-10-CM

## 2022-09-13 DIAGNOSIS — Z23 Encounter for immunization: Secondary | ICD-10-CM | POA: Diagnosis not present

## 2022-09-20 ENCOUNTER — Ambulatory Visit (INDEPENDENT_AMBULATORY_CARE_PROVIDER_SITE_OTHER): Payer: Medicare Other | Admitting: Physician Assistant

## 2022-09-20 ENCOUNTER — Encounter: Payer: Self-pay | Admitting: Physician Assistant

## 2022-09-20 VITALS — BP 164/71 | HR 60 | Temp 98.6°F | Wt 241.4 lb

## 2022-09-20 DIAGNOSIS — Z09 Encounter for follow-up examination after completed treatment for conditions other than malignant neoplasm: Secondary | ICD-10-CM

## 2022-09-20 DIAGNOSIS — K4090 Unilateral inguinal hernia, without obstruction or gangrene, not specified as recurrent: Secondary | ICD-10-CM

## 2022-09-20 NOTE — Progress Notes (Signed)
[facility] POST-OP OFFICE VISIT  09/20/2022  HPI: Zachary Martinez is a 81 y.o. male 13 days s/p robotic assisted laparoscopic left inguinal hernia repair with Dr Christian Mate   He has done remarkably well No issues with pain at all No fever, chills, nausea, emesis, or bowel changes Incisions are healing well No other complaints   Vital signs: BP (!) 164/71   Pulse 60   Temp 98.6 F (37 C) (Oral)   Wt 241 lb 6.4 oz (109.5 kg)   SpO2 96%   BMI 35.65 kg/m    Physical Exam: Constitutional: Well appearing male, NAD Abdomen: Soft, non-tender, non-distended, no rebound/guarding Skin: Laparoscopic incisions are healing well, no erythema or drainage   Assessment/Plan: This is a 81 y.o. male 13 days s/p robotic assisted laparoscopic left inguinal hernia repair with Dr Christian Mate    - Pain control prn  - Reviewed wound care recommendation  - Reviewed lifting restrictions; 4-6 weeks total  - He can follow up on as needed basis; He understands to call with questions/concerns  -- Edison Simon, PA-C [facility] 09/20/2022, 2:14 PM M-F: 7am - 4pm

## 2022-09-20 NOTE — Patient Instructions (Signed)

## 2022-10-05 ENCOUNTER — Telehealth: Payer: Self-pay | Admitting: Urology

## 2022-10-05 NOTE — Telephone Encounter (Signed)
If he will refill, he would like it sent to [facility].

## 2022-10-05 NOTE — Telephone Encounter (Signed)
Pt said insurance company won't cover Silodosin anymore, but wants to know if [facility] will give him Tamsulosin or Alfuzosin?  Please give pt a call.

## 2022-10-06 MED ORDER — ALFUZOSIN HCL ER 10 MG PO TB24: 10.0000 mg | ORAL_TABLET | Freq: Every day | ORAL | 2 refills | Status: DC

## 2022-10-13 DIAGNOSIS — D0439 Carcinoma in situ of skin of other parts of face: Secondary | ICD-10-CM | POA: Diagnosis not present

## 2022-10-20 DIAGNOSIS — C44612 Basal cell carcinoma of skin of right upper limb, including shoulder: Secondary | ICD-10-CM | POA: Diagnosis not present

## 2022-11-25 ENCOUNTER — Other Ambulatory Visit: Payer: Self-pay | Admitting: Physician Assistant

## 2022-11-25 DIAGNOSIS — I10 Essential (primary) hypertension: Secondary | ICD-10-CM

## 2022-11-25 NOTE — Telephone Encounter (Signed)
Refilled 09/06/2022 #[id number] 1 rf. Requested Prescriptions  Pending Prescriptions Disp Refills   hydrALAZINE (APRESOLINE) 100 MG tablet [Pharmacy Med Name: HYDRALAZINE HYDROCHLORIDE 100 MG Tablet] 180 tablet 3    Sig: TAKE 1 TABLET TWICE DAILY     Cardiovascular:  Vasodilators Failed - 11/25/2022  2:21 PM      Failed - ANA Screen, Ifa, Serum in normal range and within 360 days    No results found for: "ANA", "ANATITER", "LABANTI"       Failed - Last BP in normal range    BP Readings from Last 1 Encounters:  09/20/22 (!) 164/71         Passed - HCT in normal range and within 360 days    Hematocrit  Date Value Ref Range Status  08/10/2022 41.0 37.5 - 51.0 % Final         Passed - HGB in normal range and within 360 days    Hemoglobin  Date Value Ref Range Status  08/10/2022 13.4 13.0 - 17.7 g/dL Final         Passed - RBC in normal range and within 360 days    RBC  Date Value Ref Range Status  08/10/2022 4.37 4.14 - 5.80 x10E6/uL Final  06/09/2021 3.75 (L) 4.22 - 5.81 MIL/uL Final         Passed - WBC in normal range and within 360 days    WBC  Date Value Ref Range Status  08/10/2022 4.3 3.4 - 10.8 x10E3/uL Final  06/09/2021 5.5 4.0 - 10.5 K/uL Final         Passed - PLT in normal range and within 360 days    Platelets  Date Value Ref Range Status  08/10/2022 187 150 - 450 x10E3/uL Final         Passed - Valid encounter within last 12 months    Recent Outpatient Visits           3 months ago Left lower quadrant abdominal pain   [place], [place], PA-C   4 months ago Type 2 diabetes mellitus with diabetic neuropathy, without long-term current use of insulin ([place])   [place] Jer[place] Banana., MD   10 months ago Essential hypertension   [place] Jerrol Banana., MD   1 year ago Primary hypertension   [place] Jerrol Banana.,  MD   1 year ago COVID-19   [facility] [place], Dionne Bucy, MD

## 2022-12-01 DIAGNOSIS — E119 Type 2 diabetes mellitus without complications: Secondary | ICD-10-CM | POA: Diagnosis not present

## 2022-12-01 DIAGNOSIS — H353131 Nonexudative age-related macular degeneration, bilateral, early dry stage: Secondary | ICD-10-CM | POA: Diagnosis not present

## 2022-12-01 DIAGNOSIS — H43813 Vitreous degeneration, bilateral: Secondary | ICD-10-CM | POA: Diagnosis not present

## 2022-12-01 DIAGNOSIS — Z961 Presence of intraocular lens: Secondary | ICD-10-CM | POA: Diagnosis not present

## 2022-12-06 ENCOUNTER — Telehealth: Payer: Self-pay | Admitting: Family Medicine

## 2022-12-06 DIAGNOSIS — I10 Essential (primary) hypertension: Secondary | ICD-10-CM

## 2022-12-06 DIAGNOSIS — E1165 Type 2 diabetes mellitus with hyperglycemia: Secondary | ICD-10-CM

## 2022-12-06 MED ORDER — INVOKAMET 150-1000 MG PO TABS: 1.0000 | ORAL_TABLET | Freq: Two times a day (BID) | ORAL | 0 refills | Status: DC

## 2022-12-06 MED ORDER — AMLODIPINE BESYLATE 10 MG PO TABS: 10.0000 mg | ORAL_TABLET | Freq: Every day | ORAL | 0 refills | Status: AC

## 2022-12-06 NOTE — Telephone Encounter (Signed)
[facility] faxed refill request for the following medications:   amLODipine (NORVASC) 10 MG tablet    INVOKAMET [phone] MG TABS   Please advise.

## 2022-12-16 DIAGNOSIS — M1711 Unilateral primary osteoarthritis, right knee: Secondary | ICD-10-CM | POA: Diagnosis not present

## 2022-12-16 DIAGNOSIS — E1142 Type 2 diabetes mellitus with diabetic polyneuropathy: Secondary | ICD-10-CM | POA: Diagnosis not present

## 2022-12-16 DIAGNOSIS — Z6836 Body mass index (BMI) 36.0-36.9, adult: Secondary | ICD-10-CM | POA: Diagnosis not present

## 2022-12-16 DIAGNOSIS — I779 Disorder of arteries and arterioles, unspecified: Secondary | ICD-10-CM | POA: Diagnosis not present

## 2022-12-21 DIAGNOSIS — M1711 Unilateral primary osteoarthritis, right knee: Secondary | ICD-10-CM | POA: Diagnosis not present

## 2022-12-30 ENCOUNTER — Other Ambulatory Visit: Payer: Self-pay | Admitting: Orthopedic Surgery

## 2023-01-04 DIAGNOSIS — I10 Essential (primary) hypertension: Secondary | ICD-10-CM | POA: Diagnosis not present

## 2023-01-04 DIAGNOSIS — E892 Postprocedural hypoparathyroidism: Secondary | ICD-10-CM | POA: Diagnosis not present

## 2023-01-04 DIAGNOSIS — I2510 Atherosclerotic heart disease of native coronary artery without angina pectoris: Secondary | ICD-10-CM | POA: Diagnosis not present

## 2023-01-04 DIAGNOSIS — E782 Mixed hyperlipidemia: Secondary | ICD-10-CM | POA: Diagnosis not present

## 2023-01-04 DIAGNOSIS — Z96652 Presence of left artificial knee joint: Secondary | ICD-10-CM | POA: Diagnosis not present

## 2023-01-04 DIAGNOSIS — E119 Type 2 diabetes mellitus without complications: Secondary | ICD-10-CM | POA: Diagnosis not present

## 2023-01-04 DIAGNOSIS — K4090 Unilateral inguinal hernia, without obstruction or gangrene, not specified as recurrent: Secondary | ICD-10-CM | POA: Diagnosis not present

## 2023-01-04 DIAGNOSIS — E039 Hypothyroidism, unspecified: Secondary | ICD-10-CM | POA: Diagnosis not present

## 2023-01-04 DIAGNOSIS — E278 Other specified disorders of adrenal gland: Secondary | ICD-10-CM | POA: Diagnosis not present

## 2023-01-04 DIAGNOSIS — G4733 Obstructive sleep apnea (adult) (pediatric): Secondary | ICD-10-CM | POA: Diagnosis not present

## 2023-01-04 DIAGNOSIS — M1711 Unilateral primary osteoarthritis, right knee: Secondary | ICD-10-CM | POA: Diagnosis not present

## 2023-01-09 ENCOUNTER — Encounter
Admission: RE | Admit: 2023-01-09 | Discharge: 2023-01-09 | Disposition: A | Discharge status: Home / Self Care | Payer: Medicare Other | Source: Ambulatory Visit | Attending: Orthopedic Surgery | Admitting: Orthopedic Surgery

## 2023-01-09 ENCOUNTER — Other Ambulatory Visit: Payer: Self-pay

## 2023-01-09 VITALS — BP 115/51 | HR 49 | Resp 16 | Ht 69.0 in | Wt 251.0 lb

## 2023-01-09 DIAGNOSIS — E1140 Type 2 diabetes mellitus with diabetic neuropathy, unspecified: Secondary | ICD-10-CM | POA: Insufficient documentation

## 2023-01-09 DIAGNOSIS — Z01810 Encounter for preprocedural cardiovascular examination: Secondary | ICD-10-CM | POA: Diagnosis not present

## 2023-01-09 DIAGNOSIS — R001 Bradycardia, unspecified: Secondary | ICD-10-CM | POA: Insufficient documentation

## 2023-01-09 DIAGNOSIS — Z01818 Encounter for other preprocedural examination: Secondary | ICD-10-CM | POA: Insufficient documentation

## 2023-01-09 LAB — URINALYSIS, ROUTINE W REFLEX MICROSCOPIC
Bacteria, UA: NONE SEEN
Bilirubin Urine: NEGATIVE
Glucose, UA: >=500 mg/dL — AB
Hgb urine dipstick: NEGATIVE
Ketones, ur: NEGATIVE mg/dL
Leukocytes,Ua: NEGATIVE
Nitrite: NEGATIVE
Protein, ur: NEGATIVE mg/dL
Specific Gravity, Urine: 1.032 — ABNORMAL HIGH (ref 1.005–1.030)
Squamous Epithelial / HPF: NONE SEEN /HPF (ref 0–5)
pH: 5 (ref 5.0–8.0)

## 2023-01-09 LAB — SURGICAL PCR SCREEN
MRSA, PCR: NEGATIVE
Staphylococcus aureus: NEGATIVE

## 2023-01-09 NOTE — Patient Instructions (Addendum)
Your procedure is scheduled on: Thursday 01/19/23 To find out your arrival time, please call [phone] between 1PM - 3PM on: Wednesday 01/18/23   Report to the Registration Desk on the 1st floor of the [facility]. Valet parking is available.  If your arrival time is 6:00 am, do not arrive before that time as the [facility] entrance doors do not open until 6:00 am.  REMEMBER: Instructions that are not followed completely may result in serious medical risk, up to and including death; or upon the discretion of your surgeon and anesthesiologist your surgery may need to be rescheduled.  Do not eat food after midnight the night before surgery.  No gum chewing or hard candies.  You may however, drink CLEAR liquids up to 2 hours before you are scheduled to arrive for your surgery. Do not drink anything within 2 hours of your scheduled arrival time.  Type 1 and Type 2 diabetics should only drink water.  In addition, your doctor has ordered for you to drink the provided:   Gatorade G2 Drinking this carbohydrate drink up to two hours before surgery helps to reduce insulin resistance and improve patient outcomes. Please complete drinking 2 hours before scheduled arrival time.  One week prior to surgery: Stop Anti-inflammatories (NSAIDS) such as Advil, Aleve, Ibuprofen, Motrin, Naproxen, Naprosyn and Aspirin based products such as Excedrin, Goody's Powder, BC Powder. You may however, continue to take Tylenol if needed for pain up until the day of surgery.  Stop ANY OVER THE COUNTER supplements 1 week before surgery .  Continue taking all prescribed medications with the exception of the following: INVOKAMET, LAST DOSE WILL BE MONDAY 01/16/23  TAKE ONLY THESE MEDICATIONS THE MORNING OF SURGERY WITH A SIP OF WATER:  amLODipine (NORVASC) 10 MG tablet  carvedilol (COREG) 6.25 MG tablet  hydrALAZINE (APRESOLINE) 100 MG tablet  levothyroxine (SYNTHROID) 88 MCG tablet   No Alcohol for 24 hours  before or after surgery.  No Smoking including e-cigarettes for 24 hours before surgery.  No chewable tobacco products for at least 6 hours before surgery.  No nicotine patches on the day of surgery.  Do not use any "recreational" drugs for at least a week (preferably 2 weeks) before your surgery.  Please be advised that the combination of cocaine and anesthesia may have negative outcomes, up to and including death. If you test positive for cocaine, your surgery will be cancelled.  On the morning of surgery brush your teeth with toothpaste and water, you may rinse your mouth with mouthwash if you wish. Do not swallow any toothpaste or mouthwash.  Use CHG Soap or wipes as directed on instruction sheet.  Do not wear lotions, powders, or perfumes.   Do not shave body hair from the neck down 48 hours before surgery.  Wear comfortable clothing (specific to your surgery type) to the hospital.  Do not wear jewelry, make-up, hairpins, clips or nail polish.  Contact lenses, hearing aids and dentures may not be worn into surgery.  Bring your C-PAP to the hospital in case you may have to spend the night.   Do not bring valuables to the hospital. [facility] is not responsible for any missing/lost belongings or valuables.   Notify your doctor if there is any change in your medical condition (cold, fever, infection).  If you are being discharged the day of surgery, you will not be allowed to drive home. You will need a responsible individual to drive you home and stay with  you for 24 hours after surgery.   If you are taking public transportation, you will need to have a responsible individual with you.  If you are being admitted to the hospital overnight, leave your suitcase in the car. After surgery it may be brought to your room.  In case of increased patient census, it may be necessary for you, the patient, to continue your postoperative care in the Same Day Surgery department.  After  surgery, you can help prevent lung complications by doing breathing exercises.  Take deep breaths and cough every 1-2 hours. Your doctor may order a device called an Incentive Spirometer to help you take deep breaths. When coughing or sneezing, hold a pillow firmly against your incision with both hands. This is called "splinting." Doing this helps protect your incision. It also decreases belly discomfort.  Surgery Visitation Policy:  Patients undergoing a surgery or procedure may have two family members or support persons with them as long as the person is not COVID-19 positive or experiencing its symptoms.   Inpatient Visitation:    Visiting hours are 7 a.m. to 8 p.m. Up to four visitors are allowed at one time in a patient room. The visitors may rotate out with other people during the day. One designated support person (adult) may remain overnight.  Due to an increase in RSV and influenza rates and associated hospitalizations, children ages 88 and under will not be able to visit patients in [facility]. Masks continue to be strongly recommended.  Please call the [facility] Dept. at [phone] if you have any questions about these instructions.     Preparing for Surgery with CHLORHEXIDINE GLUCONATE (CHG) Soap  Chlorhexidine Gluconate (CHG) Soap  o An antiseptic cleaner that kills germs and bonds with the skin to continue killing germs even after washing  o Used for showering the night before surgery and morning of surgery  Before surgery, you can play an important role by reducing the number of germs on your skin.  CHG (Chlorhexidine gluconate) soap is an antiseptic cleanser which kills germs and bonds with the skin to continue killing germs even after washing.  Please do not use if you have an allergy to CHG or antibacterial soaps. If your skin becomes reddened/irritated stop using the CHG.  1. Shower the NIGHT BEFORE SURGERY and the MORNING OF SURGERY  with CHG soap.  2. If you choose to wash your hair, wash your hair first as usual with your normal shampoo.  3. After shampooing, rinse your hair and body thoroughly to remove the shampoo.  4. Use CHG as you would any other liquid soap. You can apply CHG directly to the skin and wash gently with a scrungie or a clean washcloth.  5. Apply the CHG soap to your body only from the neck down. Do not use on open wounds or open sores. Avoid contact with your eyes, ears, mouth, and genitals (private parts). Wash face and genitals (private parts) with your normal soap.  6. Wash thoroughly, paying special attention to the area where your surgery will be performed.  7. Thoroughly rinse your body with warm water.  8. Do not shower/wash with your normal soap after using and rinsing off the CHG soap.  9. Pat yourself dry with a clean towel.  10. Wear clean pajamas to bed the night before surgery.  12. Place clean sheets on your bed the night of your first shower and do not sleep with pets.  13. Shower  again with the CHG soap on the day of surgery prior to arriving at the hospital.  14. Do not apply any deodorants/lotions/powders.  15. Please wear clean clothes to the hospital.  How to Use an Incentive Spirometer  An incentive spirometer is a tool that measures how well you are filling your lungs with each breath. Learning to take long, deep breaths using this tool can help you keep your lungs clear and active. This may help to reverse or lessen your chance of developing breathing (pulmonary) problems, especially infection. You may be asked to use a spirometer: After a surgery. If you have a lung problem or a history of smoking. After a long period of time when you have been unable to move or be active. If the spirometer includes an indicator to show the highest number that you have reached, your health care provider or respiratory therapist will help you set a goal. Keep a log of your progress as  told by your health care provider. What are the risks? Breathing too quickly may cause dizziness or cause you to pass out. Take your time so you do not get dizzy or light-headed. If you are in pain, you may need to take pain medicine before doing incentive spirometry. It is harder to take a deep breath if you are having pain. How to use your incentive spirometer  Sit up on the edge of your bed or on a chair. Hold the incentive spirometer so that it is in an upright position. Before you use the spirometer, breathe out normally. Place the mouthpiece in your mouth. Make sure your lips are closed tightly around it. Breathe in slowly and as deeply as you can through your mouth, causing the piston or the ball to rise toward the top of the chamber. Hold your breath for 3-5 seconds, or for as long as possible. If the spirometer includes a coach indicator, use this to guide you in breathing. Slow down your breathing if the indicator goes above the marked areas. Remove the mouthpiece from your mouth and breathe out normally. The piston or ball will return to the bottom of the chamber. Rest for a few seconds, then repeat the steps 10 or more times. Take your time and take a few normal breaths between deep breaths so that you do not get dizzy or light-headed. Do this every 1-2 hours when you are awake. If the spirometer includes a goal marker to show the highest number you have reached (best effort), use this as a goal to work toward during each repetition. After each set of 10 deep breaths, cough a few times. This will help to make sure that your lungs are clear. If you have an incision on your chest or abdomen from surgery, place a pillow or a rolled-up towel firmly against the incision when you cough. This can help to reduce pain while taking deep breaths and coughing. General tips When you are able to get out of bed: Walk around often. Continue to take deep breaths and cough in order to clear your  lungs. Keep using the incentive spirometer until your health care provider says it is okay to stop using it. If you have been in the hospital, you may be told to keep using the spirometer at home. Contact a health care provider if: You are having difficulty using the spirometer. You have trouble using the spirometer as often as instructed. Your pain medicine is not giving enough relief for you to use the spirometer  as told. You have a fever. Get help right away if: You develop shortness of breath. You develop a cough with bloody mucus from the lungs. You have fluid or blood coming from an incision site after you cough. Summary An incentive spirometer is a tool that can help you learn to take long, deep breaths to keep your lungs clear and active. You may be asked to use a spirometer after a surgery, if you have a lung problem or a history of smoking, or if you have been inactive for a long period of time. Use your incentive spirometer as instructed every 1-2 hours while you are awake. If you have an incision on your chest or abdomen, place a pillow or a rolled-up towel firmly against your incision when you cough. This will help to reduce pain. Get help right away if you have shortness of breath, you cough up bloody mucus, or blood comes from your incision when you cough. This information is not intended to replace advice given to you by your health care provider. Make sure you discuss any questions you have with your health care provider. Document Revised: 01/06/2020 Document Reviewed: 01/06/2020 Elsevier Patient Education  [place].

## 2023-01-11 ENCOUNTER — Encounter: Payer: Self-pay | Admitting: Orthopedic Surgery

## 2023-01-11 NOTE — Progress Notes (Signed)
Perioperative / Anesthesia Services  Pre-Admission Testing Clinical Review / Preoperative Anesthesia Consult  Date: 01/11/23  Patient Demographics:  Name: Zachary Martinez DOB:   [birth year] MRN:   [id number]  Planned Surgical Procedure(s):    Case: [id number] Date/Time: 01/19/23 1027   Procedure: TOTAL KNEE ARTHROPLASTY (Right: Knee)   Anesthesia type: Choice   Pre-op diagnosis: Primary osteoarthritis of right knee M17.11   Location: [facility] OR ROOM 01 / [facility]   Surgeons: Steffanie Rainwater, MD     NOTE: Available PAT nursing documentation and vital signs have been reviewed. Clinical nursing staff has updated patient's PMH/PSHx, current medication list, and drug allergies/intolerances to ensure comprehensive history available to assist in medical decision making as it pertains to the aforementioned surgical procedure and anticipated anesthetic course. Extensive review of available clinical information personally performed. [place] PMH and PSHx updated with any diagnoses/procedures that  may have been inadvertently omitted during his intake with the pre-admission testing department's nursing staff.  Clinical Discussion:  Zachary Martinez is a 82 y.o. male who is submitted for pre-surgical anesthesia review and clearance prior to him undergoing the above procedure. Patient is a Former Smoker (20 pack years; quit 07/1979). Pertinent PMH includes: angina, CAD, valvular heart disease, diastolic dysfunction, bradycardia, lower extremity venous insufficiency, carotid artery disease, HTN, HLD, T2DM, hypothyroidism, GERD (no daily Tx), hiatal hernia, OSAH (requires nocturnal PAP therapy), OA, lumbar spondylolisthesis, lumbar spondylosis, diabetic neuropathy, LEFT inguinal hernia, nephrolithiasis, RIGHT adrenal myelolipoma, RIGHT renal angiomyolipoma.    Patient is followed by cardiology Nehemiah Massed, MD). He was last seen in the cardiology clinic on 07/27/2022; notes reviewed. At the time  of his clinic visit, patient doing well overall from a cardiovascular perspective. Patient denied any chest pain, shortness of breath, PND, orthopnea, palpitations, significant peripheral edema, weakness, fatigue, vertiginous symptoms, or presyncope/syncope. Patient with a past medical history significant for cardiovascular diagnoses. Documented physical exam was grossly benign, providing no evidence of acute exacerbation and/or decompensation of the patient's known cardiovascular conditions.  Patient with complaints of progressive anginal symptoms prompting further cardiovascular evaluation. Diagnostic LEFT heart catheterization on 02/13/2013 that revealed moderate three-vessel nonobstructive CAD; 30% distal LM 40% ostial LAD, 50% proximal LAD, 40% D1, 30% proximal LCx, 60% mid LCx-1, 20% mid LCx-2, 60% OM1, 20% proximal RCA, and 70% RPDA . Culprit lesion leading to patient's angina felt to be RPDA lesion, however PCI was deferred opting for medical management.   TTE performed on 06/29/2020 revealed normal left ventricular systolic function with mild LVH; LVEF 55 to 60%.  There was mild mitral, tricuspid, and pulmonary valvular regurgitation.  Left atrium was mildly enlarged.  There was no evidence of a significant transvalvular gradient to suggest stenosis.   Myocardial perfusion imaging study performed on 06/29/2020 revealed an LVEF of 50%.  There was no evidence of stress-induced myocardial ischemia or arrhythmia; no scintigraphic evidence of scar.  Blood pressure mildly elevated at 140/80 mmHg on currently prescribed CCB (amlodipine), beta-blocker (carvedilol), and vasodilator (hydralazine) therapies.  Patient is on atorvastatin for his HLD diagnosis and further ASCVD prevention.  T2DM well-controlled on currently prescribed regimen; last HgbA1c was 6.5% when checked on 07/05/2022. Since last being seen by cardiology, A1c has been rechecked and found to be stable at 6.6% on 01/04/2023. In the setting of  known cardiovascular disease and concurrent T2DM, patient is on an SGLT2i (canagliflozin) for added cardiovascular and renovascular protection. Patient does have an OSAH diagnosis and is reported to be compliant with prescribed  nocturnal PAP therapy. Functional capacity, as defined by DASI, is documented as being >/= 4 METS.  No changes were made to his medication regimen.  Patient to continue regular visits with outpatient cardiology for ongoing management of his cardiovascular diagnoses.   Zachary Martinez is scheduled for an TOTAL KNEE ARTHROPLASTY (Right: Knee) on 01/19/2023 with Dr. Steffanie Rainwater, MD.  Given patient's past medical history significant for cardiovascular diagnoses, presurgical cardiac clearance was sought by the PAT team. Per cardiology, "this patient is optimized for surgery and may proceed with the planned procedural course with a LOW risk of significant perioperative cardiovascular complications".   In review of his medication reconciliation, the patient is not noted to be taking any type of anticoagulation or antiplatelet therapies that would need to be held during his perioperative course.  Patient denies previous perioperative complications with anesthesia in the past. In review of the available records, it is noted that patient underwent a neuraxial anesthetic course here at [facility] ([facility]) in 08/2022 without documented complications.      01/09/2023   11:02 AM 09/20/2022    1:57 PM 09/07/2022    3:17 PM  Vitals with BMI  Height '5\' 9"'$     Weight 251 lbs 241 lbs 6 oz   BMI [id number] 0000000   Systolic [id number]  Diastolic 51 71 67  Pulse 49 60 62    Providers/Specialists:   NOTE: Primary physician provider listed below. Patient may have been seen by APP or partner within same practice.   PROVIDER ROLE / SPECIALTY LAST Danice Goltz, MD Orthopedics (Surgeon) 12/21/2022  Eulas Post, MD Primary Care Provider 01/04/2023   Serafina Royals, MD Cardiology 07/27/2022   Allergies:  Ace inhibitors  Current Home Medications:   No current facility-administered medications for this encounter.    alfuzosin (UROXATRAL) 10 MG 24 hr tablet   amLODipine (NORVASC) 10 MG tablet   atorvastatin (LIPITOR) 40 MG tablet   Canagliflozin-metFORMIN HCl (INVOKAMET) [phone] MG TABS   carvedilol (COREG) 6.25 MG tablet   EPINEPHrine 0.3 mg/0.3 mL IJ SOAJ injection   hydrALAZINE (APRESOLINE) 100 MG tablet   ibuprofen (ADVIL) 800 MG tablet   levothyroxine (SYNTHROID) 88 MCG tablet   pioglitazone (ACTOS) 30 MG tablet   History:   Past Medical History:  Diagnosis Date   Angiomyolipoma of right kidney    Arthritis of both knees    Bilateral carotid artery disease (HCC)    Bradycardia    CAD (coronary artery disease) 02/13/2013   a.) LHC 02/13/2013: 30% dLM, 40% oLAD, 50% pLAD, 40% D1, 30% pLCx, 60% mLCx-1, 20% mLCx-2, 60% OM1, 20% pRCA, 70% RPDA -- med mgmt; b.) MPI 06/29/2020: EF 50%, no ishemia/scar.   Cataract    a.) s/p extraction with IOL placement on RIGHT; b.) forming in LEFT eye   Diabetic polyneuropathy (HCC)    Diastolic dysfunction    a.) TTE 07/04/2017: EF 50%, mod LVH, mild MAC, mod BAE, mod RVE, mild PR, mod MR/TR, G1DD.   Diverticulosis    GERD (gastroesophageal reflux disease)    Hepatic steatosis    Hiatal hernia    Hyperlipidemia    Hypertension    Hypothyroidism    Left inguinal hernia    Lumbar spondylosis    Myelolipoma of right adrenal gland 04/05/2020   a.) CT abd 04/06/2011: measures 2.9 cm; b.) CT A/P 08/12/2022: measured 5.7 cm   Nephrolithiasis    OSA on CPAP  Skin cancer    chest and ears   Splenomegaly    Spondylolisthesis at L5-S1 level    T2DM (type 2 diabetes mellitus) ([place])    Valvular heart disease    a.) TTE 12/01/2014: mod MR/TR, sev PR; b.) TTE 07/04/2017: mild PR, mod MR/TR; c.) TTE 06/29/2020: mild MR/TR/PR   Venous insufficiency of both lower extremities    Past  Surgical History:  Procedure Laterality Date   CATARACT EXTRACTION W/PHACO Right 11/01/2016   Procedure: CATARACT EXTRACTION PHACO AND INTRAOCULAR LENS PLACEMENT ([place]);  Surgeon: Birder Robson, MD;  Location: [facility] ORS;  Service: Ophthalmology;  Laterality: Right;  Lot [id number] H [place]: oo:35.4 AP%; 22.4 CDE: 7.91   COLONOSCOPY     CYST REMOVAL NECK     EYE SURGERY Right    to remove blood from busted blood vessel   HARDWARE REMOVAL Left 04/01/2021   Procedure: Screw removal from left proximal tibia;  Surgeon: Hessie Knows, MD;  Location: [facility] ORS;  Service: Orthopedics;  Laterality: Left;   INGUINAL HERNIA REPAIR     KNEE ARTHROSCOPY Right 2003   KNEE ARTHROSCOPY Left 2013   KNEE ARTHROSCOPY Right 2005   LEFT HEART CATH AND CORONARY ANGIOGRAPHY N/A 02/13/2013   Procedure: LEFT HEART CATH AND CORONARY ANGIOGRAPHY; Location: [facility]; Surgeon: Serafina Royals, MD   NASAL POLYP SURGERY N/A 1978   POLYPECTOMY     TIBIA FRACTURE SURGERY Left 2012   TONSILLECTOMY AND ADENOIDECTOMY     TOTAL KNEE ARTHROPLASTY Left 06/08/2021   Procedure: TOTAL KNEE ARTHROPLASTY;  Surgeon: Hessie Knows, MD;  Location: [facility] ORS;  Service: Orthopedics;  Laterality: Left;   UVULOPLASTY N/A 2000   Family History  Problem Relation Age of Onset   Colon cancer Brother 76   Colon cancer Maternal Aunt 54   Colon cancer Maternal Aunt 69   Kidney disease Mother    Anemia Mother    Diabetes Brother    Hyperlipidemia Brother    Hypertension Brother    Colon polyps Brother    Esophageal cancer Neg Hx    Rectal cancer Neg Hx    Stomach cancer Neg Hx    Social History   Tobacco Use   Smoking status: Former    Packs/day: 1.00    Years: 20.00    Total pack years: 20.00    Types: Cigarettes    Quit date: 07/24/1979    Years since quitting: 43.4   Smokeless tobacco: Former    Quit date: 11/01/1979  Vaping Use   Vaping Use: Never used  Substance Use Topics   Alcohol use: Yes    Comment: wine 2-3xs a year    Drug use: No    Pertinent Clinical Results:  LABS:    Ref Range & Units 01/04/2023  WBC (White Blood Cell Count) 4.1 - 10.2 10^3/uL 4.6  RBC (Red Blood Cell Count) 4.69 - 6.13 10^6/uL 4.37 Low   Hemoglobin 14.1 - 18.1 gm/dL 13.3 Low   Hematocrit 40.0 - 52.0 % 40.3  MCV (Mean Corpuscular Volume) 80.0 - 100.0 fl 92.2  MCH (Mean Corpuscular Hemoglobin) 27.0 - 31.2 pg 30.4  MCHC (Mean Corpuscular Hemoglobin Concentration) 32.0 - 36.0 gm/dL 33.0  Platelet Count 150 - 450 10^3/uL 166  RDW-CV (Red Cell Distribution Width) 11.6 - 14.8 % 14.4  MPV (Mean Platelet Volume) 9.4 - 12.4 fl 9.3 Low   Neutrophils 1.50 - 7.80 10^3/uL 3.10  Lymphocytes 1.00 - 3.60 10^3/uL 0.90 Low   Mixed Count 0.10 - 0.90 10^3/uL 0.60  Neutrophil %  32.0 - 70.0 % 67.6  Lymphocyte % 10.0 - 50.0 % 20.3  Mixed % 3.0 - 14.4 % 12.1  Resulting Agency  [place] - LAB  Specimen Collected: 01/04/23 11:28   Performed by: [facility] Last Resulted: 01/04/23 11:40  Received From: [place]  Result Received: 01/06/23 09:13    Ref Range & Units 01/04/2023  Glucose 70 - 110 mg/dL 126 High   Sodium 136 - 145 mmol/L 140  Potassium 3.6 - 5.1 mmol/L 4.1  Chloride 97 - 109 mmol/L 111 High   Carbon Dioxide (CO2) 22.0 - 32.0 mmol/L 26.9  Urea Nitrogen (BUN) 7 - 25 mg/dL 20  Creatinine 0.7 - 1.3 mg/dL 0.8  Glomerular Filtration Rate (eGFR) >60 mL/min/1.73sq m 89  Calcium 8.7 - 10.3 mg/dL 10.4 High   AST 8 - 39 U/L 16  ALT 6 - 57 U/L 16  Alk Phos (alkaline Phosphatase) 34 - 104 U/L 97  Albumin 3.5 - 4.8 g/dL 3.8  Bilirubin, Total 0.3 - 1.2 mg/dL 0.5  Protein, Total 6.1 - 7.9 g/dL 5.9 Low   A/G Ratio 1.0 - 5.0 gm/dL 1.8  Resulting Agency  [place] - LAB  Specimen Collected: 01/04/23 11:28   Performed by: [place]: 01/04/23 16:18  Received From: [place]  Result Received: 01/06/23 09:13    Ref Range & Units  01/04/2023  Hemoglobin A1C 4.2 - 5.6 % 6.6 High   Average Blood Glucose (Calc) mg/dL 143  Resulting Agency     Specimen Collected: 01/04/23 11:28   Performed by: [place]: 01/04/23 16:25  Received From: [place]  Result Received: 01/06/23 09:13   Hospital Outpatient Visit on 01/09/2023  Component Date Value Ref Range Status   Color, Urine 01/09/2023 YELLOW (A)  YELLOW Final   APPearance 01/09/2023 CLEAR (A)  CLEAR Final   Specific Gravity, Urine 01/09/2023 1.032 (H)  1.005 - 1.030 Final   pH 01/09/2023 5.0  5.0 - 8.0 Final   Glucose, UA 01/09/2023 >=500 (A)  NEGATIVE mg/dL Final   Hgb urine dipstick 01/09/2023 NEGATIVE  NEGATIVE Final   Bilirubin Urine 01/09/2023 NEGATIVE  NEGATIVE Final   Ketones, ur 01/09/2023 NEGATIVE  NEGATIVE mg/dL Final   Protein, ur 01/09/2023 NEGATIVE  NEGATIVE mg/dL Final   Nitrite 01/09/2023 NEGATIVE  NEGATIVE Final   Leukocytes,Ua 01/09/2023 NEGATIVE  NEGATIVE Final   RBC / HPF 01/09/2023 0-5  0 - 5 RBC/hpf Final   WBC, UA 01/09/2023 0-5  0 - 5 WBC/hpf Final   Bacteria, UA 01/09/2023 NONE SEEN  NONE SEEN Final   Squamous Epithelial / HPF 01/09/2023 NONE SEEN  0 - 5 /HPF Final   Mucus 01/09/2023 PRESENT   Final   Performed at [facility], [address] [place], [place] [address]   ABO/RH(D) 01/09/2023 O POS   Final   Antibody Screen 01/09/2023 POS   Final   Sample Expiration 01/09/2023 01/23/2023,2359   Final   Extend sample reason 01/09/2023 NO TRANSFUSIONS OR PREGNANCY IN THE PAST 3 MONTHS   Final   Antibody Identification [id number]    Final                   Value:NON SPECIFIC ANTIBODY REACTIVITY Performed at [facility], [address] [place], [place] [address]    MRSA, PCR 01/09/2023 NEGATIVE  NEGATIVE Final   Staphylococcus aureus 01/09/2023 NEGATIVE  NEGATIVE Final   Comment: (NOTE) The Xpert SA Assay (FDA approved for NASAL specimens in patients 12 years of age  and older), is one component of a comprehensive surveillance program. It is not intended to diagnose infection nor to guide or monitor treatment. Performed at [facility], [address] [place], [place] [address]     ECG: Date: 01/09/2023 Time ECG obtained: 1201 PM Rate: 48 bpm Rhythm:  Sinus bradycardia with first-degree AV block Axis (leads I and aVF): Normal Intervals: PR 222 ms. QRS 112 ms. QTc 376 ms. ST segment and T wave changes: No evidence of acute ST segment elevation or depression Comparison: Similar to previous tracing obtained on 08/31/2022   IMAGING / PROCEDURES: DIAGNOSTIC RADIOGRAPHS OF RIGHT KNEE 4+ VIEWS performed on 12/16/2022 Patient has 100% loss of joint space in the medial compartment with slight varus deformity with sclerotic changes along the medial tibial plateau with mild spurring along  the medial tibial plateau.   Arthritic changes present in the lateral compartment with adequate joint space.   Advanced patellofemoral changes present with spurring and sclerotic changes present, patella tracks laterally in the trochlear groove   CT ABDOMEN PELVIS W CONTRAST performed on 08/12/2022 Diverticulosis of colon. There is mild stranding in the fat planes adjacent to the distal course of descending colon at the level of left iliac crest which may suggest residual changes from chronic diverticulitis or mild acute diverticulitis. There is no evidence of any pericolic abscess. There is large left inguinal hernia containing portion of sigmoid colon and fat without signs of obstruction or incarceration.There is no evidence of intestinal obstruction or pneumoperitoneum. There is no hydronephrosis. Appendix is not dilated. Fatty liver.  Enlarged spleen.  Small hiatal hernia.  5.7 cm myelolipoma in right adrenal with interval increase in size.  4 mm right renal calculus.  Bilateral cortical and parapelvic cysts in the kidneys.  Small angiomyolipoma is seen  in right kidney. Small pericardial effusion.  Coronary artery calcifications.  Spondylolisthesis at L5-S1 level.  Lumbar spondylosis.   MYOCARDIAL PERFUSION IMAGING STUDY (LEXISCAN) performed on 06/29/2020 LVEF 50% Regional wall motion reveals normal myocardial thickening and wall motion No artifacts noted Left ventricular cavity size normal No evidence of stress-induced myocardial ischemia or arrhythmia The overall quality of the study is good   TRANSTHORACIC ECHOCARDIOGRAM performed on 06/29/2020 LVEF 55-60% Normal left ventricular systolic function with mild LVH Normal right ventricular systolic function Mild left atrial enlargement Mild MR, TR, PR No AR No valvular stenosis No evidence of pericardial effusion   LEFT HEART CATHETERIZATION AND CORONARY ANGIOGRAPHY performed on 02/13/2013 Moderate three-vessel CAD 30% stenosis distal LM 40% stenosis ostial LAD 50% stenosis proximal LAD 40% stenosis first diagonal 30% stenosis proximal LCx 60% stenosis mid LCx 20% stenosis mid LCx 60% stenosis OM1 20% stenosis proximal RCA 70% stenosis RPDA Culprit lesion felt to be stenotic RPDA Recommendations: Defer PCI and continue aggressive medical management  Impression and Plan:  Zachary Martinez has been referred for pre-anesthesia review and clearance prior to him undergoing the planned anesthetic and procedural courses. Available labs, pertinent testing, and imaging results were personally reviewed by me in preparation for upcoming operative/procedural course. [facility] medical record has been updated following extensive record review and patient interview with PAT staff.   This patient has been appropriately cleared by cardiology with an overall LOW risk of significant perioperative cardiovascular complications. Based on clinical review performed today (01/11/23), barring any significant acute changes in the patient's overall condition,  it is anticipated that he will be able to  proceed with the planned surgical intervention. Any acute changes in clinical condition may necessitate his procedure being postponed and/or cancelled. Patient will meet with anesthesia team (MD and/or CRNA) on the day of his procedure for preoperative evaluation/assessment. Questions regarding anesthetic course will be fielded at that time.   Pre-surgical instructions were reviewed with the patient during his PAT appointment, and questions were fielded to satisfaction by PAT clinical staff. He has been instructed on which medications that he will need to hold prior to surgery, as well as the ones that have been deemed safe/appropriate to take of the day of his procedure. As part of the general education provided by PAT, patient made aware both verbally and in writing, that he would need to abstain from the use of any illegal substances during his perioperative course.  He was advised that failure to follow the provided instructions could necessitate case cancellation or result serious perioperative complications up to and including death. Patient encouraged to contact PAT and/or his surgeon's office to discuss any questions or concerns that may arise prior to surgery; verbalized understanding.   Honor Loh, MSN, APRN, FNP-C, CEN [facility]  Peri-operative Services Nurse Practitioner Phone: [phone] Fax: [phone] 01/11/23 11:13 AM  NOTE: This note has been prepared using [employer]. Despite my best ability to proofread, there is always the potential that unintentional transcriptional errors may still occur from this process.

## 2023-01-16 DIAGNOSIS — S41112A Laceration without foreign body of left upper arm, initial encounter: Secondary | ICD-10-CM | POA: Diagnosis not present

## 2023-01-19 ENCOUNTER — Ambulatory Visit: Payer: Medicare Other

## 2023-01-19 ENCOUNTER — Encounter: Payer: Self-pay | Admitting: Orthopedic Surgery

## 2023-01-19 ENCOUNTER — Ambulatory Visit: Payer: Medicare Other | Admitting: Urgent Care

## 2023-01-19 ENCOUNTER — Other Ambulatory Visit: Payer: Self-pay

## 2023-01-19 ENCOUNTER — Encounter
Admission: RE | Disposition: A | Discharge status: Home Health | Payer: Self-pay | Source: Home / Self Care | Attending: Orthopedic Surgery

## 2023-01-19 ENCOUNTER — Observation Stay
Admission: RE | Admit: 2023-01-19 | Discharge: 2023-01-20 | Disposition: A | Discharge status: Home Health | Payer: Medicare Other | Attending: Orthopedic Surgery | Admitting: Orthopedic Surgery

## 2023-01-19 DIAGNOSIS — E1140 Type 2 diabetes mellitus with diabetic neuropathy, unspecified: Secondary | ICD-10-CM

## 2023-01-19 DIAGNOSIS — E039 Hypothyroidism, unspecified: Secondary | ICD-10-CM | POA: Diagnosis not present

## 2023-01-19 DIAGNOSIS — Z79899 Other long term (current) drug therapy: Secondary | ICD-10-CM | POA: Diagnosis not present

## 2023-01-19 DIAGNOSIS — Z7984 Long term (current) use of oral hypoglycemic drugs: Secondary | ICD-10-CM | POA: Diagnosis not present

## 2023-01-19 DIAGNOSIS — Z96651 Presence of right artificial knee joint: Secondary | ICD-10-CM

## 2023-01-19 DIAGNOSIS — M1711 Unilateral primary osteoarthritis, right knee: Principal | ICD-10-CM | POA: Insufficient documentation

## 2023-01-19 DIAGNOSIS — I2510 Atherosclerotic heart disease of native coronary artery without angina pectoris: Secondary | ICD-10-CM | POA: Insufficient documentation

## 2023-01-19 DIAGNOSIS — Z96652 Presence of left artificial knee joint: Secondary | ICD-10-CM | POA: Insufficient documentation

## 2023-01-19 DIAGNOSIS — E1143 Type 2 diabetes mellitus with diabetic autonomic (poly)neuropathy: Secondary | ICD-10-CM | POA: Diagnosis not present

## 2023-01-19 DIAGNOSIS — I10 Essential (primary) hypertension: Secondary | ICD-10-CM | POA: Insufficient documentation

## 2023-01-19 HISTORY — DX: Type 2 diabetes mellitus with diabetic polyneuropathy: E11.42

## 2023-01-19 HISTORY — PX: TOTAL KNEE ARTHROPLASTY: SHX125

## 2023-01-19 LAB — GLUCOSE, CAPILLARY
Glucose-Capillary: 172 mg/dL — ABNORMAL HIGH (ref 70–99)
Glucose-Capillary: 167 mg/dL — ABNORMAL HIGH (ref 70–99)
Glucose-Capillary: 228 mg/dL — ABNORMAL HIGH (ref 70–99)
Glucose-Capillary: 187 mg/dL — ABNORMAL HIGH (ref 70–99)

## 2023-01-19 SURGERY — ARTHROPLASTY, KNEE, TOTAL
Anesthesia: Spinal | Site: Knee | Laterality: Right

## 2023-01-19 MED ORDER — SODIUM CHLORIDE (PF) 0.9 % IJ SOLN
INTRAMUSCULAR | Status: DC | PRN
  Administered 2023-01-19: 71 mL via INTRAMUSCULAR

## 2023-01-19 MED ORDER — METOCLOPRAMIDE HCL 5 MG/ML IJ SOLN: 5.0000 mg | Freq: Three times a day (TID) | INTRAMUSCULAR | Status: DC | PRN

## 2023-01-19 MED ORDER — INSULIN ASPART 100 UNIT/ML IJ SOLN
0.0000 [IU] | Freq: Three times a day (TID) | INTRAMUSCULAR | Status: DC
  Administered 2023-01-19: 5 [IU] via SUBCUTANEOUS
  Administered 2023-01-20: 2 [IU] via SUBCUTANEOUS
  Filled 2023-01-19 (×2): qty 1

## 2023-01-19 MED ORDER — SODIUM CHLORIDE 0.9 % IR SOLN
Status: DC | PRN
  Administered 2023-01-19: 3000 mL

## 2023-01-19 MED ORDER — MIDAZOLAM HCL 2 MG/2ML IJ SOLN
INTRAMUSCULAR | Status: AC
  Filled 2023-01-19: qty 2

## 2023-01-19 MED ORDER — MIDAZOLAM HCL 5 MG/5ML IJ SOLN
INTRAMUSCULAR | Status: DC | PRN
  Administered 2023-01-19: 2 mg via INTRAVENOUS

## 2023-01-19 MED ORDER — ENOXAPARIN SODIUM 30 MG/0.3ML IJ SOSY
30.0000 mg | PREFILLED_SYRINGE | Freq: Two times a day (BID) | INTRAMUSCULAR | Status: DC
  Administered 2023-01-20: 30 mg via SUBCUTANEOUS
  Filled 2023-01-19: qty 0.3

## 2023-01-19 MED ORDER — ACETAMINOPHEN 10 MG/ML IV SOLN
INTRAVENOUS | Status: AC
  Filled 2023-01-19: qty 100

## 2023-01-19 MED ORDER — PHENOL 1.4 % MT LIQD: 1.0000 | OROMUCOSAL | Status: DC | PRN

## 2023-01-19 MED ORDER — CHLORHEXIDINE GLUCONATE 0.12 % MT SOLN
OROMUCOSAL | Status: AC
  Filled 2023-01-19: qty 15

## 2023-01-19 MED ORDER — LIDOCAINE HCL (PF) 2 % IJ SOLN
INTRAMUSCULAR | Status: AC
  Filled 2023-01-19: qty 5

## 2023-01-19 MED ORDER — GLYCOPYRROLATE 0.2 MG/ML IJ SOLN
INTRAMUSCULAR | Status: AC
  Filled 2023-01-19: qty 1

## 2023-01-19 MED ORDER — SODIUM CHLORIDE 0.9 % IV SOLN: INTRAVENOUS | Status: DC

## 2023-01-19 MED ORDER — PROPOFOL 10 MG/ML IV BOLUS
INTRAVENOUS | Status: DC | PRN
  Administered 2023-01-19: 20 mg via INTRAVENOUS
  Administered 2023-01-19: 25 ug/kg/min via INTRAVENOUS

## 2023-01-19 MED ORDER — PROPOFOL 10 MG/ML IV BOLUS
INTRAVENOUS | Status: AC
  Filled 2023-01-19: qty 20

## 2023-01-19 MED ORDER — FAMOTIDINE 20 MG PO TABS
ORAL_TABLET | ORAL | Status: AC
  Filled 2023-01-19: qty 1

## 2023-01-19 MED ORDER — SODIUM CHLORIDE FLUSH 0.9 % IV SOLN
INTRAVENOUS | Status: AC
  Filled 2023-01-19: qty 40

## 2023-01-19 MED ORDER — OXYCODONE HCL 5 MG PO TABS: 5.0000 mg | ORAL_TABLET | Freq: Once | ORAL | Status: AC

## 2023-01-19 MED ORDER — METOCLOPRAMIDE HCL 5 MG PO TABS: 5.0000 mg | ORAL_TABLET | Freq: Three times a day (TID) | ORAL | Status: DC | PRN

## 2023-01-19 MED ORDER — BUPIVACAINE LIPOSOME 1.3 % IJ SUSP
INTRAMUSCULAR | Status: AC
  Filled 2023-01-19: qty 40

## 2023-01-19 MED ORDER — MORPHINE SULFATE (PF) 4 MG/ML IV SOLN
INTRAVENOUS | Status: AC
  Filled 2023-01-19: qty 1

## 2023-01-19 MED ORDER — PANTOPRAZOLE SODIUM 40 MG PO TBEC
40.0000 mg | DELAYED_RELEASE_TABLET | Freq: Every day | ORAL | Status: DC
  Administered 2023-01-19 – 2023-01-20 (×2): 40 mg via ORAL
  Filled 2023-01-19 (×2): qty 1

## 2023-01-19 MED ORDER — TRANEXAMIC ACID-NACL 1000-0.7 MG/100ML-% IV SOLN
1000.0000 mg | INTRAVENOUS | Status: AC
  Administered 2023-01-19 (×2): 1000 mg via INTRAVENOUS

## 2023-01-19 MED ORDER — KETOROLAC TROMETHAMINE 15 MG/ML IJ SOLN
INTRAMUSCULAR | Status: AC
  Filled 2023-01-19: qty 1

## 2023-01-19 MED ORDER — TRANEXAMIC ACID-NACL 1000-0.7 MG/100ML-% IV SOLN
INTRAVENOUS | Status: AC
  Filled 2023-01-19: qty 100

## 2023-01-19 MED ORDER — AMLODIPINE BESYLATE 10 MG PO TABS
10.0000 mg | ORAL_TABLET | Freq: Every day | ORAL | Status: DC
  Administered 2023-01-20: 10 mg via ORAL
  Filled 2023-01-19: qty 1

## 2023-01-19 MED ORDER — KETOROLAC TROMETHAMINE 15 MG/ML IJ SOLN
7.5000 mg | Freq: Four times a day (QID) | INTRAMUSCULAR | Status: AC
  Administered 2023-01-19 – 2023-01-20 (×4): 7.5 mg via INTRAVENOUS
  Filled 2023-01-19 (×3): qty 1

## 2023-01-19 MED ORDER — CARVEDILOL 3.125 MG PO TABS
6.2500 mg | ORAL_TABLET | Freq: Two times a day (BID) | ORAL | Status: DC
  Administered 2023-01-19: 6.25 mg via ORAL
  Filled 2023-01-19: qty 2

## 2023-01-19 MED ORDER — BUPIVACAINE HCL (PF) 0.5 % IJ SOLN
INTRAMUSCULAR | Status: AC
  Filled 2023-01-19: qty 10

## 2023-01-19 MED ORDER — OXYCODONE HCL 5 MG PO TABS
5.0000 mg | ORAL_TABLET | Freq: Once | ORAL | Status: AC
  Administered 2023-01-19: 5 mg via ORAL

## 2023-01-19 MED ORDER — KETOROLAC TROMETHAMINE 30 MG/ML IJ SOLN
INTRAMUSCULAR | Status: AC
  Filled 2023-01-19: qty 1

## 2023-01-19 MED ORDER — CEFAZOLIN SODIUM-DEXTROSE 2-4 GM/100ML-% IV SOLN
INTRAVENOUS | Status: AC
  Filled 2023-01-19: qty 100

## 2023-01-19 MED ORDER — DEXAMETHASONE SODIUM PHOSPHATE 10 MG/ML IJ SOLN
8.0000 mg | Freq: Once | INTRAMUSCULAR | Status: AC
  Administered 2023-01-19: 8 mg via INTRAVENOUS

## 2023-01-19 MED ORDER — EPINEPHRINE PF 1 MG/ML IJ SOLN
INTRAMUSCULAR | Status: AC
  Filled 2023-01-19: qty 2

## 2023-01-19 MED ORDER — EPHEDRINE SULFATE (PRESSORS) 50 MG/ML IJ SOLN
INTRAMUSCULAR | Status: DC | PRN
  Administered 2023-01-19: 5 mg via INTRAVENOUS

## 2023-01-19 MED ORDER — BUPIVACAINE HCL (PF) 0.25 % IJ SOLN
INTRAMUSCULAR | Status: AC
  Filled 2023-01-19: qty 60

## 2023-01-19 MED ORDER — FENTANYL CITRATE (PF) 100 MCG/2ML IJ SOLN: 25.0000 ug | INTRAMUSCULAR | Status: DC | PRN

## 2023-01-19 MED ORDER — HYDROCODONE-ACETAMINOPHEN 5-325 MG PO TABS
1.0000 | ORAL_TABLET | ORAL | Status: DC | PRN
  Administered 2023-01-19 – 2023-01-20 (×3): 2 via ORAL
  Filled 2023-01-19 (×3): qty 2

## 2023-01-19 MED ORDER — TRAMADOL HCL 50 MG PO TABS: 50.0000 mg | ORAL_TABLET | Freq: Four times a day (QID) | ORAL | Status: DC | PRN

## 2023-01-19 MED ORDER — HYDRALAZINE HCL 50 MG PO TABS
100.0000 mg | ORAL_TABLET | Freq: Two times a day (BID) | ORAL | Status: DC
  Administered 2023-01-19 – 2023-01-20 (×2): 100 mg via ORAL
  Filled 2023-01-19 (×2): qty 2

## 2023-01-19 MED ORDER — MENTHOL 3 MG MT LOZG: 1.0000 | LOZENGE | OROMUCOSAL | Status: DC | PRN

## 2023-01-19 MED ORDER — ONDANSETRON HCL 4 MG PO TABS: 4.0000 mg | ORAL_TABLET | Freq: Four times a day (QID) | ORAL | Status: DC | PRN

## 2023-01-19 MED ORDER — OXYCODONE HCL 5 MG PO TABS: 5.0000 mg | ORAL_TABLET | Freq: Once | ORAL | Status: DC | PRN

## 2023-01-19 MED ORDER — PROPOFOL 1000 MG/100ML IV EMUL
INTRAVENOUS | Status: AC
  Filled 2023-01-19: qty 100

## 2023-01-19 MED ORDER — ACETAMINOPHEN 500 MG PO TABS
1000.0000 mg | ORAL_TABLET | Freq: Three times a day (TID) | ORAL | Status: DC
  Administered 2023-01-19: 1000 mg via ORAL
  Filled 2023-01-19 (×2): qty 2

## 2023-01-19 MED ORDER — CHLORHEXIDINE GLUCONATE 0.12 % MT SOLN: 15.0000 mL | Freq: Once | OROMUCOSAL | Status: AC

## 2023-01-19 MED ORDER — CHLORHEXIDINE GLUCONATE 0.12 % MT SOLN
OROMUCOSAL | Status: AC
  Administered 2023-01-19: 15 mL via OROMUCOSAL
  Filled 2023-01-19: qty 15

## 2023-01-19 MED ORDER — OXYCODONE HCL 5 MG/5ML PO SOLN: 5.0000 mg | Freq: Once | ORAL | Status: DC | PRN

## 2023-01-19 MED ORDER — EPHEDRINE 5 MG/ML INJ
INTRAVENOUS | Status: AC
  Filled 2023-01-19: qty 5

## 2023-01-19 MED ORDER — LEVOTHYROXINE SODIUM 88 MCG PO TABS
88.0000 ug | ORAL_TABLET | Freq: Every day | ORAL | Status: DC
  Administered 2023-01-20: 88 ug via ORAL
  Filled 2023-01-19: qty 1

## 2023-01-19 MED ORDER — DEXAMETHASONE SODIUM PHOSPHATE 10 MG/ML IJ SOLN
INTRAMUSCULAR | Status: AC
  Filled 2023-01-19: qty 1

## 2023-01-19 MED ORDER — CEFAZOLIN SODIUM-DEXTROSE 2-4 GM/100ML-% IV SOLN
2.0000 g | INTRAVENOUS | Status: AC
  Administered 2023-01-19: 2 g via INTRAVENOUS

## 2023-01-19 MED ORDER — DOCUSATE SODIUM 100 MG PO CAPS
100.0000 mg | ORAL_CAPSULE | Freq: Two times a day (BID) | ORAL | Status: DC
  Administered 2023-01-19 – 2023-01-20 (×3): 100 mg via ORAL
  Filled 2023-01-19 (×3): qty 1

## 2023-01-19 MED ORDER — ACETAMINOPHEN 10 MG/ML IV SOLN
INTRAVENOUS | Status: DC | PRN
  Administered 2023-01-19: 1000 mg via INTRAVENOUS

## 2023-01-19 MED ORDER — INSULIN ASPART 100 UNIT/ML IJ SOLN: 0.0000 [IU] | Freq: Every day | INTRAMUSCULAR | Status: DC

## 2023-01-19 MED ORDER — ORAL CARE MOUTH RINSE: 15.0000 mL | Freq: Once | OROMUCOSAL | Status: AC

## 2023-01-19 MED ORDER — MORPHINE SULFATE (PF) 4 MG/ML IV SOLN
0.5000 mg | INTRAVENOUS | Status: DC | PRN
  Administered 2023-01-19: 1 mg via INTRAVENOUS

## 2023-01-19 MED ORDER — ONDANSETRON HCL 4 MG/2ML IJ SOLN: 4.0000 mg | Freq: Four times a day (QID) | INTRAMUSCULAR | Status: DC | PRN

## 2023-01-19 MED ORDER — IRRISEPT - 450ML BOTTLE WITH 0.05% CHG IN STERILE WATER, USP 99.95% OPTIME
TOPICAL | Status: DC | PRN
  Administered 2023-01-19: 450 mL

## 2023-01-19 MED ORDER — BUPIVACAINE HCL (PF) 0.5 % IJ SOLN
INTRAMUSCULAR | Status: DC | PRN
  Administered 2023-01-19: 2.8 mL via INTRATHECAL

## 2023-01-19 MED ORDER — CEFAZOLIN SODIUM-DEXTROSE 2-4 GM/100ML-% IV SOLN
2.0000 g | Freq: Four times a day (QID) | INTRAVENOUS | Status: AC
  Administered 2023-01-19 (×2): 2 g via INTRAVENOUS
  Filled 2023-01-19 (×2): qty 100

## 2023-01-19 MED ORDER — OXYCODONE HCL 5 MG PO TABS
ORAL_TABLET | ORAL | Status: AC
  Filled 2023-01-19: qty 1

## 2023-01-19 MED ORDER — ATORVASTATIN CALCIUM 20 MG PO TABS
40.0000 mg | ORAL_TABLET | Freq: Every evening | ORAL | Status: DC
  Administered 2023-01-19: 40 mg via ORAL
  Filled 2023-01-19: qty 2

## 2023-01-19 MED ORDER — OXYCODONE HCL 5 MG PO TABS
ORAL_TABLET | ORAL | Status: AC
  Administered 2023-01-19: 5 mg via ORAL
  Filled 2023-01-19: qty 1

## 2023-01-19 MED ORDER — GLYCOPYRROLATE 0.2 MG/ML IJ SOLN
INTRAMUSCULAR | Status: DC | PRN
  Administered 2023-01-19: .2 mg via INTRAVENOUS

## 2023-01-19 MED ORDER — FAMOTIDINE 20 MG PO TABS
ORAL_TABLET | ORAL | Status: AC
  Administered 2023-01-19: 20 mg
  Filled 2023-01-19: qty 1

## 2023-01-19 SURGICAL SUPPLY — 84 items
ADH SKN CLS APL DERMABOND .7 (GAUZE/BANDAGES/DRESSINGS) ×1
APL PRP STRL LF DISP 70% ISPRP (MISCELLANEOUS) ×2
BLADE PATELLA W-PILOT HOLE 35 (BLADE) IMPLANT
BLADE SAW SAG 25X90X1.19 (BLADE) ×1 IMPLANT
BLADE SAW SAG 29X58X.64 (BLADE) ×1 IMPLANT
BNDG CMPR 5X62 HK CLSR LF (GAUZE/BANDAGES/DRESSINGS) ×1
BNDG ELASTIC 6INX 5YD STR LF (GAUZE/BANDAGES/DRESSINGS) ×1 IMPLANT
BOWL CEMENT MIX W/ADAPTER (MISCELLANEOUS) ×1 IMPLANT
BSPLAT TIB 5D H CMNT STM RT (Knees) ×1 IMPLANT
CEMENT BONE R 1X40 (Cement) ×2 IMPLANT
CHLORAPREP W/TINT 26 (MISCELLANEOUS) ×2 IMPLANT
COMP FEM PERSONA RT SZ11 (Knees) ×1 IMPLANT
COMPONENT FEM PERSONA RT SZ11 (Knees) IMPLANT
COOLER POLAR GLACIER W/PUMP (MISCELLANEOUS) ×1 IMPLANT
CUFF TOURN SGL QUICK 24 (TOURNIQUET CUFF)
CUFF TOURN SGL QUICK 34 (TOURNIQUET CUFF)
CUFF TRNQT CYL 24X4X16.5-23 (TOURNIQUET CUFF) IMPLANT
CUFF TRNQT CYL 34X4.125X (TOURNIQUET CUFF) IMPLANT
DERMABOND ADVANCED .7 DNX12 (GAUZE/BANDAGES/DRESSINGS) ×1 IMPLANT
DRAPE 3/4 80X56 (DRAPES) ×1 IMPLANT
DRAPE INCISE IOBAN 66X60 STRL (DRAPES) ×1 IMPLANT
DRSG MEPILEX SACRM 8.7X9.8 (GAUZE/BANDAGES/DRESSINGS) ×1 IMPLANT
DRSG OPSITE POSTOP 4X10 (GAUZE/BANDAGES/DRESSINGS) IMPLANT
DRSG OPSITE POSTOP 4X8 (GAUZE/BANDAGES/DRESSINGS) IMPLANT
ELECT CAUTERY BLADE 6.4 (BLADE) ×1 IMPLANT
ELECT REM PT RETURN 9FT ADLT (ELECTROSURGICAL) ×1
ELECTRODE REM PT RTRN 9FT ADLT (ELECTROSURGICAL) ×1 IMPLANT
GLOVE BIO SURGEON STRL SZ8 (GLOVE) ×1 IMPLANT
GLOVE BIOGEL PI IND STRL 8 (GLOVE) ×1 IMPLANT
GLOVE PI ORTHO PRO STRL 7.5 (GLOVE) ×2 IMPLANT
GLOVE PI ORTHO PRO STRL SZ8 (GLOVE) ×2 IMPLANT
GOWN SRG XL LVL 3 NONREINFORCE (GOWNS) IMPLANT
GOWN STRL NON-REIN TWL XL LVL3 (GOWNS) ×1
GOWN STRL REUS W/ TWL LRG LVL3 (GOWN DISPOSABLE) ×1 IMPLANT
GOWN STRL REUS W/ TWL XL LVL3 (GOWN DISPOSABLE) ×2 IMPLANT
GOWN STRL REUS W/TWL LRG LVL3 (GOWN DISPOSABLE) ×1
GOWN STRL REUS W/TWL XL LVL3 (GOWN DISPOSABLE) ×1
HANDLE YANKAUER SUCT OPEN TIP (MISCELLANEOUS) ×1 IMPLANT
HDLS TROCR DRIL PIN KNEE 75 (PIN) ×1
HOLDER FOLEY CATH W/STRAP (MISCELLANEOUS) ×1 IMPLANT
HOOD PEEL AWAY T7 (MISCELLANEOUS) ×3 IMPLANT
INSERT TIBIAL PERSONA RT 11 (Joint) IMPLANT
IV NS IRRIG 3000ML ARTHROMATIC (IV SOLUTION) ×1 IMPLANT
JET LAVAGE IRRISEPT WOUND (IRRIGATION / IRRIGATOR) ×1
KIT TURNOVER KIT A (KITS) ×1 IMPLANT
LAVAGE JET IRRISEPT WOUND (IRRIGATION / IRRIGATOR) IMPLANT
MANIFOLD NEPTUNE II (INSTRUMENTS) ×1 IMPLANT
MARKER SKIN DUAL TIP RULER LAB (MISCELLANEOUS) ×1 IMPLANT
MAT ABSORB  FLUID 56X50 GRAY (MISCELLANEOUS) ×1
MAT ABSORB FLUID 56X50 GRAY (MISCELLANEOUS) ×1 IMPLANT
NDL HYPO 21X1.5 SAFETY (NEEDLE) ×1 IMPLANT
NDL SAFETY ECLIP 18X1.5 (MISCELLANEOUS) ×1 IMPLANT
NEEDLE HYPO 21X1.5 SAFETY (NEEDLE) ×1 IMPLANT
NS IRRIG 500ML POUR BTL (IV SOLUTION) ×1 IMPLANT
PACK TOTAL KNEE (MISCELLANEOUS) ×1 IMPLANT
PAD WRAPON POLAR KNEE (MISCELLANEOUS) ×1 IMPLANT
PENCIL SMOKE EVACUATOR (MISCELLANEOUS) ×1 IMPLANT
PIN DRILL HDLS TROCAR 75 4PK (PIN) IMPLANT
PULSAVAC PLUS IRRIG FAN TIP (DISPOSABLE) ×1
SCREW FEMALE HEX FIX 25X2.5 (ORTHOPEDIC DISPOSABLE SUPPLIES) IMPLANT
SCREW HEX HEADED 3.5X27 DISP (ORTHOPEDIC DISPOSABLE SUPPLIES) IMPLANT
SLEEVE SCD COMPRESS KNEE MED (STOCKING) ×1 IMPLANT
SOLUTION IRRIG SURGIPHOR (IV SOLUTION) ×1 IMPLANT
STEM POLY PAT PLY 35M KNEE (Knees) IMPLANT
STEM TIB ST PERS 14+30 (Stem) IMPLANT
STEM TIBIA 5 DEG SZ H R KNEE (Knees) IMPLANT
SUT DVC 2 QUILL PDO  T11 36X36 (SUTURE) ×1
SUT DVC 2 QUILL PDO T11 36X36 (SUTURE) ×1 IMPLANT
SUT QUILL MONODERM 3-0 PS-2 (SUTURE) ×1 IMPLANT
SUT VIC AB 0 CT1 36 (SUTURE) ×1 IMPLANT
SUT VIC AB 2-0 CT2 27 (SUTURE) ×2 IMPLANT
SUT VICRYL 1-0 27IN ABS (SUTURE) ×1
SUTURE VICRYL 1-0 27IN ABS (SUTURE) ×1 IMPLANT
SYR 10ML LL (SYRINGE) ×1 IMPLANT
SYR 30ML LL (SYRINGE) ×2 IMPLANT
SYR 3ML LL SCALE MARK (SYRINGE) ×1 IMPLANT
TAPE CLOTH 3X10 WHT NS LF (GAUZE/BANDAGES/DRESSINGS) ×1 IMPLANT
TIBIA STEM 5 DEG SZ H R KNEE (Knees) ×1 IMPLANT
TIP FAN IRRIG PULSAVAC PLUS (DISPOSABLE) ×1 IMPLANT
TOWEL OR 17X26 4PK STRL BLUE (TOWEL DISPOSABLE) IMPLANT
TRAP FLUID SMOKE EVACUATOR (MISCELLANEOUS) ×1 IMPLANT
TRAY FOLEY SLVR 16FR LF STAT (SET/KITS/TRAYS/PACK) ×1 IMPLANT
WATER STERILE IRR 1000ML POUR (IV SOLUTION) ×1 IMPLANT
WRAPON POLAR PAD KNEE (MISCELLANEOUS) ×1

## 2023-01-19 NOTE — H&P (Signed)
History of Present Illness: The patient is an 82 y.o. male seen in clinic today for evaluation of right knee pain. Patient is a known history of right knee osteoarthritis and is status post right knee arthroscopy in 2009. He has undergone cortisone injections most recently in December 2022 with very minimal relief. He reports severe pain along the medial joint line of his right knee which he describes as constant and worsening which causes swelling and buckling of his knee. He reports the pain is severe up to an 10 out of 10 and affects his activities of daily living and ability to walk. The patient reports his knee pain has become a functional disability for him affecting him on a daily basis and preventing him from participating activities of daily living. The patient denies fevers, chills, numbness, tingling, shortness of breath, chest pain, recent illness, or any trauma.  Patient is a diabetic with a hemoglobin A1c last checked at 6.6, he is not a smoker, his BMI is 36.6  Past Medical History: Past Medical History:  Diagnosis Date  CAD (coronary artery disease)  Diabetes mellitus type 2, uncomplicated (CMS-HCC)  Hyperlipidemia  Hypertension  Sleep apnea   Past Surgical History: Past Surgical History:  Procedure Laterality Date  KNEE ARTHROSCOPY 02/29/2008  Right  KNEE ARTHROSCOPY 04/20/2012  left  Arthroscopic superior labrum anterior and posterior repair, arthroscopic debridement, arthroscopic subacromial decompression, mini open rotator cuff repair, and mini open biceps tenodesis, right shoulder Right 01/01/2015  Screw removal from left proximal tibia 04/01/2021  ADENOIDECTOMY  CYSTECTOMY  femoral condyle  Left lateral tibial plateau fracture arthroscopy with open reduction and internal fixation  Sleep apnea surgery  TONSILLECTOMY   Past Family History: Family History  Problem Relation Age of Onset  Colon cancer Other  Myocardial Infarction (Heart attack) Father    Medications: Current Outpatient Medications Ordered in Epic  Medication Sig Dispense Refill  amLODIPine (NORVASC) 10 MG tablet Take 10 mg by mouth once daily.  atorvastatin (LIPITOR) 40 MG tablet Take 40 mg by mouth once daily.  canagliflozin-metformin 150-1,000 mg Tab Take 1 tablet by mouth 2 (two) times daily  carvedilol (COREG) 6.25 MG tablet Take 6.25 mg by mouth 2 (two) times daily with meals.  cholecalciferol (VITAMIN D3) 1000 unit capsule Take 1 capsule by mouth once daily  EPINEPHrine (EPIPEN) 0.3 mg/0.3 mL auto-injector Inject into the muscle as needed  hydrALAZINE (APRESOLINE) 100 MG tablet Take 1 tablet by mouth 2 (two) times daily  levothyroxine (SYNTHROID, LEVOTHROID) 88 MCG tablet Take 88 mcg by mouth once daily. Take on an empty stomach with a glass of water at least 30-60 minutes before breakfast.  pioglitazone (ACTOS) 30 MG tablet Take 30 mg by mouth once daily.   No current Epic-ordered facility-administered medications on file.   Allergies: Allergies  Allergen Reactions  Ace Inhibitors Anaphylaxis    Visit Vitals: There were no vitals filed for this visit.   Review of Systems:  A comprehensive 14 point ROS was performed, reviewed, and the pertinent orthopaedic findings are documented in the HPI.  Physical Exam: General/Constitutional: No apparent distress: well-nourished and well developed. Eyes: Pupils equal, round with synchronous movement. Respiratory: "Non-labored breathing. Cardiac: Heart rate is regular. Integumentary: No impressive skin lesions present, except as noted in detailed exam. Neuro/Psych: Normal mood and affect, oriented to person, place and time.  Comprehensive Knee Exam: Gait Non-antalgic and fluid  Alignment Neutral   Inspection Right Left  Skin Normal appearance with no obvious deformity. No ecchymosis or erythema.  Normal appearance with no obvious deformity. Healed midline incision. No ecchymosis or erythema.  Soft Tissue No  focal soft tissue swelling, mild ankle swelling bilateral and symmetric, with some mild pitting (per patient chronic in nature) No focal soft tissue swelling  Quad Atrophy None None   Palpation  Right Left  Tenderness Medial joint line tenderness to palpation No peripatellar, patellar tendon, quad tendon, medial/lateral joint line pain  Crepitus No patellofemoral or tibiofemoral crepitus No patellofemoral or tibiofemoral crepitus  Effusion None None   Range of Motion              Right       Left  Flexion 0-115 0-115  Extension Full knee extension without hyperextension Full knee extension without hyperextension   Ligamentous Exam Right Left  Lachman Normal N/A  Valgus 0 Normal Normal  Valgus 30 Normal Normal  Varus 0 Normal Normal  Varus 30 Normal Normal  Anterior Drawer Normal Normal  Posterior Drawer Normal Normal   Meniscal Exam Right  Hyperflexion Test Positive  Hyperextension Test Positive  McMurray's Positive   Neurovascular Right Left  Quadriceps Strength 5/5 5/5  Hamstring Strength 5/5 5/5  Hip Abductor Strength 4/5 4/5  Distal Motor Normal Normal  Distal Sensory Normal light touch sensation Normal light touch sensation  Distal Pulses Normal Normal    Imaging Studies: I have reviewed AP, lateral, sunrise, and flexed PA weightbearing x-rays of the right knee which show medial joint space narrowing with bone-on-bone articulation, osteophyte formation, subchondral sclerosis, and cyst formation. Additionally has significant patellofemoral disease with patellofemoral joint space narrowing and sclerosis. Patient's left knee is status post total knee arthroplasty and a previous proximal tibia open reduction internal fixation with a lateral plate and several screws still in place. The left knee tibial component is in several degrees of varus and the patella is resurfaced otherwise no complicating features or any evidence of periprosthetic fracture or  loosening.  Assessment:  Right knee osteoarthritis  Plan: Shion is an 82 year old male who presents with right knee bone on bone arthritis. Based upon the patient's continued symptoms and failure to respond to conservative treatment, I have recommended a right total knee replacement for this patient. A long discussion took place with the patient describing what a total joint replacement is and what the procedure would entail. A knee model, similar to the implants that will be used during the operation, was utilized to demonstrate the implants. Choices of implant manufactures were discussed and reviewed. The ability to secure the implant utilizing cement or cementless (press fit) fixation was discussed. The approach and exposure was discussed.   The hospitalization and post-operative care and rehabilitation were also discussed. The use of perioperative antibiotics and DVT prophylaxis were discussed. The risk, benefits and alternatives to a surgical intervention were discussed at length with the patient. The patient was also advised of risks related to the medical comorbidities and elevated body mass index (BMI). A lengthy discussion took place to review the most common complications including but not limited to: stiffness, loss of function, complex regional pain syndrome, deep vein thrombosis, pulmonary embolus, heart attack, stroke, infection, wound breakdown, numbness, intraoperative fracture, damage to nerves, tendon,muscles, arteries or other blood vessels, death and other possible complications from anesthesia. The patient was told that we will take steps to minimize these risks by using sterile technique, antibiotics and DVT prophylaxis when appropriate and follow the patient postoperatively in the office setting to monitor progress. The possibility of recurrent pain, no improvement  in pain and actual worsening of pain were also discussed with the patient.   The discharge plan of care focused on the  patient going home following surgery. The patient was encouraged to make the necessary arrangements to have someone stay with them when they are discharged home.   The benefits of surgery were discussed with the patient including the potential for improving the patient's current clinical condition through operative intervention. Alternatives to surgical intervention including continued conservative management were also discussed in detail. All questions were answered to the satisfaction of the patient. The patient participated and agreed to the plan of care as well as the use of the recommended implants for their total knee replacement surgery. An information packet was given to the patient to review prior to surgery.   Patient received medical and cardiac clearance for surgery. All questions answered the patient agrees with above plan to proceed with planning for a right total knee replacement  Portions of this record have been created using Dragon dictation software. Dictation errors have been sought, but may not have been identified and corrected.  Steffanie Rainwater MD

## 2023-01-19 NOTE — Anesthesia Preprocedure Evaluation (Signed)
Anesthesia Evaluation  Patient identified by MRN, date of birth, ID band Patient awake    Reviewed: Allergy & Precautions, NPO status , Patient's Chart, lab work & pertinent test results  History of Anesthesia Complications Negative for: history of anesthetic complications  Airway Mallampati: III  TM Distance: >3 FB Neck ROM: full    Dental no notable dental hx.    Pulmonary sleep apnea and Continuous Positive Airway Pressure Ventilation , former smoker   Pulmonary exam normal        Cardiovascular Exercise Tolerance: Good hypertension, On Medications and On Home Beta Blockers + CAD  Normal cardiovascular exam+ dysrhythmias + Valvular Problems/Murmurs (TR, PR) MR   EKG 3/11: Sinus bradycardia with 1st degree A-V block   Neuro/Psych  Neuromuscular disease  negative psych ROS   GI/Hepatic Neg liver ROS, hiatal hernia,GERD  Medicated,,  Endo/Other  diabetesHypothyroidism    Renal/GU Renal disease     Musculoskeletal   Abdominal  (+) + obese  Peds  Hematology negative hematology ROS (+)   Anesthesia Other Findings Past Medical History: No date: Angiomyolipoma of right kidney No date: Arthritis of both knees No date: Bilateral carotid artery disease (HCC) No date: Bradycardia 02/13/2013: CAD (coronary artery disease)     Comment:  a.) LHC 02/13/2013: 30% dLM, 40% oLAD, 50% pLAD, 40% D1,              30% pLCx, 60% mLCx-1, 20% mLCx-2, 60% OM1, 20% pRCA, 70%               RPDA -- med mgmt; b.) MPI 06/29/2020: EF 50%, no               ishemia/scar. No date: Cataract     Comment:  a.) s/p extraction with IOL placement on RIGHT; b.)               forming in LEFT eye No date: Diabetic polyneuropathy ([place]) No date: Diastolic dysfunction     Comment:  a.) TTE 07/04/2017: EF 50%, mod LVH, mild MAC, mod BAE,               mod RVE, mild PR, mod MR/TR, G1DD. No date: Diverticulosis No date: GERD (gastroesophageal reflux  disease) No date: Hepatic steatosis No date: Hiatal hernia No date: Hyperlipidemia No date: Hypertension No date: Hypothyroidism No date: Left inguinal hernia No date: Lumbar spondylosis 04/05/2020: Myelolipoma of right adrenal gland     Comment:  a.) CT abd 04/06/2011: measures 2.9 cm; b.) CT A/P               08/12/2022: measured 5.7 cm No date: Nephrolithiasis No date: OSA on CPAP No date: Skin cancer     Comment:  chest and ears No date: Splenomegaly No date: Spondylolisthesis at L5-S1 level No date: T2DM (type 2 diabetes mellitus) ([place]) No date: Valvular heart disease     Comment:  a.) TTE 12/01/2014: mod MR/TR, sev PR; b.) TTE               07/04/2017: mild PR, mod MR/TR; c.) TTE 06/29/2020: mild               MR/TR/PR No date: Venous insufficiency of both lower extremities  Past Surgical History: 11/01/2016: CATARACT EXTRACTION W/PHACO; Right     Comment:  Procedure: CATARACT EXTRACTION PHACO AND INTRAOCULAR               LENS PLACEMENT (IOC);  Surgeon: Birder Robson, MD;  Location: [facility] ORS;  Service: Ophthalmology;  Laterality:              Right;  Lot [id number] H [place]: oo:35.4 AP%; 22.4 CDE: 7.91 No date: COLONOSCOPY No date: CYST REMOVAL NECK No date: EYE SURGERY; Right     Comment:  to remove blood from busted blood vessel 04/01/2021: HARDWARE REMOVAL; Left     Comment:  Procedure: Screw removal from left proximal tibia;                Surgeon: Hessie Knows, MD;  Location: [facility] ORS;                Service: Orthopedics;  Laterality: Left; No date: INGUINAL HERNIA REPAIR 2003: KNEE ARTHROSCOPY; Right 2013: KNEE ARTHROSCOPY; Left 2005: KNEE ARTHROSCOPY; Right 02/13/2013: LEFT HEART CATH AND CORONARY ANGIOGRAPHY; N/A     Comment:  Procedure: LEFT HEART CATH AND CORONARY ANGIOGRAPHY;               Location: [place]; Surgeon: Serafina Royals, MD 1978: NASAL POLYP SURGERY; N/A No date: POLYPECTOMY 2012: TIBIA FRACTURE SURGERY; Left No date:  TONSILLECTOMY AND ADENOIDECTOMY 06/08/2021: TOTAL KNEE ARTHROPLASTY; Left     Comment:  Procedure: TOTAL KNEE ARTHROPLASTY;  Surgeon: Hessie Knows, MD;  Location: [facility];  Service: Orthopedics;               Laterality: Left; 2000: UVULOPLASTY; N/A  BMI    Body Mass Index: 37.05 kg/m      Reproductive/Obstetrics negative OB ROS                             Anesthesia Physical Anesthesia Plan  ASA: 3  Anesthesia Plan: Spinal   Post-op Pain Management: Ofirmev IV (intra-op)*   Induction: Intravenous  PONV Risk Score and Plan: Propofol infusion, TIVA and Treatment may vary due to age or medical condition  Airway Management Planned: Natural Airway and Nasal Cannula  Additional Equipment:   Intra-op Plan:   Post-operative Plan:   Informed Consent: I have reviewed the patients History and Physical, chart, labs and discussed the procedure including the risks, benefits and alternatives for the proposed anesthesia with the patient or authorized representative who has indicated his/her understanding and acceptance.     Dental Advisory Given  Plan Discussed with: Anesthesiologist, CRNA and Surgeon  Anesthesia Plan Comments: (Patient reports no bleeding problems and no anticoagulant use.  Plan for spinal with backup GA  Patient consented for risks of anesthesia including but not limited to:  - adverse reactions to medications - damage to eyes, teeth, lips or other oral mucosa - nerve damage due to positioning  - risk of bleeding, infection and or nerve damage from spinal that could lead to paralysis - risk of headache or failed spinal - damage to teeth, lips or other oral mucosa - sore throat or hoarseness - damage to heart, brain, nerves, lungs, other parts of body or loss of life  Patient voiced understanding.)        Anesthesia Quick Evaluation

## 2023-01-19 NOTE — Transfer of Care (Signed)
Immediate Anesthesia Transfer of Care Note  Patient: Zachary Martinez  Procedure(s) Performed: TOTAL KNEE ARTHROPLASTY (Right: Knee)  Patient Location: PACU  Anesthesia Type:Spinal  Level of Consciousness: awake, alert , and oriented  Airway & Oxygen Therapy: Patient Spontanous Breathing  Post-op Assessment: Report given to RN and Post -op Vital signs reviewed and stable  Post vital signs: Reviewed and stable  Last Vitals:  Vitals Value Taken Time  BP 118/57 01/19/23 1000  Temp 36.1 1000  Pulse 45 01/19/23 1001  Resp 15 01/19/23 1001  SpO2 96 % 01/19/23 1001  Vitals shown include unvalidated device data.  Last Pain:  Vitals:   01/19/23 0621  PainSc: 0-No pain         Complications: No notable events documented.

## 2023-01-19 NOTE — Op Note (Signed)
Patient Name: Zachary Martinez  [id number]  Pre-Operative Diagnosis: Right knee Osteoarthritis  Post-Operative Diagnosis: (same)  Procedure: Right Total Knee Arthroplasty  Components/Implants: Femur: Persona Size 11 CR   Tibia: Persona Size H w/ 14x20mm stem extension  Poly: 71mm MC  Patella: 35x42mm symmetric cemented  Femoral Valgus Cut Angle: 5 degrees  Distal Femoral Re-cut: none  Patella Resurfacing: yes   Date of Surgery: 01/19/2023  Surgeon: Steffanie Rainwater MD  Assistant: Dorise Hiss pA (present and scrubbed throughout the case, critical for assistance with exposure, retraction, instrumentation, and closure)   Anesthesiologist: Danne Baxter  Anesthesia: Spinal  Tourniquet Time: 64 min  EBL: 75cc  IVF: [id number]  Complications: None   Brief history: The patient is a 82 year old male with a history of osteoarthritis of the right knee with pain limiting their range of motion and activities of daily living, which has failed multiple attempts at conservative therapy.  The risks and benefits of total knee arthroplasty as definitive surgical treatment were discussed with the patient, who opted to proceed with the operation.  After outpatient medical clearance and optimization was completed the patient was admitted to [facility] for the procedure.  All preoperative films were reviewed and an appropriate surgical plan was made prior to surgery. Preoperative range of motion was 0 to 115 . The patient was identified as having a varus alignment.   Description of procedure: The patient was brought to the operating room where laterality was confirmed by all those present to be the right side.   Spinal anesthesia was administered and the patient received an intravenous dose of antibiotics for surgical prophylaxis and a dose of tranexamic acid.  Patient is positioned supine on the operating room table with all bony prominences well-padded.  A well-padded tourniquet was  applied to the right thigh.  The knee was then prepped and draped in usual sterile fashion with multiple layers of adhesive and nonadhesive drapes.  All of those present in the operating room participated in a surgical timeout laterality and patient were confirmed.   An Esmarch was wrapped around the extremity and the leg was elevated and the knee flexed.  The tourniquet was inflated to a pressure of 275 mmHg. The Esmarch was removed and the leg was brought down to full extension.  The patella and tibial tubercle identified and outlined using a marking pen and a midline skin incision was made with a knife carried through the subcutaneous tissue down to the extensor retinaculum.  After exposure of the extensor mechanism the medial parapatellar arthrotomy was performed with a scalpel and electrocautery extending down medial and distal to the tibial tubercle taking care to avoid incising the patellar tendon.   A standard medial release was performed over the proximal tibia.  The knee was brought into extension in order to excise the fat pad taking care not to damage the patella tendon.  The superior soft tissue was removed from the anterior surface of the distal femur to visualize for the procedure.  The knee was then brought into flexion with the patella subluxed laterally and subluxing the tibia anteriorly.  The ACL was transected and removed with electrocautery and additional soft tissue was removed from the proximal surface of the tibia to fully expose. The PCL was found to be intact and was preserved.  An extramedullary tibial cutting guide was then applied to the leg with a spring-loaded ankle clamp placed around the distal tibia just above the malleoli the angulation of the guide was adjusted  to give some posterior slope in the tibial resection with an appropriate varus/valgus alignment.  The resection guide was then pinned to the proximal tibia and the proximal tibial surface was resected with an  oscillating saw.  Careful attention was paid to ensure the blade did not disrupt any of the soft tissues including any lateral or medial ligament.  Attention was then turned to the femur, with the knee slightly flexed a opening drill was used to enter the medullary canal of the femur.  After removing the drill marrow was suctioned out to decompress the distal femur.  An intramedullary femoral guide was then inserted into the drill hole and the alignment guide was seated firmly against the distal end of the medial femoral condyle.  The distal femoral cutting guide was then attached and pinned securely to the anterior surface of the femur and the intramedullary rod and alignment guide was removed.  Distal femur resection was then performed with an oscillating saw with retractors protecting medial and laterally.   The distal cutting block was then removed and the extension gap was checked with a spacer.  Extension gap was found to be appropriately sized to accommodate the spacer block.   The femoral sizing guide was then placed securely into the posterior condyles of the femur and the femoral size was measured and determined to be 11.  The size 11; 4-in-1 cutting guide was placed in position and secured with 2 pins.  The anterior posterior and chamfer resections were then performed with an oscillating saw.  Bony fragments and osteophytes were then removed.  Using a lamina spreader the posterior medial and lateral condyles were checked for additional osteophytes and posterior soft tissue remnants.  Any remaining meniscus was removed at this time.  Periarticular injection was performed in the meniscal rims and posterior capsule with aspiration performed to ensure no intravascular injection.   The tibia was then exposed and the tibial trial was pinned onto the plateau after confirming appropriate orientation and rotation.  Using the drill bushing the tibia was prepared to the appropriate drill depth.  Tibial  broach impactor was then driven through the punch guide using a mallet.  The femoral trial component was then inserted onto the femur. A trial tibial polyethylene bearing was then placed and the knee was reduced.  The knee achieved full extension with no hyperextension and was found to be balanced in flexion and extension with the trials in place.  The knee was then brought into full extension the patella was everted and held with 2 Kocher clamps.  The articular surface of the patella was then resected with an patella reamer and saw after careful measurement with a caliper.  The patella was then prepared with the drill guide and a trial patella was placed.  The knee was then taken through range of motion and it was found that the patella articulated appropriately with the trochlea and good patellofemoral motion without subluxation.    The correct final components for implantation were confirmed and opened by the circulator nurse.  The prepared surfaces of the patella femur and tibia were cleaned with pulsatile lavage to remove all blood fat and other material and then the surfaces were dried.  3 bags of cement were mixed under vacuum and the components were cemented into place.  Excess cement was removed with curettes and forceps. A trial polyethylene tibial component was placed and the knee was brought into extension to allow the cement to set.  At this time  the periarticular injection cocktail was placed in the soft tissues surrounding the knee.  After full curing of the cement the balance of the knee was checked again and the final polyethylene size was confirmed. The tibial component was irrigated and locking mechanism checked to ensure it was clear of debris. The real polyethylene tibial component was implanted and the knee was brought through a range of motion.   The knee was then irrigated with copious amount of normal saline via pulsatile lavage to remove all loose bodies and other debris.  The knee was  then irrigated with irrisept wash and reirrigated with saline.  The tourniquet was then dropped and all bleeding vessels were identified and coagulated.  The arthrotomy was approximated with #1 Vicryl and closed with #2 Quill suture.  The knee was brought into slight flexion and the subcutaneous tissues were closed with 0 Vicryl, 2-0 Vicryl and a running subcuticular 3-0 monoderm quil suture.  Skin was then glued with Dermabond.  A sterile adhesive dressing was then placed along with a sequential compression device to the calf, a Ted stocking, and a cryotherapy cuff.   Sponge, needle, and Lap counts were all correct at the end of the case.   The patient was transferred off of the operating room table to a hospital bed, good pulses were found distally on the operative side.  The patient was transferred to the recovery room in stable condition.

## 2023-01-19 NOTE — Evaluation (Signed)
Physical Therapy Evaluation Patient Details Name: Zachary Martinez MRN: [id number] DOB: [birth year] Today's Date: 01/19/2023  History of Present Illness  Pt is an 82 y/o M admitted on 01/19/23 for scheduled R TKA. PMH: CAD, DM, HLD, HTN, sleep apnea, L TKA (2022)  Clinical Impression  Pt seen for PT evaluation with pt agreeable & pt's wife & sons present for session. Pt reports prior to admission he was independent without AD residing on the main level of a multilevel home with 1+2 steps without rails (has a post) to enter. PT provided pt with HEP handout & pt performs RLE ROM/strengthening exercises with PRN instructional cuing for technique.  Pt is able to complete bed mobility with mod I, STS with CGA, and ambulate to nurses station & back with RW & CGA; PT provides cuing for upright posture & forward gaze during gait. Anticipate pt will progress well & will attempt stair negotiation during tomorrow's AM session.   Recommendations for follow up therapy are one component of a multi-disciplinary discharge planning process, led by the attending physician.  Recommendations may be updated based on patient status, additional functional criteria and insurance authorization.  Follow Up Recommendations Follow physician's recommendations for discharge plan and follow up therapies      Assistance Recommended at Discharge Intermittent Supervision/Assistance  Patient can return home with the following  A little help with walking and/or transfers;A little help with bathing/dressing/bathroom;Assistance with cooking/housework;Help with stairs or ramp for entrance;Assist for transportation    Equipment Recommendations None recommended by PT (pt reports he already has a RW at home)  Recommendations for Other Services       Functional Status Assessment Patient has had a recent decline in their functional status and demonstrates the ability to make significant improvements in function in a reasonable and  predictable amount of time.     Precautions / Restrictions Precautions Precautions: Fall Restrictions Weight Bearing Restrictions: Yes RLE Weight Bearing: Weight bearing as tolerated      Mobility  Bed Mobility Overal bed mobility: Modified Independent             General bed mobility comments: supine>sit with HOB elevated    Transfers Overall transfer level: Needs assistance Equipment used: Rolling walker (2 wheels) Transfers: Sit to/from Stand Sit to Stand: Supervision, Min guard           General transfer comment: Education re: hand placement during STS    Ambulation/Gait Ambulation/Gait assistance: Min guard Gait Distance (Feet): 70 Feet Assistive device: Rolling walker (2 wheels) Gait Pattern/deviations: Decreased step length - left, Decreased step length - right, Decreased stride length Gait velocity: decreased     General Gait Details: decreased heel strike RLE  Stairs            Wheelchair Mobility    Modified Rankin (Stroke Patients Only)       Balance Overall balance assessment: Needs assistance Sitting-balance support: Feet supported Sitting balance-Leahy Scale: Good     Standing balance support: During functional activity, Bilateral upper extremity supported Standing balance-Leahy Scale: Good                               Pertinent Vitals/Pain Pain Assessment Pain Assessment: 0-10 Pain Score: 5  Pain Location: R knee Pain Descriptors / Indicators: Discomfort Pain Intervention(s): Monitored during session, Ice applied (polar care applied at end of session)    Home Living Family/patient expects to be discharged to:: Private residence  Living Arrangements: Spouse/significant other Available Help at Discharge: Family;Available 24 hours/day Type of Home: House Home Access: Stairs to enter Entrance Stairs-Rails:  (no formal rail but has a post he can hold to) Entrance Stairs-Number of Steps: 1+2   Home Layout:  Able to live on main level with bedroom/bathroom;Multi-level Home Equipment: Rolling Walker (2 wheels)      Prior Function Prior Level of Function : Independent/Modified Independent             Mobility Comments: Independent without AD       Hand Dominance        Extremity/Trunk Assessment   Upper Extremity Assessment Upper Extremity Assessment: Overall WFL for tasks assessed    Lower Extremity Assessment Lower Extremity Assessment: RLE deficits/detail RLE Deficits / Details: 3-/5 knee extension in sitting       Communication   Communication: No difficulties  Cognition Arousal/Alertness: Awake/alert Behavior During Therapy: WFL for tasks assessed/performed Overall Cognitive Status: Within Functional Limits for tasks assessed                                          General Comments      Exercises Total Joint Exercises Ankle Circles/Pumps: AROM, Supine, Right, 10 reps Quad Sets: AROM, Supine, Right, Strengthening, 10 reps Short Arc Quad: AROM, Supine, Right, Strengthening, 10 reps Heel Slides: Supine, AROM, Strengthening, Right, 10 reps Hip ABduction/ADduction: AROM, Strengthening, Supine, Right, 10 reps Straight Leg Raises: AROM, Supine, Strengthening, Right, 10 reps Long Arc Quad: AROM, Strengthening, Right, 10 reps, Seated Knee Flexion: AROM, Seated, Right, 10 reps Goniometric ROM: ~0 degrees full extension, ~85 degrees flexion   Assessment/Plan    PT Assessment Patient needs continued PT services  PT Problem List Decreased strength;Decreased coordination;Pain;Decreased range of motion;Decreased activity tolerance;Decreased balance;Decreased safety awareness;Decreased knowledge of use of DME;Decreased mobility;Decreased knowledge of precautions;Decreased skin integrity       PT Treatment Interventions DME instruction;Therapeutic exercise;Gait training;Balance training;Stair training;Neuromuscular re-education;Functional mobility  training;Therapeutic activities;Cognitive remediation;Patient/family education    PT Goals (Current goals can be found in the Care Plan section)  Acute Rehab PT Goals Patient Stated Goal: return to PLOF PT Goal Formulation: With patient Time For Goal Achievement: 02/02/23 Potential to Achieve Goals: Good    Frequency BID     Co-evaluation               AM-PAC PT "6 Clicks" Mobility  Outcome Measure Help needed turning from your back to your side while in a flat bed without using bedrails?: None Help needed moving from lying on your back to sitting on the side of a flat bed without using bedrails?: None Help needed moving to and from a bed to a chair (including a wheelchair)?: A Little Help needed standing up from a chair using your arms (e.g., wheelchair or bedside chair)?: A Little Help needed to walk in hospital room?: A Little Help needed climbing 3-5 steps with a railing? : A Little 6 Click Score: 20    End of Session   Activity Tolerance: Patient tolerated treatment well Patient left: in chair;with call bell/phone within reach;with family/visitor present Nurse Communication: Mobility status PT Visit Diagnosis: Pain;Muscle weakness (generalized) (M62.81);Other abnormalities of gait and mobility (R26.89);Difficulty in walking, not elsewhere classified (R26.2) Pain - Right/Left: Right Pain - part of body: Knee    Time: [id number] PT Time Calculation (min) (ACUTE ONLY): 26 min   Charges:  PT Evaluation $PT Eval Low Complexity: 1 Low PT Treatments $Therapeutic Exercise: 8-22 mins        Lavone Nian, PT, DPT 01/19/23, 4:25 PM   Waunita Schooner 01/19/2023, 4:23 PM

## 2023-01-19 NOTE — Interval H&P Note (Signed)
Patient history and physical updated. Consent reviewed including risks, benefits, and alternatives to surgery. Patient agrees with above plan to proceed with right total knee arthroplasty  

## 2023-01-19 NOTE — Plan of Care (Signed)

## 2023-01-19 NOTE — Anesthesia Procedure Notes (Signed)
Spinal  Patient location during procedure: OR Start time: 01/19/2023 7:33 AM End time: 01/19/2023 7:42 AM Reason for block: surgical anesthesia Staffing Performed: resident/CRNA  Resident/CRNA: Cammie Sickle, CRNA Performed by: Cammie Sickle, CRNA Authorized by: Ilene Qua, MD   Preanesthetic Checklist Completed: patient identified, IV checked, site marked, risks and benefits discussed, surgical consent, monitors and equipment checked, pre-op evaluation and timeout performed Spinal Block Patient position: sitting Prep: ChloraPrep Patient monitoring: heart rate, continuous pulse ox and blood pressure Approach: midline Location: L3-4 Injection technique: single-shot Needle Needle type: Introducer and Pencan  Needle gauge: 24 G Needle length: 10 cm Assessment Sensory level: T6 Events: CSF return Additional Notes Negative paresthesia. Negative blood return. Positive free-flowing CSF. Expiration date of kit checked and confirmed. Patient tolerated procedure well, without complications. Successful on first attempt, no complications noted. Pt. Tolerated procedure well without any pain/discomfort.

## 2023-01-20 DIAGNOSIS — I10 Essential (primary) hypertension: Secondary | ICD-10-CM | POA: Diagnosis not present

## 2023-01-20 DIAGNOSIS — M1711 Unilateral primary osteoarthritis, right knee: Secondary | ICD-10-CM | POA: Diagnosis not present

## 2023-01-20 DIAGNOSIS — Z96652 Presence of left artificial knee joint: Secondary | ICD-10-CM | POA: Diagnosis not present

## 2023-01-20 DIAGNOSIS — I2510 Atherosclerotic heart disease of native coronary artery without angina pectoris: Secondary | ICD-10-CM | POA: Diagnosis not present

## 2023-01-20 DIAGNOSIS — E1143 Type 2 diabetes mellitus with diabetic autonomic (poly)neuropathy: Secondary | ICD-10-CM | POA: Diagnosis not present

## 2023-01-20 DIAGNOSIS — E039 Hypothyroidism, unspecified: Secondary | ICD-10-CM | POA: Diagnosis not present

## 2023-01-20 LAB — BPAM RBC
Blood Product Expiration Date: 202404242359
Blood Product Expiration Date: 202404252359
Unit Type and Rh: 5100
Unit Type and Rh: 5100

## 2023-01-20 LAB — TYPE AND SCREEN
ABO/RH(D): O POS
Antibody Screen: POSITIVE
Unit division: 0
Unit division: 0

## 2023-01-20 LAB — CBC
HCT: 36.5 % — ABNORMAL LOW (ref 39.0–52.0)
Hemoglobin: 12.3 g/dL — ABNORMAL LOW (ref 13.0–17.0)
MCH: 31 pg (ref 26.0–34.0)
MCHC: 33.7 g/dL (ref 30.0–36.0)
MCV: 91.9 fL (ref 80.0–100.0)
Platelets: 160 10*3/uL (ref 150–400)
RBC: 3.97 MIL/uL — ABNORMAL LOW (ref 4.22–5.81)
RDW: 15 % (ref 11.5–15.5)
WBC: 7.6 10*3/uL (ref 4.0–10.5)
nRBC: 0 % (ref 0.0–0.2)

## 2023-01-20 LAB — BASIC METABOLIC PANEL
Anion gap: 11 (ref 5–15)
BUN: 25 mg/dL — ABNORMAL HIGH (ref 8–23)
CO2: 22 mmol/L (ref 22–32)
Calcium: 10 mg/dL (ref 8.9–10.3)
Chloride: 106 mmol/L (ref 98–111)
Creatinine, Ser: 0.88 mg/dL (ref 0.61–1.24)
GFR, Estimated: >60 mL/min (ref >60)
Glucose, Bld: 158 mg/dL — ABNORMAL HIGH (ref 70–99)
Potassium: 3.7 mmol/L (ref 3.5–5.1)
Sodium: 139 mmol/L (ref 135–145)

## 2023-01-20 LAB — GLUCOSE, CAPILLARY: Glucose-Capillary: 140 mg/dL — ABNORMAL HIGH (ref 70–99)

## 2023-01-20 MED ORDER — OXYCODONE HCL 5 MG PO TABS: 2.5000 mg | ORAL_TABLET | Freq: Four times a day (QID) | ORAL | 0 refills | Status: DC | PRN

## 2023-01-20 MED ORDER — TRAMADOL HCL 50 MG PO TABS: 50.0000 mg | ORAL_TABLET | Freq: Four times a day (QID) | ORAL | 0 refills | Status: DC | PRN

## 2023-01-20 MED ORDER — ONDANSETRON HCL 4 MG PO TABS: 4.0000 mg | ORAL_TABLET | Freq: Four times a day (QID) | ORAL | 0 refills | Status: DC | PRN

## 2023-01-20 MED ORDER — ACETAMINOPHEN 500 MG PO TABS: 1000.0000 mg | ORAL_TABLET | Freq: Three times a day (TID) | ORAL | 0 refills | Status: DC

## 2023-01-20 MED ORDER — ENOXAPARIN SODIUM 40 MG/0.4ML IJ SOSY: 40.0000 mg | PREFILLED_SYRINGE | INTRAMUSCULAR | 0 refills | Status: DC

## 2023-01-20 MED ORDER — DOCUSATE SODIUM 100 MG PO CAPS: 100.0000 mg | ORAL_CAPSULE | Freq: Two times a day (BID) | ORAL | 0 refills | Status: DC

## 2023-01-20 NOTE — Plan of Care (Signed)

## 2023-01-20 NOTE — Discharge Instructions (Signed)
Instructions after Total Knee Replacement   Zachary Martinez M.D.     Dept. of [facility]  [address] [place],   [address]  Phone: [phone]   Fax: [phone]    DIET: Drink plenty of non-alcoholic fluids. Resume your normal diet. Include foods high in fiber.  ACTIVITY:  You may use crutches or a walker with weight-bearing as tolerated, unless instructed otherwise. You may be weaned off of the walker or crutches by your Physical Therapist.  Do NOT place pillows under the knee. Anything placed under the knee could limit your ability to straighten the knee.   Continue doing gentle exercises. Exercising will reduce the pain and swelling, increase motion, and prevent muscle weakness.   Please continue to use the TED compression stockings for 2 weeks. You may remove the stockings at night, but should reapply them in the morning. Do not drive or operate any equipment until instructed.  WOUND CARE:  Continue to use the PolarCare or ice packs periodically to reduce pain and swelling. You may begin showering 3 days after surgery with honeycomb dressing. Remove honeycomb dressing 7 days after surgery and continue showering. Allow dermabond to fall off on its own.  MEDICATIONS: You may resume your regular medications. Please take the pain medication as prescribed on the medication. Do not take pain medication on an empty stomach. You have been given a prescription for a blood thinner (Lovenox or Coumadin). Please take the medication as instructed. (NOTE: After completing a 2 week course of Lovenox, take one 81 mg Enteric-coated aspirin twice a day for 3 additional weeks. This along with elevation will help reduce the possibility of phlebitis in your operated leg.) Do not drive or drink alcoholic beverages when taking pain medications.  POSTOPERATIVE CONSTIPATION PROTOCOL Constipation - defined medically as fewer than three stools per  week and severe constipation as less than one stool per week.  One of the most common issues patients have following surgery is constipation.  Even if you have a regular bowel pattern at home, your normal regimen is likely to be disrupted due to multiple reasons following surgery.  Combination of anesthesia, postoperative narcotics, change in appetite and fluid intake all can affect your bowels.  In order to avoid complications following surgery, here are some recommendations in order to help you during your recovery period.  Colace (docusate) - Pick up an over-the-counter form of Colace or another stool softener and take twice a day as long as you are requiring postoperative pain medications.  Take with a full glass of water daily.  If you experience loose stools or diarrhea, hold the colace until you stool forms back up.  If your symptoms do not get better within 1 week or if they get worse, check with your doctor.  Dulcolax (bisacodyl) - Pick up over-the-counter and take as directed by the product packaging as needed to assist with the movement of your bowels.  Take with a full glass of water.  Use this product as needed if not relieved by Colace only.   MiraLax (polyethylene glycol) - Pick up over-the-counter to have on hand.  MiraLax is a solution that will increase the amount of water in your bowels to assist with bowel movements.  Take as directed and can mix with a glass of water, juice, soda, coffee, or tea.  Take if you go more than two days without a movement. Do not use MiraLax more than once per day.  Call your doctor if you are still constipated or irregular after using this medication for 7 days in a row.  If you continue to have problems with postoperative constipation, please contact the office for further assistance and recommendations.  If you experience "the worst abdominal pain ever" or develop nausea or vomiting, please contact the office immediatly for further recommendations for  treatment.   CALL THE OFFICE FOR: Temperature above 101 degrees Excessive bleeding or drainage on the dressing. Excessive swelling, coldness, or paleness of the toes. Persistent nausea and vomiting.  FOLLOW-UP:  You should have an appointment to return to the office in 14 days after surgery. Arrangements have been made for continuation of Physical Therapy (either home therapy or outpatient therapy).

## 2023-01-20 NOTE — Anesthesia Postprocedure Evaluation (Signed)
Anesthesia Post Note  Patient: Zachary Martinez  Procedure(s) Performed: TOTAL KNEE ARTHROPLASTY (Right: Knee)  Patient location during evaluation: Nursing Unit Anesthesia Type: Spinal Level of consciousness: oriented and awake and alert Pain management: pain level controlled Vital Signs Assessment: post-procedure vital signs reviewed and stable Respiratory status: spontaneous breathing and respiratory function stable Cardiovascular status: blood pressure returned to baseline and stable Postop Assessment: no headache, no backache, no apparent nausea or vomiting and patient able to bend at knees Anesthetic complications: no   No notable events documented.   Last Vitals:  Vitals:   01/20/23 0416 01/20/23 0751  BP: (!) 145/66 (!) 158/62  Pulse: (!) 52 (!) 51  Resp: 20 16  Temp: 36.4 C 36.4 C  SpO2: 98% 96%    Last Pain:  Vitals:   01/20/23 0809  TempSrc:   PainSc: 0-No pain                 Lia Foyer

## 2023-01-20 NOTE — Plan of Care (Signed)
  Problem: Pain Management: Goal: Pain level will decrease with appropriate interventions Outcome: Progressing   Problem: Activity: Goal: Risk for activity intolerance will decrease Outcome: Progressing   Problem: Nutrition: Goal: Adequate nutrition will be maintained Outcome: Progressing   Problem: Pain Managment: Goal: General experience of comfort will improve Outcome: Progressing

## 2023-01-20 NOTE — TOC Initial Note (Signed)
Transition of Care [facility]) - Initial/Assessment Note    Patient Details  Name: Zachary Martinez MRN: [id number] Date of Birth: 1941-05-28  [facility] CM/SW Contact:    Beverly Sessions, RN Phone Number: 01/20/2023, 9:12 AM  Clinical Narrative:                  Patient already had home health services pre arranged with [place] with [facility] notified of discharge.  Per PT note already has RW.  And no further DME needs       Patient Goals and CMS Choice            Expected Discharge Plan and Services         Expected Discharge Date: 01/20/23                                    Prior Living Arrangements/Services                       Activities of Daily Living Home Assistive Devices/Equipment: Eyeglasses, CPAP ADL Screening (condition at time of admission) Patient's cognitive ability adequate to safely complete daily activities?: Yes Is the patient deaf or have difficulty hearing?: No Does the patient have difficulty seeing, even when wearing glasses/contacts?: No Does the patient have difficulty concentrating, remembering, or making decisions?: No Patient able to express need for assistance with ADLs?: Yes Does the patient have difficulty dressing or bathing?: No Independently performs ADLs?: Yes (appropriate for developmental age) Does the patient have difficulty walking or climbing stairs?: No Weakness of Legs: None Weakness of Arms/Hands: None  Permission Sought/Granted                  Emotional Assessment              Admission diagnosis:  S/P TKR (total knee replacement) using cement, right [Z96.651] Patient Active Problem List   Diagnosis Date Noted   S/P TKR (total knee replacement) using cement, right 01/19/2023   Left inguinal hernia 09/08/2022   S/P TKR (total knee replacement) using cement, left 06/08/2021   Bilateral carotid artery disease ([place]) 01/28/2021   Bradycardia  10/30/2018   Venous insufficiency of both lower extremities 06/02/2017   Hypertension 06/07/2016   Type 2 diabetes mellitus ([place]) 10/08/2015   Abnormal chest x-ray 05/09/2015   CAD in native artery 05/09/2015   Arteriosclerosis of coronary artery 05/09/2015   Diabetes mellitus with polyneuropathy ([place]) 05/09/2015   Diverticulosis of colon 05/09/2015   ED (erectile dysfunction) of organic origin 05/09/2015   Family history of colon cancer 05/09/2015   HLD (hyperlipidemia) 05/09/2015   Adult hypothyroidism 05/09/2015   Adiposity 05/09/2015   Arthritis, degenerative 05/09/2015   Parathyroid adenoma 05/09/2015   Apnea, sleep 05/09/2015   Disorder of bursae and tendons in shoulder region 12/22/2014   Hypertensive left ventricular hypertrophy 11/24/2014   MI (mitral incompetence) 11/24/2014   Obstructive apnea 11/24/2014   PCP:  Eulas Post, MD Pharmacy:   [facility] [place] [facility] [address] [place] [address] Phone: [phone] Fax: [phone]  [facility] [place], [place] [address] Phone: [phone] Fax: [place] [address] Phone: [phone] Fax:  [phone]     Social Determinants of Health (SDOH) Social History: SDOH Screenings   Food Insecurity: No Food Insecurity (01/19/2023)  Housing: Low Risk  (01/19/2023)  Transportation Needs: No Transportation Needs (01/19/2023)  Utilities: Not At Risk (01/19/2023)  Alcohol Screen: Low Risk  (01/05/2022)  Depression (PHQ2-9): Low Risk  (01/05/2022)  Financial Resource Strain: Low Risk  (01/05/2022)  Physical Activity: Insufficiently Active (01/05/2022)  Social Connections: Moderately Integrated (01/05/2022)  Stress: No Stress Concern Present (01/05/2022)  Tobacco Use: Medium Risk (01/19/2023)   SDOH Interventions: Housing  Interventions: Intervention Not Indicated   Readmission Risk Interventions     No data to display

## 2023-01-20 NOTE — Progress Notes (Addendum)
Subjective: 1 Day Post-Op Procedure(s) (LRB): TOTAL KNEE ARTHROPLASTY (Right) Patient reports pain as mild.   Patient is well, and has had no acute complaints or problems Denies any CP, SOB, ABD pain. We will continue therapy today.  Plan is to go Home after hospital stay.  Objective: Vital signs in last 24 hours: Temp:  [97.1 F (36.2 C)-98.3 F (36.8 C)] 97.6 F (36.4 C) (03/22 0751) Pulse Rate:  [44-87] 51 (03/22 0751) Resp:  [11-20] 16 (03/22 0751) BP: (118-183)/(57-80) 158/62 (03/22 0751) SpO2:  [90 %-99 %] 96 % (03/22 0751)  Intake/Output from previous day: 03/21 0701 - 03/22 0700 In: 1517.7 [P.O.:420; I.V.:600; IV Piggyback:497.7] Out: 3125 [Urine:3050; Blood:75] Intake/Output this shift: No intake/output data recorded.  Recent Labs    01/20/23 0438  HGB 12.3*   Recent Labs    01/20/23 0438  WBC 7.6  RBC 3.97*  HCT 36.5*  PLT 160   Recent Labs    01/20/23 0438  NA 139  K 3.7  CL 106  CO2 22  BUN 25*  CREATININE 0.88  GLUCOSE 158*  CALCIUM 10.0   No results for input(s): "LABPT", "INR" in the last 72 hours.  EXAM General - Patient is Alert, Appropriate, and Oriented Extremity - Neurovascular intact Sensation intact distally Intact pulses distally Dorsiflexion/Plantar flexion intact No cellulitis present Compartment soft Dressing - dressing C/D/I and no drainage Motor Function - intact, moving foot and toes well on exam.   Past Medical History:  Diagnosis Date   Angiomyolipoma of right kidney    Arthritis of both knees    Bilateral carotid artery disease (HCC)    Bradycardia    CAD (coronary artery disease) 02/13/2013   a.) LHC 02/13/2013: 30% dLM, 40% oLAD, 50% pLAD, 40% D1, 30% pLCx, 60% mLCx-1, 20% mLCx-2, 60% OM1, 20% pRCA, 70% RPDA -- med mgmt; b.) MPI 06/29/2020: EF 50%, no ishemia/scar.   Cataract    a.) s/p extraction with IOL placement on RIGHT; b.) forming in LEFT eye   Diabetic polyneuropathy (HCC)    Diastolic dysfunction     a.) TTE 07/04/2017: EF 50%, mod LVH, mild MAC, mod BAE, mod RVE, mild PR, mod MR/TR, G1DD.   Diverticulosis    GERD (gastroesophageal reflux disease)    Hepatic steatosis    Hiatal hernia    Hyperlipidemia    Hypertension    Hypothyroidism    Left inguinal hernia    Lumbar spondylosis    Myelolipoma of right adrenal gland 04/05/2020   a.) CT abd 04/06/2011: measures 2.9 cm; b.) CT A/P 08/12/2022: measured 5.7 cm   Nephrolithiasis    OSA on CPAP    Skin cancer    chest and ears   Splenomegaly    Spondylolisthesis at L5-S1 level    T2DM (type 2 diabetes mellitus) ([place])    Valvular heart disease    a.) TTE 12/01/2014: mod MR/TR, sev PR; b.) TTE 07/04/2017: mild PR, mod MR/TR; c.) TTE 06/29/2020: mild MR/TR/PR   Venous insufficiency of both lower extremities     Assessment/Plan:   1 Day Post-Op Procedure(s) (LRB): TOTAL KNEE ARTHROPLASTY (Right) Principal Problem:   S/P TKR (total knee replacement) using cement, right  Estimated body mass index is 37.05 kg/m as calculated from the following:   Height as of this encounter: 5\' 9"  (1.753 m).   Weight as of this encounter: 113.8 kg. Advance diet Up with therapy Pain well-controlled Labs and vital signs are stable Care management to assist with discharge to  home with home health PT today pending safe completion of PT goals.   Patient is not able to walk the distance required to go the bathroom, or he/she is unable to safely negotiate stairs required to access the bathroom.  A 3in1 BSC will alleviate this problem.       DVT Prophylaxis - Lovenox, TED hose, and SCDs Weight-Bearing as tolerated to right leg   T. Rachelle Hora, PA-C [place] 01/20/2023, 8:53 AM   Patient seen and examined, agree with above plan.  The patient is doing well status post right total knee arthroplasty, no concerns at this time.  Pain is controlled.  Discussed DVT prophylaxis, pain medication use, and safe transition to home.   All questions answered the patient agrees with above plan will go  home after clears PT.   Steffanie Rainwater MD

## 2023-01-20 NOTE — Discharge Summary (Signed)
Physician Discharge Summary  Patient ID: Zachary Martinez MRN: [id number] DOB/AGE: 1940/11/22 82 y.o.  Admit date: 01/19/2023 Discharge date: 01/20/2023  Admission Diagnoses:  S/P TKR (total knee replacement) using cement, right [Z96.651]   Discharge Diagnoses: Patient Active Problem List   Diagnosis Date Noted   S/P TKR (total knee replacement) using cement, right 01/19/2023   Left inguinal hernia 09/08/2022   S/P TKR (total knee replacement) using cement, left 06/08/2021   Bilateral carotid artery disease ([place]) 01/28/2021   Bradycardia 10/30/2018   Venous insufficiency of both lower extremities 06/02/2017   Hypertension 06/07/2016   Type 2 diabetes mellitus ([place]) 10/08/2015   Abnormal chest x-ray 05/09/2015   CAD in native artery 05/09/2015   Arteriosclerosis of coronary artery 05/09/2015   Diabetes mellitus with polyneuropathy ([place]) 05/09/2015   Diverticulosis of colon 05/09/2015   ED (erectile dysfunction) of organic origin 05/09/2015   Family history of colon cancer 05/09/2015   HLD (hyperlipidemia) 05/09/2015   Adult hypothyroidism 05/09/2015   Adiposity 05/09/2015   Arthritis, degenerative 05/09/2015   Parathyroid adenoma 05/09/2015   Apnea, sleep 05/09/2015   Disorder of bursae and tendons in shoulder region 12/22/2014   Hypertensive left ventricular hypertrophy 11/24/2014   MI (mitral incompetence) 11/24/2014   Obstructive apnea 11/24/2014    Past Medical History:  Diagnosis Date   Angiomyolipoma of right kidney    Arthritis of both knees    Bilateral carotid artery disease (HCC)    Bradycardia    CAD (coronary artery disease) 02/13/2013   a.) LHC 02/13/2013: 30% dLM, 40% oLAD, 50% pLAD, 40% D1, 30% pLCx, 60% mLCx-1, 20% mLCx-2, 60% OM1, 20% pRCA, 70% RPDA -- med mgmt; b.) MPI 06/29/2020: EF 50%, no ishemia/scar.   Cataract    a.) s/p extraction with IOL placement on RIGHT; b.) forming in LEFT eye   Diabetic polyneuropathy (HCC)    Diastolic dysfunction    a.)  TTE 07/04/2017: EF 50%, mod LVH, mild MAC, mod BAE, mod RVE, mild PR, mod MR/TR, G1DD.   Diverticulosis    GERD (gastroesophageal reflux disease)    Hepatic steatosis    Hiatal hernia    Hyperlipidemia    Hypertension    Hypothyroidism    Left inguinal hernia    Lumbar spondylosis    Myelolipoma of right adrenal gland 04/05/2020   a.) CT abd 04/06/2011: measures 2.9 cm; b.) CT A/P 08/12/2022: measured 5.7 cm   Nephrolithiasis    OSA on CPAP    Skin cancer    chest and ears   Splenomegaly    Spondylolisthesis at L5-S1 level    T2DM (type 2 diabetes mellitus) ([place])    Valvular heart disease    a.) TTE 12/01/2014: mod MR/TR, sev PR; b.) TTE 07/04/2017: mild PR, mod MR/TR; c.) TTE 06/29/2020: mild MR/TR/PR   Venous insufficiency of both lower extremities      Transfusion: none   Consultants (if any):   Discharged Condition: Improved  Hospital Course: Zachary Martinez is an 82 y.o. male who was admitted 01/19/2023 with a diagnosis of S/P TKR (total knee replacement) using cement, right and went to the operating room on 01/19/2023 and underwent the above named procedures.    Surgeries: Procedure(s): TOTAL KNEE ARTHROPLASTY on 01/19/2023 Patient tolerated the surgery well. Taken to PACU where she was stabilized and then transferred to the orthopedic floor.  Started on Lovenox 30 mg q 24 hrs. TEDs and SCDs applied bilaterally. Heels elevated on bed. No evidence of DVT. Negative [place].  Physical therapy started on day #1 for gait training and transfer. OT started day #1 for ADL and assisted devices. Patient's IV was d/c on day #1. Patient was able to safely and independently complete all PT goals. PT recommending discharge to home. On post op day #1 patient was stable and ready for discharge to home with HHPT.  Implants:  Femur: Persona Size 11 CR   Tibia: Persona Size H w/ 14x64mm stem extension  Poly: 55mm MC  Patella: 35x6mm symmetric cemented   He was given perioperative antibiotics:   Anti-infectives (From admission, onward)    Start     Dose/Rate Route Frequency Ordered Stop   01/19/23 1330  ceFAZolin (ANCEF) IVPB 2g/100 mL premix        2 g 200 mL/hr over 30 Minutes Intravenous Every 6 hours 01/19/23 1112 01/19/23 2216   01/19/23 1053  ceFAZolin (ANCEF) 2-4 GM/100ML-% IVPB       Note to Pharmacy: Jeanene Erb E: cabinet override      01/19/23 1053 01/19/23 2259   01/19/23 0615  ceFAZolin (ANCEF) IVPB 2g/100 mL premix        2 g 200 mL/hr over 30 Minutes Intravenous On call to O.R. 01/19/23 0601 01/19/23 0805   01/19/23 0604  ceFAZolin (ANCEF) 2-4 GM/100ML-% IVPB       Note to Pharmacy: [facility] C: cabinet override      01/19/23 0604 01/19/23 0756     .  He was given sequential compression devices, early ambulation, and Lovneox TEDs for DVT prophylaxis.  He benefited maximally from the hospital stay and there were no complications.    Recent vital signs:  Vitals:   01/20/23 0416 01/20/23 0751  BP: (!) 145/66 (!) 158/62  Pulse: (!) 52 (!) 51  Resp: 20 16  Temp: 97.6 F (36.4 C) 97.6 F (36.4 C)  SpO2: 98% 96%    Recent laboratory studies:  Lab Results  Component Value Date   HGB 12.3 (L) 01/20/2023   HGB 13.4 08/10/2022   HGB 14.2 01/24/2022   Lab Results  Component Value Date   WBC 7.6 01/20/2023   PLT 160 01/20/2023   No results found for: "INR" Lab Results  Component Value Date   NA 139 01/20/2023   K 3.7 01/20/2023   CL 106 01/20/2023   CO2 22 01/20/2023   BUN 25 (H) 01/20/2023   CREATININE 0.88 01/20/2023   GLUCOSE 158 (H) 01/20/2023    Discharge Medications:   Allergies as of 01/20/2023       Reactions   Ace Inhibitors Anaphylaxis, Other (See Comments)   Swelling of tongue        Medication List     STOP taking these medications    ibuprofen 800 MG tablet Commonly known as: ADVIL       TAKE these medications    acetaminophen 500 MG tablet Commonly known as: TYLENOL Take 2 tablets (1,000 mg total)  by mouth every 8 (eight) hours.   alfuzosin 10 MG 24 hr tablet Commonly known as: UROXATRAL Take 10 mg by mouth daily with breakfast.   amLODipine 10 MG tablet Commonly known as: NORVASC Take 1 tablet (10 mg total) by mouth daily.   atorvastatin 40 MG tablet Commonly known as: LIPITOR TAKE 1 TABLET EVERY DAY What changed: when to take this   carvedilol 6.25 MG tablet Commonly known as: COREG Take 1 tablet (6.25 mg total) by mouth 2 (two) times daily with a meal.   docusate sodium  100 MG capsule Commonly known as: COLACE Take 1 capsule (100 mg total) by mouth 2 (two) times daily.   enoxaparin 40 MG/0.4ML injection Commonly known as: LOVENOX Inject 0.4 mLs (40 mg total) into the skin daily for 14 days.   EPINEPHrine 0.3 mg/0.3 mL Soaj injection Commonly known as: EPI-PEN Inject 0.3 mg into the muscle as needed for anaphylaxis.   hydrALAZINE 100 MG tablet Commonly known as: APRESOLINE Take 1 tablet (100 mg total) by mouth 2 (two) times daily.   Invokamet [id number] MG Tabs Generic drug: Canagliflozin-metFORMIN HCl Take 1 tablet by mouth 2 (two) times daily.   levothyroxine 88 MCG tablet Commonly known as: SYNTHROID TAKE 1 TABLET EVERY DAY BEFORE BREAKFAST What changed: See the new instructions.   ondansetron 4 MG tablet Commonly known as: ZOFRAN Take 1 tablet (4 mg total) by mouth every 6 (six) hours as needed for nausea.   oxyCODONE 5 MG immediate release tablet Commonly known as: Roxicodone Take 0.5 tablets (2.5 mg total) by mouth every 6 (six) hours as needed for severe pain or breakthrough pain.   pioglitazone 30 MG tablet Commonly known as: ACTOS Take 1 tablet (30 mg total) by mouth every morning.   traMADol 50 MG tablet Commonly known as: ULTRAM Take 1 tablet (50 mg total) by mouth every 6 (six) hours as needed for moderate pain.        Diagnostic Studies: DG Knee Right Port  Result Date: 01/19/2023 CLINICAL DATA:  Postop EXAM: PORTABLE RIGHT KNEE -  2 VIEW COMPARISON:  None Available. FINDINGS: Two views of the right knee demonstrates total knee arthroplasty. Cemented components and patellar button. Expected alignment. Soft tissue gas noted including along the joint space. Osteopenia. Vascular calcifications. Imaging was obtained to aid in treatment. IMPRESSION: Acute surgical changes of total knee arthroplasty Electronically Signed   By: Jill Side M.D.   On: 01/19/2023 10:42    Disposition:      Follow-up Information     Duanne Guess, PA-C Follow up in 2 week(s).   Specialties: Orthopedic Surgery, Emergency Medicine Contact information: [place] [address] [phone]                  Signed: Feliberto Gottron 01/20/2023, 8:57 AM

## 2023-01-20 NOTE — Progress Notes (Signed)
Physical Therapy Treatment Patient Details Name: Zachary Martinez MRN: [id number] DOB: [birth year] Today's Date: 01/20/2023   History of Present Illness Pt is an 82 y/o M admitted on 01/19/23 for scheduled R TKA. PMH: CAD, DM, HLD, HTN, sleep apnea, L TKA (2022)    PT Comments    Pt received upright in bed agreeable to PT. Reports moderate pain using bone foam. Exits bed to L side mod-I standing and ambulating at RW at supervision level with safe hand placement to transfer.  Pt completing > 300' of gait with RW with consistent RLE heel strike and step through gait. Pt completes steps safely similar to home set up with correct sequencing. Educated pt on how family can assist with RW management. Pt understanding, returning to room performing HEP packet as listed below. Educated on car transfer with no further questions or concerns. Pt with all needs in reach with R knee extended. Pt safe to d/c home with recommended f/u services.    Recommendations for follow up therapy are one component of a multi-disciplinary discharge planning process, led by the attending physician.  Recommendations may be updated based on patient status, additional functional criteria and insurance authorization.  Follow Up Recommendations  Follow physician's recommendations for discharge plan and follow up therapies     Assistance Recommended at Discharge Intermittent Supervision/Assistance  Patient can return home with the following A little help with walking and/or transfers;A little help with bathing/dressing/bathroom;Assistance with cooking/housework;Help with stairs or ramp for entrance;Assist for transportation   Equipment Recommendations  None recommended by PT    Recommendations for Other Services       Precautions / Restrictions Precautions Precautions: Fall;Knee Precaution Booklet Issued: Yes (comment) Restrictions Weight Bearing Restrictions: Yes RLE Weight Bearing: Weight bearing as tolerated      Mobility  Bed Mobility Overal bed mobility: Modified Independent             General bed mobility comments: supine>sit with HOB elevated Patient Response: Cooperative  Transfers Overall transfer level: Needs assistance Equipment used: Rolling walker (2 wheels) Transfers: Sit to/from Stand Sit to Stand: Min guard           General transfer comment: Increased time, performs with safe hand placement    Ambulation/Gait Ambulation/Gait assistance: Supervision Gait Distance (Feet): 348 Feet Assistive device: Rolling walker (2 wheels) Gait Pattern/deviations: Decreased step length - left, Decreased step length - right, Decreased stride length       General Gait Details: with VC's pt able to perform step through gait with consistent heel strike on RLE   Stairs Stairs: Yes Stairs assistance: Supervision Stair Management: One rail Right, Step to pattern, Forwards Number of Stairs: 4 General stair comments: asc/desc safely with correct sequencing of LE's. Reviewed with pt how to utilize family to assist with RW management. Completes safely without balance concerns.   Wheelchair Mobility    Modified Rankin (Stroke Patients Only)       Balance Overall balance assessment: Needs assistance Sitting-balance support: Feet supported Sitting balance-Leahy Scale: Good     Standing balance support: During functional activity, Bilateral upper extremity supported Standing balance-Leahy Scale: Good                              Cognition Arousal/Alertness: Awake/alert Behavior During Therapy: WFL for tasks assessed/performed Overall Cognitive Status: Within Functional Limits for tasks assessed  Exercises Total Joint Exercises Ankle Circles/Pumps: AROM, Seated, Both, 10 reps, Strengthening Quad Sets: AROM, Supine, Right, Strengthening, 10 reps Short Arc Quad: AROM, Supine, Right, Strengthening, 10  reps Heel Slides: Supine, AROM, Strengthening, Right, 10 reps Hip ABduction/ADduction: AROM, Strengthening, Supine, Right, 10 reps Straight Leg Raises: AROM, Supine, Strengthening, Right, 10 reps Long Arc Quad: AROM, Strengthening, Right, 10 reps, Seated Knee Flexion: AROM, Seated, Right, 10 reps Other Exercises Other Exercises: educated on HEP (reps/sets/frequency), car transfer    General Comments        Pertinent Vitals/Pain Pain Assessment Pain Assessment: 0-10 Pain Score: 6  Pain Location: R knee Pain Descriptors / Indicators: Discomfort Pain Intervention(s): Limited activity within patient's tolerance, Monitored during session, Repositioned, Ice applied, RN gave pain meds during session    Home Living                          Prior Function            PT Goals (current goals can now be found in the care plan section) Acute Rehab PT Goals Patient Stated Goal: return to PLOF PT Goal Formulation: With patient Time For Goal Achievement: 02/02/23 Potential to Achieve Goals: Good Progress towards PT goals: Progressing toward goals    Frequency    BID      PT Plan Current plan remains appropriate    Co-evaluation              AM-PAC PT "6 Clicks" Mobility   Outcome Measure  Help needed turning from your back to your side while in a flat bed without using bedrails?: None Help needed moving from lying on your back to sitting on the side of a flat bed without using bedrails?: None Help needed moving to and from a bed to a chair (including a wheelchair)?: A Little Help needed standing up from a chair using your arms (e.g., wheelchair or bedside chair)?: A Little Help needed to walk in hospital room?: A Little Help needed climbing 3-5 steps with a railing? : A Little 6 Click Score: 20    End of Session Equipment Utilized During Treatment: Gait belt Activity Tolerance: Patient tolerated treatment well Patient left: in chair;with call bell/phone  within reach;with family/visitor present;with chair alarm set Nurse Communication: Mobility status PT Visit Diagnosis: Pain;Muscle weakness (generalized) (M62.81);Other abnormalities of gait and mobility (R26.89);Difficulty in walking, not elsewhere classified (R26.2) Pain - Right/Left: Right Pain - part of body: Knee     Time: [id number] PT Time Calculation (min) (ACUTE ONLY): 31 min  Charges:  $Gait Training: 8-22 mins $Therapeutic Exercise: 8-22 mins                     Jamiyah Dingley M. Fairly IV, PT, DPT Physical Therapist- [facility]  01/20/2023, 10:06 AM

## 2023-01-20 NOTE — Progress Notes (Signed)
DISCHARGE NOTE:  Pt given discharge instructions and verbalized understanding. Pt wheeled to car by staff, family providing transportation home.

## 2023-01-21 DIAGNOSIS — Z79891 Long term (current) use of opiate analgesic: Secondary | ICD-10-CM | POA: Diagnosis not present

## 2023-01-21 DIAGNOSIS — K219 Gastro-esophageal reflux disease without esophagitis: Secondary | ICD-10-CM | POA: Diagnosis not present

## 2023-01-21 DIAGNOSIS — Z7901 Long term (current) use of anticoagulants: Secondary | ICD-10-CM | POA: Diagnosis not present

## 2023-01-21 DIAGNOSIS — G4733 Obstructive sleep apnea (adult) (pediatric): Secondary | ICD-10-CM | POA: Diagnosis not present

## 2023-01-21 DIAGNOSIS — M4317 Spondylolisthesis, lumbosacral region: Secondary | ICD-10-CM | POA: Diagnosis not present

## 2023-01-21 DIAGNOSIS — K760 Fatty (change of) liver, not elsewhere classified: Secondary | ICD-10-CM | POA: Diagnosis not present

## 2023-01-21 DIAGNOSIS — Z96651 Presence of right artificial knee joint: Secondary | ICD-10-CM | POA: Diagnosis not present

## 2023-01-21 DIAGNOSIS — K5730 Diverticulosis of large intestine without perforation or abscess without bleeding: Secondary | ICD-10-CM | POA: Diagnosis not present

## 2023-01-21 DIAGNOSIS — M47816 Spondylosis without myelopathy or radiculopathy, lumbar region: Secondary | ICD-10-CM | POA: Diagnosis not present

## 2023-01-21 DIAGNOSIS — M1990 Unspecified osteoarthritis, unspecified site: Secondary | ICD-10-CM | POA: Diagnosis not present

## 2023-01-21 DIAGNOSIS — I2510 Atherosclerotic heart disease of native coronary artery without angina pectoris: Secondary | ICD-10-CM | POA: Diagnosis not present

## 2023-01-21 DIAGNOSIS — Z471 Aftercare following joint replacement surgery: Secondary | ICD-10-CM | POA: Diagnosis not present

## 2023-01-21 DIAGNOSIS — E669 Obesity, unspecified: Secondary | ICD-10-CM | POA: Diagnosis not present

## 2023-01-21 DIAGNOSIS — I872 Venous insufficiency (chronic) (peripheral): Secondary | ICD-10-CM | POA: Diagnosis not present

## 2023-01-21 DIAGNOSIS — E1142 Type 2 diabetes mellitus with diabetic polyneuropathy: Secondary | ICD-10-CM | POA: Diagnosis not present

## 2023-01-21 DIAGNOSIS — E785 Hyperlipidemia, unspecified: Secondary | ICD-10-CM | POA: Diagnosis not present

## 2023-01-21 DIAGNOSIS — K4090 Unilateral inguinal hernia, without obstruction or gangrene, not specified as recurrent: Secondary | ICD-10-CM | POA: Diagnosis not present

## 2023-01-21 DIAGNOSIS — E1151 Type 2 diabetes mellitus with diabetic peripheral angiopathy without gangrene: Secondary | ICD-10-CM | POA: Diagnosis not present

## 2023-01-21 DIAGNOSIS — N529 Male erectile dysfunction, unspecified: Secondary | ICD-10-CM | POA: Diagnosis not present

## 2023-01-21 DIAGNOSIS — I6523 Occlusion and stenosis of bilateral carotid arteries: Secondary | ICD-10-CM | POA: Diagnosis not present

## 2023-01-21 DIAGNOSIS — I059 Rheumatic mitral valve disease, unspecified: Secondary | ICD-10-CM | POA: Diagnosis not present

## 2023-01-21 DIAGNOSIS — Z6837 Body mass index (BMI) 37.0-37.9, adult: Secondary | ICD-10-CM | POA: Diagnosis not present

## 2023-01-21 DIAGNOSIS — I119 Hypertensive heart disease without heart failure: Secondary | ICD-10-CM | POA: Diagnosis not present

## 2023-01-21 DIAGNOSIS — E039 Hypothyroidism, unspecified: Secondary | ICD-10-CM | POA: Diagnosis not present

## 2023-01-21 DIAGNOSIS — D351 Benign neoplasm of parathyroid gland: Secondary | ICD-10-CM | POA: Diagnosis not present

## 2023-01-23 ENCOUNTER — Encounter: Payer: Self-pay | Admitting: Orthopedic Surgery

## 2023-01-23 DIAGNOSIS — I872 Venous insufficiency (chronic) (peripheral): Secondary | ICD-10-CM | POA: Diagnosis not present

## 2023-01-23 DIAGNOSIS — I119 Hypertensive heart disease without heart failure: Secondary | ICD-10-CM | POA: Diagnosis not present

## 2023-01-23 DIAGNOSIS — Z471 Aftercare following joint replacement surgery: Secondary | ICD-10-CM | POA: Diagnosis not present

## 2023-01-23 DIAGNOSIS — E1142 Type 2 diabetes mellitus with diabetic polyneuropathy: Secondary | ICD-10-CM | POA: Diagnosis not present

## 2023-01-23 DIAGNOSIS — E039 Hypothyroidism, unspecified: Secondary | ICD-10-CM | POA: Diagnosis not present

## 2023-01-23 DIAGNOSIS — E1151 Type 2 diabetes mellitus with diabetic peripheral angiopathy without gangrene: Secondary | ICD-10-CM | POA: Diagnosis not present

## 2023-01-24 DIAGNOSIS — Z471 Aftercare following joint replacement surgery: Secondary | ICD-10-CM | POA: Diagnosis not present

## 2023-01-24 DIAGNOSIS — I872 Venous insufficiency (chronic) (peripheral): Secondary | ICD-10-CM | POA: Diagnosis not present

## 2023-01-24 DIAGNOSIS — I119 Hypertensive heart disease without heart failure: Secondary | ICD-10-CM | POA: Diagnosis not present

## 2023-01-24 DIAGNOSIS — E039 Hypothyroidism, unspecified: Secondary | ICD-10-CM | POA: Diagnosis not present

## 2023-01-24 DIAGNOSIS — E1142 Type 2 diabetes mellitus with diabetic polyneuropathy: Secondary | ICD-10-CM | POA: Diagnosis not present

## 2023-01-24 DIAGNOSIS — E1151 Type 2 diabetes mellitus with diabetic peripheral angiopathy without gangrene: Secondary | ICD-10-CM | POA: Diagnosis not present

## 2023-01-26 DIAGNOSIS — E1151 Type 2 diabetes mellitus with diabetic peripheral angiopathy without gangrene: Secondary | ICD-10-CM | POA: Diagnosis not present

## 2023-01-26 DIAGNOSIS — E039 Hypothyroidism, unspecified: Secondary | ICD-10-CM | POA: Diagnosis not present

## 2023-01-26 DIAGNOSIS — E1142 Type 2 diabetes mellitus with diabetic polyneuropathy: Secondary | ICD-10-CM | POA: Diagnosis not present

## 2023-01-26 DIAGNOSIS — I872 Venous insufficiency (chronic) (peripheral): Secondary | ICD-10-CM | POA: Diagnosis not present

## 2023-01-26 DIAGNOSIS — I119 Hypertensive heart disease without heart failure: Secondary | ICD-10-CM | POA: Diagnosis not present

## 2023-01-26 DIAGNOSIS — Z471 Aftercare following joint replacement surgery: Secondary | ICD-10-CM | POA: Diagnosis not present

## 2023-01-30 ENCOUNTER — Encounter: Payer: Self-pay | Admitting: Orthopedic Surgery

## 2023-01-31 DIAGNOSIS — E1151 Type 2 diabetes mellitus with diabetic peripheral angiopathy without gangrene: Secondary | ICD-10-CM | POA: Diagnosis not present

## 2023-01-31 DIAGNOSIS — Z471 Aftercare following joint replacement surgery: Secondary | ICD-10-CM | POA: Diagnosis not present

## 2023-01-31 DIAGNOSIS — E1142 Type 2 diabetes mellitus with diabetic polyneuropathy: Secondary | ICD-10-CM | POA: Diagnosis not present

## 2023-01-31 DIAGNOSIS — I119 Hypertensive heart disease without heart failure: Secondary | ICD-10-CM | POA: Diagnosis not present

## 2023-01-31 DIAGNOSIS — I872 Venous insufficiency (chronic) (peripheral): Secondary | ICD-10-CM | POA: Diagnosis not present

## 2023-01-31 DIAGNOSIS — E039 Hypothyroidism, unspecified: Secondary | ICD-10-CM | POA: Diagnosis not present

## 2023-02-02 DIAGNOSIS — E039 Hypothyroidism, unspecified: Secondary | ICD-10-CM | POA: Diagnosis not present

## 2023-02-02 DIAGNOSIS — Z471 Aftercare following joint replacement surgery: Secondary | ICD-10-CM | POA: Diagnosis not present

## 2023-02-02 DIAGNOSIS — E1151 Type 2 diabetes mellitus with diabetic peripheral angiopathy without gangrene: Secondary | ICD-10-CM | POA: Diagnosis not present

## 2023-02-02 DIAGNOSIS — E1142 Type 2 diabetes mellitus with diabetic polyneuropathy: Secondary | ICD-10-CM | POA: Diagnosis not present

## 2023-02-02 DIAGNOSIS — I872 Venous insufficiency (chronic) (peripheral): Secondary | ICD-10-CM | POA: Diagnosis not present

## 2023-02-02 DIAGNOSIS — I119 Hypertensive heart disease without heart failure: Secondary | ICD-10-CM | POA: Diagnosis not present

## 2023-02-06 DIAGNOSIS — I119 Hypertensive heart disease without heart failure: Secondary | ICD-10-CM | POA: Diagnosis not present

## 2023-02-06 DIAGNOSIS — E1142 Type 2 diabetes mellitus with diabetic polyneuropathy: Secondary | ICD-10-CM | POA: Diagnosis not present

## 2023-02-06 DIAGNOSIS — E039 Hypothyroidism, unspecified: Secondary | ICD-10-CM | POA: Diagnosis not present

## 2023-02-06 DIAGNOSIS — I872 Venous insufficiency (chronic) (peripheral): Secondary | ICD-10-CM | POA: Diagnosis not present

## 2023-02-06 DIAGNOSIS — Z471 Aftercare following joint replacement surgery: Secondary | ICD-10-CM | POA: Diagnosis not present

## 2023-02-06 DIAGNOSIS — E1151 Type 2 diabetes mellitus with diabetic peripheral angiopathy without gangrene: Secondary | ICD-10-CM | POA: Diagnosis not present

## 2023-02-07 DIAGNOSIS — M25561 Pain in right knee: Secondary | ICD-10-CM | POA: Diagnosis not present

## 2023-02-07 DIAGNOSIS — Z96651 Presence of right artificial knee joint: Secondary | ICD-10-CM | POA: Diagnosis not present

## 2023-02-10 DIAGNOSIS — M25561 Pain in right knee: Secondary | ICD-10-CM | POA: Diagnosis not present

## 2023-02-10 DIAGNOSIS — Z96651 Presence of right artificial knee joint: Secondary | ICD-10-CM | POA: Diagnosis not present

## 2023-02-15 DIAGNOSIS — M25561 Pain in right knee: Secondary | ICD-10-CM | POA: Diagnosis not present

## 2023-02-15 DIAGNOSIS — Z96651 Presence of right artificial knee joint: Secondary | ICD-10-CM | POA: Diagnosis not present

## 2023-02-17 DIAGNOSIS — M25561 Pain in right knee: Secondary | ICD-10-CM | POA: Diagnosis not present

## 2023-02-17 DIAGNOSIS — Z96651 Presence of right artificial knee joint: Secondary | ICD-10-CM | POA: Diagnosis not present

## 2023-02-18 DIAGNOSIS — S41112A Laceration without foreign body of left upper arm, initial encounter: Secondary | ICD-10-CM | POA: Diagnosis not present

## 2023-02-21 DIAGNOSIS — M25561 Pain in right knee: Secondary | ICD-10-CM | POA: Diagnosis not present

## 2023-02-21 DIAGNOSIS — Z96651 Presence of right artificial knee joint: Secondary | ICD-10-CM | POA: Diagnosis not present

## 2023-02-23 DIAGNOSIS — G8929 Other chronic pain: Secondary | ICD-10-CM | POA: Diagnosis not present

## 2023-02-23 DIAGNOSIS — R531 Weakness: Secondary | ICD-10-CM | POA: Diagnosis not present

## 2023-02-23 DIAGNOSIS — M25661 Stiffness of right knee, not elsewhere classified: Secondary | ICD-10-CM | POA: Diagnosis not present

## 2023-02-23 DIAGNOSIS — Z96651 Presence of right artificial knee joint: Secondary | ICD-10-CM | POA: Diagnosis not present

## 2023-02-23 DIAGNOSIS — M25561 Pain in right knee: Secondary | ICD-10-CM | POA: Diagnosis not present

## 2023-02-28 DIAGNOSIS — Z96651 Presence of right artificial knee joint: Secondary | ICD-10-CM | POA: Diagnosis not present

## 2023-02-28 DIAGNOSIS — M25561 Pain in right knee: Secondary | ICD-10-CM | POA: Diagnosis not present

## 2023-02-28 DIAGNOSIS — M25661 Stiffness of right knee, not elsewhere classified: Secondary | ICD-10-CM | POA: Diagnosis not present

## 2023-02-28 DIAGNOSIS — R531 Weakness: Secondary | ICD-10-CM | POA: Diagnosis not present

## 2023-02-28 DIAGNOSIS — G8929 Other chronic pain: Secondary | ICD-10-CM | POA: Diagnosis not present

## 2023-03-02 DIAGNOSIS — G8929 Other chronic pain: Secondary | ICD-10-CM | POA: Diagnosis not present

## 2023-03-02 DIAGNOSIS — M25661 Stiffness of right knee, not elsewhere classified: Secondary | ICD-10-CM | POA: Diagnosis not present

## 2023-03-02 DIAGNOSIS — Z96651 Presence of right artificial knee joint: Secondary | ICD-10-CM | POA: Diagnosis not present

## 2023-03-02 DIAGNOSIS — R531 Weakness: Secondary | ICD-10-CM | POA: Diagnosis not present

## 2023-03-02 DIAGNOSIS — M25561 Pain in right knee: Secondary | ICD-10-CM | POA: Diagnosis not present

## 2023-03-03 DIAGNOSIS — Z96651 Presence of right artificial knee joint: Secondary | ICD-10-CM | POA: Diagnosis not present

## 2023-03-14 DIAGNOSIS — L821 Other seborrheic keratosis: Secondary | ICD-10-CM | POA: Diagnosis not present

## 2023-03-14 DIAGNOSIS — L570 Actinic keratosis: Secondary | ICD-10-CM | POA: Diagnosis not present

## 2023-03-14 DIAGNOSIS — Z08 Encounter for follow-up examination after completed treatment for malignant neoplasm: Secondary | ICD-10-CM | POA: Diagnosis not present

## 2023-03-14 DIAGNOSIS — D225 Melanocytic nevi of trunk: Secondary | ICD-10-CM | POA: Diagnosis not present

## 2023-03-14 DIAGNOSIS — Z85828 Personal history of other malignant neoplasm of skin: Secondary | ICD-10-CM | POA: Diagnosis not present

## 2023-03-14 DIAGNOSIS — D2261 Melanocytic nevi of right upper limb, including shoulder: Secondary | ICD-10-CM | POA: Diagnosis not present

## 2023-03-14 DIAGNOSIS — D2262 Melanocytic nevi of left upper limb, including shoulder: Secondary | ICD-10-CM | POA: Diagnosis not present

## 2023-03-14 DIAGNOSIS — X32XXXA Exposure to sunlight, initial encounter: Secondary | ICD-10-CM | POA: Diagnosis not present

## 2023-03-26 ENCOUNTER — Emergency Department
Admission: EM | Admit: 2023-03-26 | Discharge: 2023-03-26 | Disposition: A | Discharge status: Home / Self Care | Payer: Medicare Other | Attending: Emergency Medicine | Admitting: Emergency Medicine

## 2023-03-26 ENCOUNTER — Other Ambulatory Visit: Payer: Self-pay

## 2023-03-26 ENCOUNTER — Emergency Department: Payer: Medicare Other

## 2023-03-26 DIAGNOSIS — S0990XA Unspecified injury of head, initial encounter: Secondary | ICD-10-CM | POA: Diagnosis not present

## 2023-03-26 DIAGNOSIS — S2241XA Multiple fractures of ribs, right side, initial encounter for closed fracture: Secondary | ICD-10-CM | POA: Insufficient documentation

## 2023-03-26 DIAGNOSIS — W19XXXA Unspecified fall, initial encounter: Secondary | ICD-10-CM

## 2023-03-26 DIAGNOSIS — S301XXA Contusion of abdominal wall, initial encounter: Secondary | ICD-10-CM | POA: Diagnosis not present

## 2023-03-26 DIAGNOSIS — I2510 Atherosclerotic heart disease of native coronary artery without angina pectoris: Secondary | ICD-10-CM | POA: Insufficient documentation

## 2023-03-26 DIAGNOSIS — R109 Unspecified abdominal pain: Secondary | ICD-10-CM | POA: Diagnosis present

## 2023-03-26 DIAGNOSIS — S8991XA Unspecified injury of right lower leg, initial encounter: Secondary | ICD-10-CM | POA: Diagnosis not present

## 2023-03-26 DIAGNOSIS — I10 Essential (primary) hypertension: Secondary | ICD-10-CM | POA: Diagnosis not present

## 2023-03-26 DIAGNOSIS — Z043 Encounter for examination and observation following other accident: Secondary | ICD-10-CM | POA: Diagnosis not present

## 2023-03-26 DIAGNOSIS — S199XXA Unspecified injury of neck, initial encounter: Secondary | ICD-10-CM | POA: Diagnosis not present

## 2023-03-26 DIAGNOSIS — E119 Type 2 diabetes mellitus without complications: Secondary | ICD-10-CM | POA: Insufficient documentation

## 2023-03-26 LAB — COMPREHENSIVE METABOLIC PANEL
ALT: 14 U/L (ref 0–44)
AST: 15 U/L (ref 15–41)
Albumin: 3.7 g/dL (ref 3.5–5.0)
Alkaline Phosphatase: 94 U/L (ref 38–126)
Anion gap: 6 (ref 5–15)
BUN: 15 mg/dL (ref 8–23)
CO2: 22 mmol/L (ref 22–32)
Calcium: 10.3 mg/dL (ref 8.9–10.3)
Chloride: 111 mmol/L (ref 98–111)
Creatinine, Ser: 0.7 mg/dL (ref 0.61–1.24)
GFR, Estimated: >60 mL/min (ref >60)
Glucose, Bld: 152 mg/dL — ABNORMAL HIGH (ref 70–99)
Potassium: 4 mmol/L (ref 3.5–5.1)
Sodium: 139 mmol/L (ref 135–145)
Total Bilirubin: 0.7 mg/dL (ref 0.3–1.2)
Total Protein: 6.5 g/dL (ref 6.5–8.1)

## 2023-03-26 LAB — CBC WITH DIFFERENTIAL/PLATELET
Abs Immature Granulocytes: 0.01 10*3/uL (ref 0.00–0.07)
Basophils Absolute: 0 10*3/uL (ref 0.0–0.1)
Basophils Relative: 1 %
Eosinophils Absolute: 0.1 10*3/uL (ref 0.0–0.5)
Eosinophils Relative: 2 %
HCT: 41 % (ref 39.0–52.0)
Hemoglobin: 13.1 g/dL (ref 13.0–17.0)
Immature Granulocytes: 0 %
Lymphocytes Relative: 14 %
Lymphs Abs: 0.6 10*3/uL — ABNORMAL LOW (ref 0.7–4.0)
MCH: 30.3 pg (ref 26.0–34.0)
MCHC: 32 g/dL (ref 30.0–36.0)
MCV: 94.9 fL (ref 80.0–100.0)
Monocytes Absolute: 0.4 10*3/uL (ref 0.1–1.0)
Monocytes Relative: 8 %
Neutro Abs: 3.2 10*3/uL (ref 1.7–7.7)
Neutrophils Relative %: 75 %
Platelets: 196 10*3/uL (ref 150–400)
RBC: 4.32 MIL/uL (ref 4.22–5.81)
RDW: 15.8 % — ABNORMAL HIGH (ref 11.5–15.5)
WBC: 4.3 10*3/uL (ref 4.0–10.5)
nRBC: 0 % (ref 0.0–0.2)

## 2023-03-26 LAB — LACTIC ACID, PLASMA: Lactic Acid, Venous: 1.1 mmol/L (ref 0.5–1.9)

## 2023-03-26 LAB — LIPASE, BLOOD: Lipase: 26 U/L (ref 11–51)

## 2023-03-26 MED ORDER — OXYCODONE HCL 5 MG PO TABS: 5.0000 mg | ORAL_TABLET | Freq: Four times a day (QID) | ORAL | 0 refills | Status: DC | PRN

## 2023-03-26 MED ORDER — IOHEXOL 300 MG/ML  SOLN
100.0000 mL | Freq: Once | INTRAMUSCULAR | Status: AC | PRN
  Administered 2023-03-26: 100 mL via INTRAVENOUS

## 2023-03-26 MED ORDER — KETOROLAC TROMETHAMINE 30 MG/ML IJ SOLN
15.0000 mg | Freq: Once | INTRAMUSCULAR | Status: AC
  Administered 2023-03-26: 15 mg via INTRAVENOUS
  Filled 2023-03-26: qty 1

## 2023-03-26 MED ORDER — DOCUSATE SODIUM 100 MG PO CAPS: 100.0000 mg | ORAL_CAPSULE | Freq: Two times a day (BID) | ORAL | 0 refills | Status: DC

## 2023-03-26 MED ORDER — ONDANSETRON HCL 4 MG/2ML IJ SOLN
4.0000 mg | Freq: Once | INTRAMUSCULAR | Status: AC
  Administered 2023-03-26: 4 mg via INTRAVENOUS
  Filled 2023-03-26: qty 2

## 2023-03-26 MED ORDER — ACETAMINOPHEN 325 MG PO TABS
650.0000 mg | ORAL_TABLET | Freq: Once | ORAL | Status: AC
  Administered 2023-03-26: 650 mg via ORAL
  Filled 2023-03-26: qty 2

## 2023-03-26 MED ORDER — OXYCODONE-ACETAMINOPHEN 5-325 MG PO TABS: 2.0000 | ORAL_TABLET | Freq: Once | ORAL | Status: DC

## 2023-03-26 MED ORDER — NAPROXEN 250 MG PO TABS: 250.0000 mg | ORAL_TABLET | Freq: Two times a day (BID) | ORAL | 0 refills | Status: DC

## 2023-03-26 MED ORDER — ACETAMINOPHEN 500 MG PO TABS: 1000.0000 mg | ORAL_TABLET | Freq: Three times a day (TID) | ORAL | 0 refills | Status: DC

## 2023-03-26 MED ORDER — DOCUSATE SODIUM 100 MG PO CAPS: 100.0000 mg | ORAL_CAPSULE | Freq: Two times a day (BID) | ORAL | 0 refills | Status: AC

## 2023-03-26 MED ORDER — NAPROXEN 250 MG PO TABS: 250.0000 mg | ORAL_TABLET | Freq: Two times a day (BID) | ORAL | 0 refills | Status: AC

## 2023-03-26 MED ORDER — OXYCODONE-ACETAMINOPHEN 5-325 MG PO TABS
1.0000 | ORAL_TABLET | Freq: Once | ORAL | Status: AC
  Administered 2023-03-26: 1 via ORAL
  Filled 2023-03-26: qty 1

## 2023-03-26 MED ORDER — MORPHINE SULFATE (PF) 4 MG/ML IV SOLN
4.0000 mg | Freq: Once | INTRAVENOUS | Status: AC
  Administered 2023-03-26: 4 mg via INTRAVENOUS
  Filled 2023-03-26: qty 1

## 2023-03-26 MED ORDER — ACETAMINOPHEN 500 MG PO TABS: 1000.0000 mg | ORAL_TABLET | Freq: Three times a day (TID) | ORAL | 0 refills | Status: AC

## 2023-03-26 NOTE — ED Notes (Signed)
Patient ambulatory to restroom  ?

## 2023-03-26 NOTE — Discharge Instructions (Signed)
For your fractures:  Take Tylenol 1000 mg every 8 hours (scheduled) for the next 5-7 days Take Naproxen 250 mg with meals twice a day (scheduled) for 5-7 days Take the Oxycodone - 0.5 to 2 tablets (depending on pain) every 6 hours for severe pain. For now, I'd recommend taking 1 tablet every 6 hours for 24 hours then as needed.  WHEN TAKING THE OXYCODONE, take Colace (stool softener) twice a day.  I'd recommend buying and using MIRALAX - 1-2 cap fulls daily mixed with water or juice - while taking pain medications to prevent constipation.  Use the incentive spirometer every 1-2 hours.  Follow-up with primary doctor within the next week.

## 2023-03-26 NOTE — ED Provider Notes (Signed)
[facility] Provider Note    Event Date/Time   First MD Initiated Contact with Patient 03/26/23 [phone]     (approximate)   History   Back Pain   HPI  Zachary Martinez is a 82 y.o. male  here with R flank pain. Pt reports that 4 days ago he was loading a golf cart into his trailer when the cart rolled backwards, hitting and pushing him to the ground. It did not roll over him but did land "up against" his R side and knee. Since then he has had R flank pain, worse w/ any movement, palpation, and occasional deep inspiration. Pain has been constant but did seem to worsen overnight so he presents for evaluation. He has not had a BM since the fall but has been taking Oxycodone nightly left over from his knee replacement. No vomiting. His appetite has been normal. No SOB.       Physical Exam   Triage Vital Signs: ED Triage Vitals  Enc Vitals Group     BP 03/26/23 0712 (!) 170/72     Pulse Rate 03/26/23 0712 (!) 59     Resp 03/26/23 0712 20     Temp 03/26/23 0713 97.9 F (36.6 C)     Temp Source 03/26/23 0713 Oral     SpO2 03/26/23 0712 94 %     Weight 03/26/23 0712 251 lb 5.2 oz (114 kg)     Height 03/26/23 0712 5\' 9"  (1.753 m)     Head Circumference --      Peak Flow --      Pain Score 03/26/23 0712 10     Pain Loc --      Pain Edu? --      Excl. in GC? --     Most recent vital signs: Vitals:   03/26/23 0919 03/26/23 1000  BP: (!) 185/77 (!) 185/76  Pulse: 60 (!) 58  Resp: (!) 22 (!) 21  Temp:    SpO2: 96% 95%     General: Awake, no distress.  CV:  Good peripheral perfusion. RRR. Resp:  Normal work of breathing. Lungs clear. Abd:  No distention. Marked TTP over R flank and RUQ/RLQ with bruising along R lower flank. Other:  Appears uncomfortable. CNII-XII intact. No midline C,T, or L spine tenderness. Strength 5/5 bl UE and LE. Normal sensation to light touch.   ED Results / Procedures / Treatments   Labs (all labs ordered are listed, but  only abnormal results are displayed) Labs Reviewed  CBC WITH DIFFERENTIAL/PLATELET - Abnormal; Notable for the following components:      Result Value   RDW 15.8 (*)    Lymphs Abs 0.6 (*)    All other components within normal limits  COMPREHENSIVE METABOLIC PANEL - Abnormal; Notable for the following components:   Glucose, Bld 152 (*)    All other components within normal limits  LIPASE, BLOOD  LACTIC ACID, PLASMA     EKG    RADIOLOGY CT Head/C Spine: No acute abnormality CT C/A/P: Minimally displaced fx of R lateral 10th, posterior 11/12th ribs, no ptx, no other traumatic injury CT T/L spine: No acute fx DG Knee Right: Negative  I also independently reviewed and agree with radiologist interpretations.   PROCEDURES:  Critical Care performed: No   MEDICATIONS ORDERED IN ED: Medications  morphine (PF) 4 MG/ML injection 4 mg (4 mg Intravenous Given 03/26/23 0804)  ondansetron (ZOFRAN) injection 4 mg (4 mg Intravenous Given 03/26/23  4098)  iohexol (OMNIPAQUE) 300 MG/ML solution 100 mL (100 mLs Intravenous Contrast Given 03/26/23 0827)  oxyCODONE-acetaminophen (PERCOCET/ROXICET) 5-325 MG per tablet 1 tablet (1 tablet Oral Given 03/26/23 1006)  ketorolac (TORADOL) 30 MG/ML injection 15 mg (15 mg Intravenous Given 03/26/23 1007)  acetaminophen (TYLENOL) tablet 650 mg (650 mg Oral Given 03/26/23 1006)     IMPRESSION / MDM / ASSESSMENT AND PLAN / ED COURSE  I reviewed the triage vital signs and the nursing notes.                              Differential diagnosis includes, but is not limited to, fall with rib fractures, occult liver or renal hematoma/injury, thoracic or lumbar disc/spine injury  Patient's presentation is most consistent with acute presentation with potential threat to life or bodily function.  The patient is on the cardiac monitor to evaluate for evidence of arrhythmia and/or significant heart rate changes  82 yo M with PMHx  HTN, HLD, DM, CAD, here with  flank pain after fall several days ago. Pt is HDS and satting well on RA. Pt has bruising noted to R flank but is o/w well appearing. Full trauma scans obtained and show R 10-12th rib fx with on associated PTX, liver or renal lac or other complications. WBC normal. Hgb is normal. CMP unremarkable, with normal LFTs. Lipase normal. LA normal. Pain improved with IV analgesia and pt is ambulatory in ED.  Discussed management options with pt and wife. Given that he is several days out and his pain is relatively well controlled, he would like to continue outpt management at this time which I think is reasonable. His wife can help him at home and his son, who works in healthcare, can come tonight. Discussed return precautions. Will place on scheduled analgesia regimen in addition to bowel regiment, with good return precautions. Pt has an IS at home from recent knee surgery that he will use.   FINAL CLINICAL IMPRESSION(S) / ED DIAGNOSES   Final diagnoses:  Closed fracture of multiple ribs of right side, initial encounter  Fall, initial encounter     Rx / DC Orders   ED Discharge Orders          Ordered    naproxen (NAPROSYN) 250 MG tablet  2 times daily with meals        03/26/23 1022    acetaminophen (TYLENOL) 500 MG tablet  3 times daily        03/26/23 1022    docusate sodium (COLACE) 100 MG capsule  2 times daily        03/26/23 1022    oxyCODONE (ROXICODONE) 5 MG immediate release tablet  Every 6 hours PRN        03/26/23 1022             Note:  This document was prepared using Dragon voice recognition software and may include unintentional dictation errors.   Shaune Pollack, MD 03/26/23 1024

## 2023-03-26 NOTE — ED Triage Notes (Signed)
Pt reports Thursday was loading a golf cart and it slid off the trailer, hit him in his right abdomen and threw him to the ground. Pt with bruising to right side of abd. Pt also with bruising to right leg. Pt with difficulty standing and sitting. Refuses a wheelchair. Reports has to sleep sitting up due to pain. Reports has not been able to have a BM since the incident. Denies SOB or LOC

## 2023-03-30 ENCOUNTER — Telehealth: Payer: Self-pay

## 2023-03-30 NOTE — Telephone Encounter (Signed)
Transition Care Management Follow-up Telephone Call Date of discharge and from where: [facility] 5/26 How have you been since you were released from the hospital? Doing ok Any questions or concerns? No  Items Reviewed: Did the pt receive and understand the discharge instructions provided? Yes  Medications obtained and verified? Yes  Other? No  Any new allergies since your discharge? No  Dietary orders reviewed? No Do you have support at home? Yes    Follow up appointments reviewed:  PCP Hospital f/u appt confirmed? Yes  Scheduled to see PCP on NEXT WEEK @ . Specialist Hospital f/u appt confirmed?  Scheduled to see  on  @ . Are transportation arrangements needed?  If their condition worsens, is the pt aware to call PCP or go to the Emergency Dept.? Yes Was the patient provided with contact information for the PCP's office or ED? Yes Was to pt encouraged to call back with questions or concerns? Yes

## 2023-04-11 DIAGNOSIS — I10 Essential (primary) hypertension: Secondary | ICD-10-CM | POA: Diagnosis not present

## 2023-04-11 DIAGNOSIS — G4733 Obstructive sleep apnea (adult) (pediatric): Secondary | ICD-10-CM | POA: Diagnosis not present

## 2023-04-11 DIAGNOSIS — E039 Hypothyroidism, unspecified: Secondary | ICD-10-CM | POA: Diagnosis not present

## 2023-04-11 DIAGNOSIS — E278 Other specified disorders of adrenal gland: Secondary | ICD-10-CM | POA: Diagnosis not present

## 2023-04-11 DIAGNOSIS — E782 Mixed hyperlipidemia: Secondary | ICD-10-CM | POA: Diagnosis not present

## 2023-04-11 DIAGNOSIS — Z0000 Encounter for general adult medical examination without abnormal findings: Secondary | ICD-10-CM | POA: Diagnosis not present

## 2023-04-11 DIAGNOSIS — I2510 Atherosclerotic heart disease of native coronary artery without angina pectoris: Secondary | ICD-10-CM | POA: Diagnosis not present

## 2023-04-11 DIAGNOSIS — E119 Type 2 diabetes mellitus without complications: Secondary | ICD-10-CM | POA: Diagnosis not present

## 2023-05-10 DIAGNOSIS — Z96651 Presence of right artificial knee joint: Secondary | ICD-10-CM | POA: Diagnosis not present

## 2023-05-22 DIAGNOSIS — D1779 Benign lipomatous neoplasm of other sites: Secondary | ICD-10-CM | POA: Diagnosis not present

## 2023-08-07 DIAGNOSIS — Z23 Encounter for immunization: Secondary | ICD-10-CM | POA: Diagnosis not present

## 2023-08-08 DIAGNOSIS — Z96651 Presence of right artificial knee joint: Secondary | ICD-10-CM | POA: Diagnosis not present

## 2023-08-11 DIAGNOSIS — E039 Hypothyroidism, unspecified: Secondary | ICD-10-CM | POA: Diagnosis not present

## 2023-08-11 DIAGNOSIS — I872 Venous insufficiency (chronic) (peripheral): Secondary | ICD-10-CM | POA: Diagnosis not present

## 2023-08-11 DIAGNOSIS — Z6835 Body mass index (BMI) 35.0-35.9, adult: Secondary | ICD-10-CM | POA: Diagnosis not present

## 2023-08-11 DIAGNOSIS — E66812 Obesity, class 2: Secondary | ICD-10-CM | POA: Diagnosis not present

## 2023-08-11 DIAGNOSIS — I10 Essential (primary) hypertension: Secondary | ICD-10-CM | POA: Diagnosis not present

## 2023-08-11 DIAGNOSIS — N3281 Overactive bladder: Secondary | ICD-10-CM | POA: Diagnosis not present

## 2023-08-11 DIAGNOSIS — E782 Mixed hyperlipidemia: Secondary | ICD-10-CM | POA: Diagnosis not present

## 2023-08-11 DIAGNOSIS — E119 Type 2 diabetes mellitus without complications: Secondary | ICD-10-CM | POA: Diagnosis not present

## 2023-08-21 ENCOUNTER — Ambulatory Visit: Payer: Medicare Other | Admitting: Physician Assistant

## 2023-08-21 VITALS — BP 145/70 | HR 81 | Ht 69.0 in | Wt 230.0 lb

## 2023-08-21 DIAGNOSIS — R350 Frequency of micturition: Secondary | ICD-10-CM | POA: Diagnosis not present

## 2023-08-21 DIAGNOSIS — N401 Enlarged prostate with lower urinary tract symptoms: Secondary | ICD-10-CM | POA: Diagnosis not present

## 2023-08-21 LAB — URINALYSIS, COMPLETE
Bilirubin, UA: NEGATIVE
Ketones, UA: NEGATIVE
Leukocytes,UA: NEGATIVE
Nitrite, UA: NEGATIVE
Protein,UA: NEGATIVE
RBC, UA: NEGATIVE
Specific Gravity, UA: 1.01 (ref 1.005–1.030)
Urobilinogen, Ur: 0.2 mg/dL (ref 0.2–1.0)
pH, UA: 6 (ref 5.0–7.5)

## 2023-08-21 LAB — MICROSCOPIC EXAMINATION: Bacteria, UA: NONE SEEN

## 2023-08-21 LAB — BLADDER SCAN AMB NON-IMAGING: Scan Result: 39

## 2023-08-21 MED ORDER — GEMTESA 75 MG PO TABS
75.0000 mg | ORAL_TABLET | Freq: Every day | ORAL | Status: DC
Start: 2023-08-21 — End: 2024-09-20

## 2023-08-21 MED ORDER — TAMSULOSIN HCL 0.4 MG PO CAPS
0.4000 mg | ORAL_CAPSULE | Freq: Every day | ORAL | 11 refills | Status: DC
Start: 2023-08-21 — End: 2024-07-18

## 2023-08-21 NOTE — Patient Instructions (Signed)
Start Flomax (tamsulosin) once daily at bedtime for enlarged prostate. Start Gemtesa (vibegron) samples once daily for urinary urgency.

## 2023-08-21 NOTE — Progress Notes (Signed)
08/21/2023 2:31 PM   Zachary Martinez 06-Dec-1940 [id number]  CC: Chief Complaint  Patient presents with   Benign Prostatic Hypertrophy   HPI: Zachary Martinez is a 82 y.o. male with PMH diabetes on Invokamet, BLE edema on Lasix, hypertension on amlodipine, OSA on CPAP, BPH on alfuzosin, 4 mm nonobstructing right renal calculus, right renal AML, and right adrenal myelolipoma who presents today to discuss medication management.   Today he reports he stopped alfuzosin about a month ago secondary to acute on chronic BLE edema that he attributed to the medication.  He states that he is now back to his baseline BLE edema.  He has not noticed a huge change in his voiding symptoms off alfuzosin.  He was recently started on Lasix and admits to urinary frequency on this medication.  He primarily is bothered by urge incontinence.  He does not wear absorbent products for security.  He is compliant with CPAP.  He will be due for annual follow-up with Dr. Lonna Cobb in the next few weeks.  He was contacted by imaging earlier today to schedule recommended annual follow-up CT scan.  In-office UA today positive for 2+ glucose; urine microscopy pan negative.   IPSS 19/mixed as below.  PVR 39mL.   IPSS     Row Name 08/21/23 1400         International Prostate Symptom Score   How often have you had the sensation of not emptying your bladder? About half the time     How often have you had to urinate less than every two hours? More than half the time     How often have you found you stopped and started again several times when you urinated? Less than 1 in 5 times     How often have you found it difficult to postpone urination? Almost always     How often have you had a weak urinary stream? About half the time     How often have you had to strain to start urination? Less than 1 in 5 times     How many times did you typically get up at night to urinate? 2 Times     Total IPSS Score 19       Quality of Life  due to urinary symptoms   If you were to spend the rest of your life with your urinary condition just the way it is now how would you feel about that? Mixed               PMH: Past Medical History:  Diagnosis Date   Angiomyolipoma of right kidney    Arthritis of both knees    Bilateral carotid artery disease (HCC)    Bradycardia    CAD (coronary artery disease) 02/13/2013   a.) LHC 02/13/2013: 30% dLM, 40% oLAD, 50% pLAD, 40% D1, 30% pLCx, 60% mLCx-1, 20% mLCx-2, 60% OM1, 20% pRCA, 70% RPDA -- med mgmt; b.) MPI 06/29/2020: EF 50%, no ishemia/scar.   Cataract    a.) s/p extraction with IOL placement on RIGHT; b.) forming in LEFT eye   Diabetic polyneuropathy (HCC)    Diastolic dysfunction    a.) TTE 07/04/2017: EF 50%, mod LVH, mild MAC, mod BAE, mod RVE, mild PR, mod MR/TR, G1DD.   Diverticulosis    GERD (gastroesophageal reflux disease)    Hepatic steatosis    Hiatal hernia    Hyperlipidemia    Hypertension    Hypothyroidism    Left inguinal  hernia    Lumbar spondylosis    Myelolipoma of right adrenal gland 04/05/2020   a.) CT abd 04/06/2011: measures 2.9 cm; b.) CT A/P 08/12/2022: measured 5.7 cm   Nephrolithiasis    OSA on CPAP    Skin cancer    chest and ears   Splenomegaly    Spondylolisthesis at L5-S1 level    T2DM (type 2 diabetes mellitus) (HCC)    Valvular heart disease    a.) TTE 12/01/2014: mod MR/TR, sev PR; b.) TTE 07/04/2017: mild PR, mod MR/TR; c.) TTE 06/29/2020: mild MR/TR/PR   Venous insufficiency of both lower extremities     Surgical History: Past Surgical History:  Procedure Laterality Date   CATARACT EXTRACTION W/PHACO Right 11/01/2016   Procedure: CATARACT EXTRACTION PHACO AND INTRAOCULAR LENS PLACEMENT (IOC);  Surgeon: Galen Manila, MD;  Location: [facility];  Service: Ophthalmology;  Laterality: Right;  Lot #[id number] H [place]: oo:35.4 AP%; 22.4 CDE: 7.91   COLONOSCOPY     CYST REMOVAL NECK     EYE SURGERY Right    to remove blood from  busted blood vessel   HARDWARE REMOVAL Left 04/01/2021   Procedure: Screw removal from left proximal tibia;  Surgeon: Kennedy Bucker, MD;  Location: [facility];  Service: Orthopedics;  Laterality: Left;   INGUINAL HERNIA REPAIR     KNEE ARTHROSCOPY Right 2003   KNEE ARTHROSCOPY Left 2013   KNEE ARTHROSCOPY Right 2005   LEFT HEART CATH AND CORONARY ANGIOGRAPHY N/A 02/13/2013   Procedure: LEFT HEART CATH AND CORONARY ANGIOGRAPHY; Location: [facility]; Surgeon: Arnoldo Hooker, MD   NASAL POLYP SURGERY N/A 1978   POLYPECTOMY     TIBIA FRACTURE SURGERY Left 2012   TONSILLECTOMY AND ADENOIDECTOMY     TOTAL KNEE ARTHROPLASTY Left 06/08/2021   Procedure: TOTAL KNEE ARTHROPLASTY;  Surgeon: Kennedy Bucker, MD;  Location: [facility];  Service: Orthopedics;  Laterality: Left;   TOTAL KNEE ARTHROPLASTY Right 01/19/2023   Procedure: TOTAL KNEE ARTHROPLASTY;  Surgeon: Reinaldo Berber, MD;  Location: [facility];  Service: Orthopedics;  Laterality: Right;   UVULOPLASTY N/A 2000    Home Medications:  Allergies as of 08/21/2023       Reactions   Ace Inhibitors Anaphylaxis, Other (See Comments)   Swelling of tongue        Medication List        Accurate as of August 21, 2023  2:31 PM. If you have any questions, ask your nurse or doctor.          alfuzosin 10 MG 24 hr tablet Commonly known as: UROXATRAL Take 10 mg by mouth daily with breakfast.   amLODipine 10 MG tablet Commonly known as: NORVASC Take 1 tablet (10 mg total) by mouth daily.   aspirin EC 81 MG tablet Take 81 mg by mouth.   atorvastatin 40 MG tablet Commonly known as: LIPITOR TAKE 1 TABLET EVERY DAY What changed: when to take this   carvedilol 6.25 MG tablet Commonly known as: COREG Take 1 tablet (6.25 mg total) by mouth 2 (two) times daily with a meal.   enoxaparin 40 MG/0.4ML injection Commonly known as: LOVENOX Inject 0.4 mLs (40 mg total) into the skin daily for 14 days.   EPINEPHrine 0.3 mg/0.3 mL Soaj  injection Commonly known as: EPI-PEN Inject 0.3 mg into the muscle as needed for anaphylaxis.   furosemide 20 MG tablet Commonly known as: LASIX Take 20 mg by mouth daily.   hydrALAZINE 100 MG tablet Commonly known as: APRESOLINE Take 1 tablet (  100 mg total) by mouth 2 (two) times daily.   Invokamet [phone] MG Tabs Generic drug: Canagliflozin-metFORMIN HCl Take 1 tablet by mouth 2 (two) times daily.   levothyroxine 88 MCG tablet Commonly known as: SYNTHROID TAKE 1 TABLET EVERY DAY BEFORE BREAKFAST What changed: See the new instructions.   ondansetron 4 MG tablet Commonly known as: ZOFRAN Take 1 tablet (4 mg total) by mouth every 6 (six) hours as needed for nausea.   oxyCODONE 5 MG immediate release tablet Commonly known as: Roxicodone Take 1-2 tablets (5-10 mg total) by mouth every 6 (six) hours as needed for severe pain or moderate pain (no more than 6 tabs daily).   pioglitazone 30 MG tablet Commonly known as: ACTOS Take 1 tablet (30 mg total) by mouth every morning.   potassium chloride 10 MEQ tablet Commonly known as: KLOR-CON Take 10 mEq by mouth daily.        Allergies:  Allergies  Allergen Reactions   Ace Inhibitors Anaphylaxis and Other (See Comments)    Swelling of tongue    Family History: Family History  Problem Relation Age of Onset   Colon cancer Brother 51   Colon cancer Maternal Aunt 65   Colon cancer Maternal Aunt 46   Kidney disease Mother    Anemia Mother    Diabetes Brother    Hyperlipidemia Brother    Hypertension Brother    Colon polyps Brother    Esophageal cancer Neg Hx    Rectal cancer Neg Hx    Stomach cancer Neg Hx     Social History:   reports that he quit smoking about 44 years ago. His smoking use included cigarettes. He started smoking about 64 years ago. He has a 20 pack-year smoking history. He quit smokeless tobacco use about 43 years ago. He reports current alcohol use. He reports that he does not use  drugs.  Physical Exam: BP (!) 145/70   Pulse 81   Ht 5\' 9"  (1.753 m)   Wt 230 lb (104.3 kg)   BMI 33.97 kg/m   Constitutional:  Alert and oriented, no acute distress, nontoxic appearing HEENT: [place], AT Cardiovascular: No clubbing, cyanosis, or edema Respiratory: Normal respiratory effort, no increased work of breathing Skin: No rashes, bruises or suspicious lesions Neurologic: Grossly intact, no focal deficits, moving all 4 extremities Psychiatric: Normal mood and affect  Laboratory Data: Results for orders placed or performed in visit on 08/21/23  Microscopic Examination   Urine  Result Value Ref Range   WBC, UA 0-5 0 - 5 /hpf   RBC, Urine 0-2 0 - 2 /hpf   Epithelial Cells (non renal) 0-10 0 - 10 /hpf   Bacteria, UA None seen None seen/Few  Urinalysis, Complete  Result Value Ref Range   Specific Gravity, UA 1.010 1.005 - 1.030   pH, UA 6.0 5.0 - 7.5   Color, UA Yellow Yellow   Appearance Ur Clear Clear   Leukocytes,UA Negative Negative   Protein,UA Negative Negative/Trace   Glucose, UA 2+ (A) Negative   Ketones, UA Negative Negative   RBC, UA Negative Negative   Bilirubin, UA Negative Negative   Urobilinogen, Ur 0.2 0.2 - 1.0 mg/dL   Nitrite, UA Negative Negative   Microscopic Examination See below:   Bladder Scan (Post Void Residual) in office  Result Value Ref Range   Scan Result 39    Assessment & Plan:   1. Benign prostatic hyperplasia with urinary frequency Primarily bothered by storage related symptoms, though  he has mixed storage and obstructive symptoms on IPSS.  Will switch him from alfuzosin to tamsulosin and start Gemtesa samples and have him follow-up with Dr. Lonna Cobb next month for annual follow-up with CT prior.  He is in agreement with this plan.  I was very honest with him that his Invokamet and Lasix are likely contributing to urgency/frequency, so we may not be able to fully resolved these.  I also urged him to continue daily CPAP lest his nighttime  symptoms worsen. - Urinalysis, Complete - Bladder Scan (Post Void Residual) in office - tamsulosin (FLOMAX) 0.4 MG CAPS capsule; Take 1 capsule (0.4 mg total) by mouth daily.  Dispense: 30 capsule; Refill: 11 - Vibegron (GEMTESA) 75 MG TABS; Take 1 tablet (75 mg total) by mouth daily.   Return in about 4 weeks (around 09/18/2023) for Annual f/u with Dr. Lonna Cobb with IPSS, PVR, CT prior.  Carman Ching, PA-C  [facility] [place] [address] [place], [place] [address] [phone]

## 2023-08-29 ENCOUNTER — Ambulatory Visit: Payer: Medicare Other

## 2023-08-30 ENCOUNTER — Ambulatory Visit
Admission: RE | Admit: 2023-08-30 | Discharge: 2023-08-30 | Disposition: A | Discharge status: Home / Self Care | Payer: Medicare Other | Source: Ambulatory Visit | Attending: Urology | Admitting: Urology

## 2023-08-30 DIAGNOSIS — N200 Calculus of kidney: Secondary | ICD-10-CM | POA: Diagnosis not present

## 2023-08-30 DIAGNOSIS — N2889 Other specified disorders of kidney and ureter: Secondary | ICD-10-CM | POA: Insufficient documentation

## 2023-08-30 DIAGNOSIS — N281 Cyst of kidney, acquired: Secondary | ICD-10-CM | POA: Diagnosis not present

## 2023-08-30 DIAGNOSIS — D1771 Benign lipomatous neoplasm of kidney: Secondary | ICD-10-CM | POA: Diagnosis not present

## 2023-08-30 LAB — POCT I-STAT CREATININE: Creatinine, Ser: 0.9 mg/dL (ref 0.61–1.24)

## 2023-08-30 MED ORDER — IOHEXOL 300 MG/ML  SOLN
100.0000 mL | Freq: Once | INTRAMUSCULAR | Status: AC | PRN
  Administered 2023-08-30: 100 mL via INTRAVENOUS

## 2023-09-21 ENCOUNTER — Ambulatory Visit: Payer: Medicare Other | Admitting: Urology

## 2023-09-21 DIAGNOSIS — E782 Mixed hyperlipidemia: Secondary | ICD-10-CM | POA: Diagnosis not present

## 2023-09-21 DIAGNOSIS — I2510 Atherosclerotic heart disease of native coronary artery without angina pectoris: Secondary | ICD-10-CM | POA: Diagnosis not present

## 2023-09-21 DIAGNOSIS — G4733 Obstructive sleep apnea (adult) (pediatric): Secondary | ICD-10-CM | POA: Diagnosis not present

## 2023-09-21 DIAGNOSIS — E039 Hypothyroidism, unspecified: Secondary | ICD-10-CM | POA: Diagnosis not present

## 2023-09-21 DIAGNOSIS — I10 Essential (primary) hypertension: Secondary | ICD-10-CM | POA: Diagnosis not present

## 2023-09-21 DIAGNOSIS — I872 Venous insufficiency (chronic) (peripheral): Secondary | ICD-10-CM | POA: Diagnosis not present

## 2023-09-21 DIAGNOSIS — E119 Type 2 diabetes mellitus without complications: Secondary | ICD-10-CM | POA: Diagnosis not present

## 2023-09-22 ENCOUNTER — Encounter: Payer: Self-pay | Admitting: Urology

## 2023-09-22 ENCOUNTER — Ambulatory Visit: Payer: Medicare Other | Admitting: Urology

## 2023-09-22 VITALS — BP 150/76 | HR 80 | Ht 69.0 in | Wt 235.0 lb

## 2023-09-22 DIAGNOSIS — D1771 Benign lipomatous neoplasm of kidney: Secondary | ICD-10-CM

## 2023-09-22 DIAGNOSIS — N200 Calculus of kidney: Secondary | ICD-10-CM | POA: Diagnosis not present

## 2023-09-22 DIAGNOSIS — N401 Enlarged prostate with lower urinary tract symptoms: Secondary | ICD-10-CM | POA: Diagnosis not present

## 2023-09-22 NOTE — Progress Notes (Signed)
Charline Bills, Maysun Anabel Bene, acting as a Neurosurgeon for Zachary Altes, MD., have documented all relevant documentation on the behalf of Zachary Altes, MD, as directed by Zachary Altes, MD while in the presence of Zachary Altes, MD.  09/22/2023 1:00 PM   Willey Blade 1941-08-09 [id number]  Referring provider: Bosie Clos, MD [address] [place],  [place] [address]  Chief Complaint  Patient presents with   Benign Prostatic Hypertrophy   Urologic history 1. BPH with LUTS   2. Nephrolithiasis 4mm nonobstructing right renal calculus.   3. Renal cyst CT with bilateral simple renal cyst and left parapelvic cyst.   4. Right renal angiomyolipoma 8 mm right lower pole angiomyelopoma  HPI: KEISEAN SEABERG is a 82 y.o. male presents for a follow up visit.   He saw Hilton Sinclair on 08/21/2023 to discuss medication management. He had been on alfuzosin and developed worsening lower extremity edema, which he attributed to this medication. This improved when stopping the medication.  He was started on tamsulosin and Gemtesa. He has noted significant improvement in his voiding pattern on this regimen. He has 7 days of Gemtesa left and states he will not be able to afford the medication.  IPSS today at 19/35.  CT abdomen pelvis without contrast performed 08/30/2023 remarkable for a stable 5.5cm right adrenal myelolipoma. The right lower pole angiomyolipoma was measured at 12mm and an additional 5mm anterior right angiomyolipoma was noted. He had stable bilateral renal cysts and a 4mm non-obstructing right lower pole calculus.   PMH: Past Medical History:  Diagnosis Date   Angiomyolipoma of right kidney    Arthritis of both knees    Bilateral carotid artery disease (HCC)    Bradycardia    CAD (coronary artery disease) 02/13/2013   a.) LHC 02/13/2013: 30% dLM, 40% oLAD, 50% pLAD, 40% D1, 30% pLCx, 60% mLCx-1, 20% mLCx-2, 60% OM1, 20% pRCA, 70% RPDA -- med mgmt; b.) MPI 06/29/2020: EF  50%, no ishemia/scar.   Cataract    a.) s/p extraction with IOL placement on RIGHT; b.) forming in LEFT eye   Diabetic polyneuropathy (HCC)    Diastolic dysfunction    a.) TTE 07/04/2017: EF 50%, mod LVH, mild MAC, mod BAE, mod RVE, mild PR, mod MR/TR, G1DD.   Diverticulosis    GERD (gastroesophageal reflux disease)    Hepatic steatosis    Hiatal hernia    Hyperlipidemia    Hypertension    Hypothyroidism    Left inguinal hernia    Lumbar spondylosis    Myelolipoma of right adrenal gland 04/05/2020   a.) CT abd 04/06/2011: measures 2.9 cm; b.) CT A/P 08/12/2022: measured 5.7 cm   Nephrolithiasis    OSA on CPAP    Skin cancer    chest and ears   Splenomegaly    Spondylolisthesis at L5-S1 level    T2DM (type 2 diabetes mellitus) (HCC)    Valvular heart disease    a.) TTE 12/01/2014: mod MR/TR, sev PR; b.) TTE 07/04/2017: mild PR, mod MR/TR; c.) TTE 06/29/2020: mild MR/TR/PR   Venous insufficiency of both lower extremities     Surgical History: Past Surgical History:  Procedure Laterality Date   CATARACT EXTRACTION W/PHACO Right 11/01/2016   Procedure: CATARACT EXTRACTION PHACO AND INTRAOCULAR LENS PLACEMENT (IOC);  Surgeon: Galen Manila, MD;  Location: [facility];  Service: Ophthalmology;  Laterality: Right;  Lot #[id number] H [place]: oo:35.4 AP%; 22.4 CDE: 7.91   COLONOSCOPY  CYST REMOVAL NECK     EYE SURGERY Right    to remove blood from busted blood vessel   HARDWARE REMOVAL Left 04/01/2021   Procedure: Screw removal from left proximal tibia;  Surgeon: Kennedy Bucker, MD;  Location: [facility];  Service: Orthopedics;  Laterality: Left;   INGUINAL HERNIA REPAIR     KNEE ARTHROSCOPY Right 2003   KNEE ARTHROSCOPY Left 2013   KNEE ARTHROSCOPY Right 2005   LEFT HEART CATH AND CORONARY ANGIOGRAPHY N/A 02/13/2013   Procedure: LEFT HEART CATH AND CORONARY ANGIOGRAPHY; Location: [facility]; Surgeon: Arnoldo Hooker, MD   NASAL POLYP SURGERY N/A 1978   POLYPECTOMY     TIBIA FRACTURE  SURGERY Left 2012   TONSILLECTOMY AND ADENOIDECTOMY     TOTAL KNEE ARTHROPLASTY Left 06/08/2021   Procedure: TOTAL KNEE ARTHROPLASTY;  Surgeon: Kennedy Bucker, MD;  Location: [facility];  Service: Orthopedics;  Laterality: Left;   TOTAL KNEE ARTHROPLASTY Right 01/19/2023   Procedure: TOTAL KNEE ARTHROPLASTY;  Surgeon: Reinaldo Berber, MD;  Location: [facility];  Service: Orthopedics;  Laterality: Right;   UVULOPLASTY N/A 2000    Home Medications:  Allergies as of 09/22/2023       Reactions   Ace Inhibitors Anaphylaxis, Other (See Comments)   Swelling of tongue        Medication List        Accurate as of September 22, 2023  1:00 PM. If you have any questions, ask your nurse or doctor.          amLODipine 10 MG tablet Commonly known as: NORVASC Take 1 tablet (10 mg total) by mouth daily.   aspirin EC 81 MG tablet Take 81 mg by mouth.   atorvastatin 40 MG tablet Commonly known as: LIPITOR TAKE 1 TABLET EVERY DAY What changed: when to take this   carvedilol 6.25 MG tablet Commonly known as: COREG Take 1 tablet (6.25 mg total) by mouth 2 (two) times daily with a meal.   enoxaparin 40 MG/0.4ML injection Commonly known as: LOVENOX Inject 0.4 mLs (40 mg total) into the skin daily for 14 days.   EPINEPHrine 0.3 mg/0.3 mL Soaj injection Commonly known as: EPI-PEN Inject 0.3 mg into the muscle as needed for anaphylaxis.   furosemide 20 MG tablet Commonly known as: LASIX Take 20 mg by mouth daily.   Gemtesa 75 MG Tabs Generic drug: Vibegron Take 1 tablet (75 mg total) by mouth daily.   hydrALAZINE 100 MG tablet Commonly known as: APRESOLINE Take 1 tablet (100 mg total) by mouth 2 (two) times daily.   Invokamet [phone] MG Tabs Generic drug: Canagliflozin-metFORMIN HCl Take 1 tablet by mouth 2 (two) times daily.   levothyroxine 88 MCG tablet Commonly known as: SYNTHROID TAKE 1 TABLET EVERY DAY BEFORE BREAKFAST What changed: See the new instructions.    ondansetron 4 MG tablet Commonly known as: ZOFRAN Take 1 tablet (4 mg total) by mouth every 6 (six) hours as needed for nausea.   oxyCODONE 5 MG immediate release tablet Commonly known as: Roxicodone Take 1-2 tablets (5-10 mg total) by mouth every 6 (six) hours as needed for severe pain or moderate pain (no more than 6 tabs daily).   pioglitazone 30 MG tablet Commonly known as: ACTOS Take 1 tablet (30 mg total) by mouth every morning.   potassium chloride 10 MEQ tablet Commonly known as: KLOR-CON Take 10 mEq by mouth daily.   tamsulosin 0.4 MG Caps capsule Commonly known as: FLOMAX Take 1 capsule (0.4 mg total) by  mouth daily.        Allergies:  Allergies  Allergen Reactions   Ace Inhibitors Anaphylaxis and Other (See Comments)    Swelling of tongue    Family History: Family History  Problem Relation Age of Onset   Colon cancer Brother 63   Colon cancer Maternal Aunt 42   Colon cancer Maternal Aunt 80   Kidney disease Mother    Anemia Mother    Diabetes Brother    Hyperlipidemia Brother    Hypertension Brother    Colon polyps Brother    Esophageal cancer Neg Hx    Rectal cancer Neg Hx    Stomach cancer Neg Hx     Social History:  reports that he quit smoking about 44 years ago. His smoking use included cigarettes. He started smoking about 64 years ago. He has a 20 pack-year smoking history. He quit smokeless tobacco use about 43 years ago. He reports current alcohol use. He reports that he does not use drugs.   Physical Exam: BP (!) 150/76   Pulse 80   Ht 5\' 9"  (1.753 m)   Wt 235 lb (106.6 kg)   BMI 34.70 kg/m   Constitutional:  Alert, No acute distress. HEENT: [place] AT Respiratory: Normal respiratory effort, no increased work of breathing. GI: Abdomen is soft, nontender, nondistended, no abdominal masses Psychiatric: Normal mood and affect.   Pertinent Imaging: CT was personally reviewed and interpreted.   CT EXAM: CT ABDOMEN WITHOUT AND WITH  CONTRAST   TECHNIQUE: Multidetector CT imaging of the abdomen was performed following the standard protocol before and following the bolus administration of intravenous contrast.   RADIATION DOSE REDUCTION: This exam was performed according to the departmental dose-optimization program which includes automated exposure control, adjustment of the mA and/or kV according to patient size and/or use of iterative reconstruction technique.   CONTRAST:  OMNIPAQUE IOHEXOL 300 MG/ML  SOLN   COMPARISON:  CT abdomen/pelvis dated 03/26/2023   FINDINGS: Lower chest: Mild subpleural reticulation at the lung bases. Cardiomegaly with a small pericardial effusion, unchanged.   Hepatobiliary: Liver is within normal limits.   Gallbladder is unremarkable. No intrahepatic or extrahepatic duct dilatation.   Pancreas: Within normal limits.   Spleen: Within normal limits   Adrenals/Urinary Tract: 5.5 cm right adrenal myelolipoma (series 11/image 68), benign. No follow-up is recommended. Left adrenal gland is within normal limits.   12 mm right lower pole renal angiomyolipoma (series 11/image 117), benign. Additional 5 mm anterior interpolar right renal angiomyolipoma (series 11/image 113), benign. Bilateral simple renal cysts and renal sinus cysts, measuring up to 5.1 cm in the left upper kidney (series 11, image 89), benign (Bosniak I). No follow-up is recommended.   4 mm nonobstructing right lower pole renal calculus (series 11/image 119). No hydronephrosis.   Stomach/Bowel: Stomach is within normal limits.   Visualized bowel is unremarkable.   Vascular/Lymphatic: No evidence of abdominal aortic aneurysm.   Atherosclerotic calcifications of the abdominal aorta and branch vessels.   No suspicious abdominopelvic lymphadenopathy.   Other: No abdominal ascites.   Musculoskeletal: Mild degenerative changes of the lower thoracic spine.   IMPRESSION: Benign right adrenal  myelolipoma.   Two small right renal angiomyolipomas, benign. Additional simple bilateral renal cysts, benign (Bosniak I). No follow-up is recommended.   4 mm nonobstructing right lower pole renal calculus. No hydronephrosis.     Electronically Signed   By: Charline Bills M.D.   On: 09/14/2023 00:34   Assessment & Plan:  1. BPH with LUTS Currently satisfied on tamsulosin and Gemtesa.  Once he runs out of [place], have recommended he see if his voiding symptoms change, if not, he will continue tamsulosin only.  If he does note worsening storage related to voiding symptoms after stopping Gemtesa. We'll see if Myrbetriq is affordable and if not consider Trospium.   2. Renal angiomyolipoma Slight increased size based on last scan, but remains small.   3. Nephrolithiasis Stable right lower pole calculus- non obstructing based on last scan  He will follow up in 1 year, he was instructed to call earlier for any worsening symptoms.   I have reviewed the above documentation for accuracy and completeness, and I agree with the above.   Zachary Altes, MD  [facility] [address], [address] [place], [place] [address] [phone]

## 2023-10-12 DIAGNOSIS — D225 Melanocytic nevi of trunk: Secondary | ICD-10-CM | POA: Diagnosis not present

## 2023-10-12 DIAGNOSIS — L821 Other seborrheic keratosis: Secondary | ICD-10-CM | POA: Diagnosis not present

## 2023-10-12 DIAGNOSIS — D2261 Melanocytic nevi of right upper limb, including shoulder: Secondary | ICD-10-CM | POA: Diagnosis not present

## 2023-10-12 DIAGNOSIS — L570 Actinic keratosis: Secondary | ICD-10-CM | POA: Diagnosis not present

## 2023-10-12 DIAGNOSIS — Z85828 Personal history of other malignant neoplasm of skin: Secondary | ICD-10-CM | POA: Diagnosis not present

## 2023-10-12 DIAGNOSIS — D2262 Melanocytic nevi of left upper limb, including shoulder: Secondary | ICD-10-CM | POA: Diagnosis not present

## 2023-10-12 DIAGNOSIS — Z08 Encounter for follow-up examination after completed treatment for malignant neoplasm: Secondary | ICD-10-CM | POA: Diagnosis not present

## 2023-10-12 DIAGNOSIS — D485 Neoplasm of uncertain behavior of skin: Secondary | ICD-10-CM | POA: Diagnosis not present

## 2023-10-19 ENCOUNTER — Ambulatory Visit (INDEPENDENT_AMBULATORY_CARE_PROVIDER_SITE_OTHER): Payer: Medicare Other | Admitting: Nurse Practitioner

## 2023-10-19 ENCOUNTER — Encounter (INDEPENDENT_AMBULATORY_CARE_PROVIDER_SITE_OTHER): Payer: Self-pay | Admitting: Nurse Practitioner

## 2023-10-19 VITALS — BP 134/77 | HR 86 | Resp 18 | Ht 69.0 in | Wt 235.0 lb

## 2023-10-19 DIAGNOSIS — E1142 Type 2 diabetes mellitus with diabetic polyneuropathy: Secondary | ICD-10-CM | POA: Diagnosis not present

## 2023-10-19 DIAGNOSIS — I10 Essential (primary) hypertension: Secondary | ICD-10-CM | POA: Diagnosis not present

## 2023-10-19 DIAGNOSIS — I872 Venous insufficiency (chronic) (peripheral): Secondary | ICD-10-CM | POA: Diagnosis not present

## 2023-10-30 NOTE — Progress Notes (Signed)
Subjective:    Patient ID: Zachary Martinez, male    DOB: [birth year], 82 y.o.   MRN: [id number] Chief Complaint  Patient presents with   New Patient (Initial Visit)    np. consult. bil venous insufficiency. Sullivan Lone, richard    Zachary Martinez is a 82 year old male who presents today for evaluation of bilateral venous insufficiency.  He is referred by his primary care physician.  He has had his notes today for an extended toe which seems to be getting worse as of lately.  Currently denies open wounds or ulcerations.  He has also had some associated swelling of the lower extremities and was given Lasix by his primary care provider and since that time it has been a little bit improved.    Review of Systems  Cardiovascular:  Positive for leg swelling.  Skin:  Positive for color change.  All other systems reviewed and are negative.      Objective:   Physical Exam Vitals reviewed.  HENT:     Head: Normocephalic.  Cardiovascular:     Rate and Rhythm: Normal rate.     Pulses: Normal pulses.  Pulmonary:     Effort: Pulmonary effort is normal.  Skin:    General: Skin is warm and dry.  Neurological:     Mental Status: He is alert and oriented to person, place, and time.  Psychiatric:        Mood and Affect: Mood normal.        Behavior: Behavior normal.        Thought Content: Thought content normal.        Judgment: Judgment normal.     BP 134/77   Pulse 86   Resp 18   Ht 5\' 9"  (1.753 m)   Wt 235 lb (106.6 kg)   BMI 34.70 kg/m   Past Medical History:  Diagnosis Date   Angiomyolipoma of right kidney    Arthritis of both knees    Bilateral carotid artery disease (HCC)    Bradycardia    CAD (coronary artery disease) 02/13/2013   a.) LHC 02/13/2013: 30% dLM, 40% oLAD, 50% pLAD, 40% D1, 30% pLCx, 60% mLCx-1, 20% mLCx-2, 60% OM1, 20% pRCA, 70% RPDA -- med mgmt; b.) MPI 06/29/2020: EF 50%, no ishemia/scar.   Cataract    a.) s/p extraction with IOL placement on RIGHT; b.) forming in  LEFT eye   Diabetic polyneuropathy (HCC)    Diastolic dysfunction    a.) TTE 07/04/2017: EF 50%, mod LVH, mild MAC, mod BAE, mod RVE, mild PR, mod MR/TR, G1DD.   Diverticulosis    GERD (gastroesophageal reflux disease)    Hepatic steatosis    Hiatal hernia    Hyperlipidemia    Hypertension    Hypothyroidism    Left inguinal hernia    Lumbar spondylosis    Myelolipoma of right adrenal gland 04/05/2020   a.) CT abd 04/06/2011: measures 2.9 cm; b.) CT A/P 08/12/2022: measured 5.7 cm   Nephrolithiasis    OSA on CPAP    Skin cancer    chest and ears   Splenomegaly    Spondylolisthesis at L5-S1 level    T2DM (type 2 diabetes mellitus) (HCC)    Valvular heart disease    a.) TTE 12/01/2014: mod MR/TR, sev PR; b.) TTE 07/04/2017: mild PR, mod MR/TR; c.) TTE 06/29/2020: mild MR/TR/PR   Venous insufficiency of both lower extremities     Social History   Socioeconomic History   Marital status: Married  Spouse name: Not on file   Number of children: 3   Years of education: Not on file   Highest education level: Associate degree: occupational, Scientist, product/process development, or vocational program  Occupational History   Occupation: retired  Tobacco Use   Smoking status: Former    Current packs/day: 0.00    Average packs/day: 1 pack/day for 20.0 years (20.0 ttl pk-yrs)    Types: Cigarettes    Start date: 07/24/1959    Quit date: 07/24/1979    Years since quitting: 44.2   Smokeless tobacco: Former    Quit date: 11/01/1979  Vaping Use   Vaping status: Never Used  Substance and Sexual Activity   Alcohol use: Yes    Comment: wine 2-3xs a year   Drug use: No   Sexual activity: Not on file  Other Topics Concern   Not on file  Social History Narrative   Not on file   Social Drivers of Health   Financial Resource Strain: Low Risk  (05/18/2023)   Received from [facility]   Overall Financial Resource Strain (CARDIA)    Difficulty of Paying Living Expenses: Not very hard  Food  Insecurity: No Food Insecurity (05/18/2023)   Received from [facility]   Hunger Vital Sign    Worried About Running Out of Food in the Last Year: Never true    Ran Out of Food in the Last Year: Never true  Transportation Needs: No Transportation Needs (05/18/2023)   Received from [facility] - Transportation    In the past 12 months, has lack of transportation kept you from medical appointments or from getting medications?: No    Lack of Transportation (Non-Medical): No  Physical Activity: Insufficiently Active (04/11/2023)   Received from [facility]   Exercise Vital Sign    Days of Exercise per Week: 4 days    Minutes of Exercise per Session: 30 min  Stress: No Stress Concern Present (01/05/2022)   [employer] of Occupational Health - Occupational Stress Questionnaire    Feeling of Stress : Only a little  Social Connections: Moderately Integrated (01/05/2022)   Social Connection and Isolation Panel [NHANES]    Frequency of Communication with Friends and Family: More than three times a week    Frequency of Social Gatherings with Friends and Family: Once a week    Attends Religious Services: More than 4 times per year    Active Member of [employer] or Organizations: No    Attends Banker Meetings: Never    Marital Status: Married  Catering manager Violence: Not At Risk (01/19/2023)   Humiliation, Afraid, Rape, and Kick questionnaire    Fear of Current or Ex-Partner: No    Emotionally Abused: No    Physically Abused: No    Sexually Abused: No    Past Surgical History:  Procedure Laterality Date   CATARACT EXTRACTION W/PHACO Right 11/01/2016   Procedure: CATARACT EXTRACTION PHACO AND INTRAOCULAR LENS PLACEMENT (IOC);  Surgeon: Galen Manila, MD;  Location: [facility] OR[facility];  Service: Ophthalmology;  Laterality: Right;  Lot #[id number] H [place]: oo:35.4 AP%; 22.4 CDE: 7.91   COLONOSCOPY     CYST REMOVAL NECK     EYE  SURGERY Right    to remove blood from busted blood vessel   HARDWARE REMOVAL Left 04/01/2021   Procedure: Screw removal from left proximal tibia;  Surgeon: Kennedy Bucker, MD;  Location: [facility];  Service: Orthopedics;  Laterality: Left;  INGUINAL HERNIA REPAIR     KNEE ARTHROSCOPY Right 2003   KNEE ARTHROSCOPY Left 2013   KNEE ARTHROSCOPY Right 2005   LEFT HEART CATH AND CORONARY ANGIOGRAPHY N/A 02/13/2013   Procedure: LEFT HEART CATH AND CORONARY ANGIOGRAPHY; Location: [facility]; Surgeon: Arnoldo Hooker, MD   NASAL POLYP SURGERY N/A 1978   POLYPECTOMY     TIBIA FRACTURE SURGERY Left 2012   TONSILLECTOMY AND ADENOIDECTOMY     TOTAL KNEE ARTHROPLASTY Left 06/08/2021   Procedure: TOTAL KNEE ARTHROPLASTY;  Surgeon: Kennedy Bucker, MD;  Location: [facility];  Service: Orthopedics;  Laterality: Left;   TOTAL KNEE ARTHROPLASTY Right 01/19/2023   Procedure: TOTAL KNEE ARTHROPLASTY;  Surgeon: Reinaldo Berber, MD;  Location: [facility];  Service: Orthopedics;  Laterality: Right;   UVULOPLASTY N/A 2000    Family History  Problem Relation Age of Onset   Colon cancer Brother 40   Colon cancer Maternal Aunt 58   Colon cancer Maternal Aunt 91   Kidney disease Mother    Anemia Mother    Diabetes Brother    Hyperlipidemia Brother    Hypertension Brother    Colon polyps Brother    Esophageal cancer Neg Hx    Rectal cancer Neg Hx    Stomach cancer Neg Hx     Allergies  Allergen Reactions   Ace Inhibitors Anaphylaxis and Other (See Comments)    Swelling of tongue       Latest Ref Rng & Units 03/26/2023    7:42 AM 01/20/2023    4:38 AM 08/10/2022    1:33 PM  CBC  WBC 4.0 - 10.5 K/uL 4.3  7.6  4.3   Hemoglobin 13.0 - 17.0 g/dL 82.9  56.2  13.0   Hematocrit 39.0 - 52.0 % 41.0  36.5  41.0   Platelets 150 - 400 K/uL 196  160  187       CMP     Component Value Date/Time   NA 139 03/26/2023 0742   NA 143 08/10/2022 1333   NA 142 12/06/2012 0426   K 4.0 03/26/2023 0742   K 4.3 12/06/2012  0426   CL 111 03/26/2023 0742   CL 114 (H) 12/06/2012 0426   CO2 22 03/26/2023 0742   CO2 20 (L) 12/06/2012 0426   GLUCOSE 152 (H) 03/26/2023 0742   GLUCOSE 327 (H) 12/06/2012 0426   BUN 15 03/26/2023 0742   BUN 14 08/10/2022 1333   BUN 35 (H) 12/06/2012 0426   CREATININE 0.90 08/30/2023 1031   CREATININE 1.26 12/06/2012 0426   CALCIUM 10.3 03/26/2023 0742   CALCIUM 10.1 12/06/2012 0426   PROT 6.5 03/26/2023 0742   PROT 5.9 (L) 08/10/2022 1333   PROT 7.3 12/05/2012 2208   ALBUMIN 3.7 03/26/2023 0742   ALBUMIN 4.0 08/10/2022 1333   ALBUMIN 3.8 12/05/2012 2208   AST 15 03/26/2023 0742   AST 24 12/05/2012 2208   ALT 14 03/26/2023 0742   ALT 34 12/05/2012 2208   ALKPHOS 94 03/26/2023 0742   ALKPHOS 151 (H) 12/05/2012 2208   BILITOT 0.7 03/26/2023 0742   BILITOT 0.4 08/10/2022 1333   BILITOT 0.2 12/05/2012 2208   EGFR 88 08/10/2022 1333   GFRNONAA >60 03/26/2023 0742   GFRNONAA 57 (L) 12/06/2012 0426     No results found.     Assessment & Plan:   1. Venous insufficiency of both lower extremities (Primary) No surgery or intervention at this point in time.   The patient is CEAP [id number]  I have discussed with the patient venous insufficiency and why it  causes symptoms. I have discussed with the patient the chronic skin changes that accompany venous insufficiency and the long term sequela such as infection and ulceration.  Patient will begin wearing graduated compression stockings or compression wraps on a daily basis.  The patient will put the compression on first thing in the morning and removing them in the evening. The patient is instructed specifically not to sleep in the compression.    In addition, behavioral modification including several periods of elevation of the lower extremities during the day will be continued.   Patient should undergo duplex ultrasound of the venous system to ensure that DVT or reflux is not present.   2. Primary hypertension Continue  antihypertensive medications as already ordered, these medications have been reviewed and there are no changes at this time.  3. Diabetic polyneuropathy associated with type 2 diabetes mellitus (HCC) Continue hypoglycemic medications as already ordered, these medications have been reviewed and there are no changes at this time.  Hgb A1C to be monitored as already arranged by primary service   Current Outpatient Medications on File Prior to Visit  Medication Sig Dispense Refill   amLODipine (NORVASC) 10 MG tablet Take 1 tablet (10 mg total) by mouth daily. 90 tablet 0   atorvastatin (LIPITOR) 40 MG tablet TAKE 1 TABLET EVERY DAY (Patient taking differently: Take 40 mg by mouth every evening.) 90 tablet 3   Canagliflozin-metFORMIN HCl (INVOKAMET) [phone] MG TABS Take 1 tablet by mouth 2 (two) times daily. 180 tablet 0   carvedilol (COREG) 6.25 MG tablet Take 1 tablet (6.25 mg total) by mouth 2 (two) times daily with a meal. 180 tablet 3   furosemide (LASIX) 20 MG tablet Take 20 mg by mouth daily.     hydrALAZINE (APRESOLINE) 100 MG tablet Take 1 tablet (100 mg total) by mouth 2 (two) times daily. 180 tablet 1   levothyroxine (SYNTHROID) 88 MCG tablet TAKE 1 TABLET EVERY DAY BEFORE BREAKFAST (Patient taking differently: Take 88 mcg by mouth daily before breakfast.) 90 tablet 0   pioglitazone (ACTOS) 30 MG tablet Take 1 tablet (30 mg total) by mouth every morning. 90 tablet 3   potassium chloride (KLOR-CON M) 10 MEQ tablet Take 10 mEq by mouth 2 (two) times daily.     potassium chloride (KLOR-CON) 10 MEQ tablet Take 10 mEq by mouth daily.     tamsulosin (FLOMAX) 0.4 MG CAPS capsule Take 1 capsule (0.4 mg total) by mouth daily. 30 capsule 11   aspirin EC 81 MG tablet Take 81 mg by mouth.     enoxaparin (LOVENOX) 40 MG/0.4ML injection Inject 0.4 mLs (40 mg total) into the skin daily for 14 days. 5.6 mL 0   EPINEPHrine 0.3 mg/0.3 mL IJ SOAJ injection Inject 0.3 mg into the muscle as needed for  anaphylaxis.     ondansetron (ZOFRAN) 4 MG tablet Take 1 tablet (4 mg total) by mouth every 6 (six) hours as needed for nausea. 20 tablet 0   Vibegron (GEMTESA) 75 MG TABS Take 1 tablet (75 mg total) by mouth daily.     No current facility-administered medications on file prior to visit.    There are no Patient Instructions on file for this visit. No follow-ups on file.   Georgiana Spinner, NP

## 2023-11-14 ENCOUNTER — Other Ambulatory Visit (INDEPENDENT_AMBULATORY_CARE_PROVIDER_SITE_OTHER): Payer: Self-pay | Admitting: Nurse Practitioner

## 2023-11-14 DIAGNOSIS — I872 Venous insufficiency (chronic) (peripheral): Secondary | ICD-10-CM

## 2023-11-16 ENCOUNTER — Ambulatory Visit (INDEPENDENT_AMBULATORY_CARE_PROVIDER_SITE_OTHER): Payer: Medicare Other

## 2023-11-16 ENCOUNTER — Encounter (INDEPENDENT_AMBULATORY_CARE_PROVIDER_SITE_OTHER): Payer: Self-pay | Admitting: Vascular Surgery

## 2023-11-16 ENCOUNTER — Ambulatory Visit (INDEPENDENT_AMBULATORY_CARE_PROVIDER_SITE_OTHER): Payer: Medicare Other | Admitting: Vascular Surgery

## 2023-11-16 VITALS — BP 132/79 | HR 72 | Resp 16 | Wt 243.0 lb

## 2023-11-16 DIAGNOSIS — E1140 Type 2 diabetes mellitus with diabetic neuropathy, unspecified: Secondary | ICD-10-CM

## 2023-11-16 DIAGNOSIS — I2510 Atherosclerotic heart disease of native coronary artery without angina pectoris: Secondary | ICD-10-CM | POA: Diagnosis not present

## 2023-11-16 DIAGNOSIS — E7849 Other hyperlipidemia: Secondary | ICD-10-CM | POA: Diagnosis not present

## 2023-11-16 DIAGNOSIS — I872 Venous insufficiency (chronic) (peripheral): Secondary | ICD-10-CM | POA: Diagnosis not present

## 2023-11-16 DIAGNOSIS — I890 Lymphedema, not elsewhere classified: Secondary | ICD-10-CM

## 2023-11-16 DIAGNOSIS — I10 Essential (primary) hypertension: Secondary | ICD-10-CM

## 2023-11-18 ENCOUNTER — Encounter (INDEPENDENT_AMBULATORY_CARE_PROVIDER_SITE_OTHER): Payer: Self-pay | Admitting: Vascular Surgery

## 2023-11-18 DIAGNOSIS — I890 Lymphedema, not elsewhere classified: Secondary | ICD-10-CM | POA: Insufficient documentation

## 2023-11-18 NOTE — Progress Notes (Signed)
MRN : [id number]  Zachary Martinez is a 83 y.o. ([birth year]) male who presents with chief complaint of legs swell.  History of Present Illness: The patient returns to the office for followup evaluation regarding leg swelling.  The swelling has persisted and the pain associated with swelling continues. There have not been any interval development of a ulcerations or wounds.  Since the previous visit the patient has been wearing graduated compression stockings and has noted little if any improvement in the lymphedema. The patient has been using compression routinely morning until night.  The patient also states elevation during the day and exercise is being done too.  Current Meds  Medication Sig   amLODipine (NORVASC) 10 MG tablet Take 1 tablet (10 mg total) by mouth daily.   atorvastatin (LIPITOR) 40 MG tablet TAKE 1 TABLET EVERY DAY (Patient taking differently: Take 40 mg by mouth every evening.)   Canagliflozin-metFORMIN HCl (INVOKAMET) [id number] MG TABS Take 1 tablet by mouth 2 (two) times daily.   carvedilol (COREG) 6.25 MG tablet Take 1 tablet (6.25 mg total) by mouth 2 (two) times daily with a meal.   EPINEPHrine 0.3 mg/0.3 mL IJ SOAJ injection Inject 0.3 mg into the muscle as needed for anaphylaxis.   furosemide (LASIX) 20 MG tablet Take 20 mg by mouth daily.   hydrALAZINE (APRESOLINE) 100 MG tablet Take 1 tablet (100 mg total) by mouth 2 (two) times daily.   levothyroxine (SYNTHROID) 88 MCG tablet TAKE 1 TABLET EVERY DAY BEFORE BREAKFAST (Patient taking differently: Take 88 mcg by mouth daily before breakfast.)   pioglitazone (ACTOS) 30 MG tablet Take 1 tablet (30 mg total) by mouth every morning.   potassium chloride (KLOR-CON M) 10 MEQ tablet Take 10 mEq by mouth 2 (two) times daily.   tamsulosin (FLOMAX) 0.4 MG CAPS capsule Take 1 capsule (0.4 mg total) by mouth daily.    Past Medical History:  Diagnosis Date   Angiomyolipoma of right kidney     Arthritis of both knees    Bilateral carotid artery disease (HCC)    Bradycardia    CAD (coronary artery disease) 02/13/2013   a.) LHC 02/13/2013: 30% dLM, 40% oLAD, 50% pLAD, 40% D1, 30% pLCx, 60% mLCx-1, 20% mLCx-2, 60% OM1, 20% pRCA, 70% RPDA -- med mgmt; b.) MPI 06/29/2020: EF 50%, no ishemia/scar.   Cataract    a.) s/p extraction with IOL placement on RIGHT; b.) forming in LEFT eye   Diabetic polyneuropathy (HCC)    Diastolic dysfunction    a.) TTE 07/04/2017: EF 50%, mod LVH, mild MAC, mod BAE, mod RVE, mild PR, mod MR/TR, G1DD.   Diverticulosis    GERD (gastroesophageal reflux disease)    Hepatic steatosis    Hiatal hernia    Hyperlipidemia    Hypertension    Hypothyroidism    Left inguinal hernia    Lumbar spondylosis    Myelolipoma of right adrenal gland 04/05/2020   a.) CT abd 04/06/2011: measures 2.9 cm; b.) CT A/P 08/12/2022: measured 5.7 cm   Nephrolithiasis    OSA on CPAP    Skin cancer    chest and ears   Splenomegaly    Spondylolisthesis at L5-S1 level    T2DM (type 2 diabetes mellitus) (HCC)    Valvular heart disease    a.) TTE 12/01/2014: mod MR/TR, sev  PR; b.) TTE 07/04/2017: mild PR, mod MR/TR; c.) TTE 06/29/2020: mild MR/TR/PR   Venous insufficiency of both lower extremities     Past Surgical History:  Procedure Laterality Date   CATARACT EXTRACTION W/PHACO Right 11/01/2016   Procedure: CATARACT EXTRACTION PHACO AND INTRAOCULAR LENS PLACEMENT (IOC);  Surgeon: Galen Manila, MD;  Location: [facility];  Service: Ophthalmology;  Laterality: Right;  Lot #[id number] H [place]: oo:35.4 AP%; 22.4 CDE: 7.91   COLONOSCOPY     CYST REMOVAL NECK     EYE SURGERY Right    to remove blood from busted blood vessel   HARDWARE REMOVAL Left 04/01/2021   Procedure: Screw removal from left proximal tibia;  Surgeon: Kennedy Bucker, MD;  Location: [facility];  Service: Orthopedics;  Laterality: Left;   INGUINAL HERNIA REPAIR     KNEE ARTHROSCOPY Right 2003   KNEE ARTHROSCOPY Left  2013   KNEE ARTHROSCOPY Right 2005   LEFT HEART CATH AND CORONARY ANGIOGRAPHY N/A 02/13/2013   Procedure: LEFT HEART CATH AND CORONARY ANGIOGRAPHY; Location: [facility]; Surgeon: Arnoldo Hooker, MD   NASAL POLYP SURGERY N/A 1978   POLYPECTOMY     TIBIA FRACTURE SURGERY Left 2012   TONSILLECTOMY AND ADENOIDECTOMY     TOTAL KNEE ARTHROPLASTY Left 06/08/2021   Procedure: TOTAL KNEE ARTHROPLASTY;  Surgeon: Kennedy Bucker, MD;  Location: [facility];  Service: Orthopedics;  Laterality: Left;   TOTAL KNEE ARTHROPLASTY Right 01/19/2023   Procedure: TOTAL KNEE ARTHROPLASTY;  Surgeon: Reinaldo Berber, MD;  Location: [facility];  Service: Orthopedics;  Laterality: Right;   UVULOPLASTY N/A 2000    Social History Social History   Tobacco Use   Smoking status: Former    Current packs/day: 0.00    Average packs/day: 1 pack/day for 20.0 years (20.0 ttl pk-yrs)    Types: Cigarettes    Start date: 07/24/1959    Quit date: 07/24/1979    Years since quitting: 44.3   Smokeless tobacco: Former    Quit date: 11/01/1979  Vaping Use   Vaping status: Never Used  Substance Use Topics   Alcohol use: Yes    Comment: wine 2-3xs a year   Drug use: No    Family History Family History  Problem Relation Age of Onset   Colon cancer Brother 72   Colon cancer Maternal Aunt 69   Colon cancer Maternal Aunt 26   Kidney disease Mother    Anemia Mother    Diabetes Brother    Hyperlipidemia Brother    Hypertension Brother    Colon polyps Brother    Esophageal cancer Neg Hx    Rectal cancer Neg Hx    Stomach cancer Neg Hx     Allergies  Allergen Reactions   Ace Inhibitors Anaphylaxis and Other (See Comments)    Swelling of tongue     REVIEW OF SYSTEMS (Negative unless checked)  Constitutional: [] Weight loss  [] Fever  [] Chills Cardiac: [] Chest pain   [] Chest pressure   [] Palpitations   [] Shortness of breath when laying flat   [] Shortness of breath with exertion. Vascular:  [] Pain in legs with walking   [x] Pain  in legs with standing  [] History of DVT   [] Phlebitis   [x] Swelling in legs   [] Varicose veins   [] Non-healing ulcers Pulmonary:   [] Uses home oxygen   [] Productive cough   [] Hemoptysis   [] Wheeze  [] COPD   [] Asthma Neurologic:  [] Dizziness   [] Seizures   [] History of stroke   [] History of TIA  [] Aphasia   [] Vissual changes   []   Weakness or numbness in arm   [] Weakness or numbness in leg Musculoskeletal:   [] Joint swelling   [] Joint pain   [] Low back pain Hematologic:  [] Easy bruising  [] Easy bleeding   [] Hypercoagulable state   [] Anemic Gastrointestinal:  [] Diarrhea   [] Vomiting  [] Gastroesophageal reflux/heartburn   [] Difficulty swallowing. Genitourinary:  [] Chronic kidney disease   [] Difficult urination  [] Frequent urination   [] Blood in urine Skin:  [] Rashes   [] Ulcers  Psychological:  [] History of anxiety   []  History of major depression.  Physical Examination  Vitals:   11/16/23 1532  BP: 132/79  Pulse: 72  Resp: 16  Weight: 243 lb (110.2 kg)   Body mass index is 35.88 kg/m. Gen: WD/WN, NAD Head: [place]/AT, No temporalis wasting.  Ear/Nose/Throat: Hearing grossly intact, nares w/o erythema or drainage, pinna without lesions Eyes: PER, EOMI, sclera nonicteric.  Neck: Supple, no gross masses.  No JVD.  Pulmonary:  Good air movement, no audible wheezing, no use of accessory muscles.  Cardiac: RRR, precordium not hyperdynamic. Vascular:  scattered varicosities present bilaterally.  Mild venous stasis changes to the legs bilaterally.  3-4+ soft pitting edema, CEAP C4sEpAsPr  Vessel Right Left  Radial Palpable Palpable  Gastrointestinal: soft, non-distended. No guarding/no peritoneal signs.  Musculoskeletal: M/S 5/5 throughout.  No deformity.  Neurologic: CN 2-12 intact. Pain and light touch intact in extremities.  Symmetrical.  Speech is fluent. Motor exam as listed above. Psychiatric: Judgment intact, Mood & affect appropriate for pt's clinical situation. Dermatologic: Venous rashes  no ulcers noted.  No changes consistent with cellulitis. Lymph : No lichenification or skin changes of chronic lymphedema.  CBC Lab Results  Component Value Date   WBC 4.3 03/26/2023   HGB 13.1 03/26/2023   HCT 41.0 03/26/2023   MCV 94.9 03/26/2023   PLT 196 03/26/2023    BMET    Component Value Date/Time   NA 139 03/26/2023 0742   NA 143 08/10/2022 1333   NA 142 12/06/2012 0426   K 4.0 03/26/2023 0742   K 4.3 12/06/2012 0426   CL 111 03/26/2023 0742   CL 114 (H) 12/06/2012 0426   CO2 22 03/26/2023 0742   CO2 20 (L) 12/06/2012 0426   GLUCOSE 152 (H) 03/26/2023 0742   GLUCOSE 327 (H) 12/06/2012 0426   BUN 15 03/26/2023 0742   BUN 14 08/10/2022 1333   BUN 35 (H) 12/06/2012 0426   CREATININE 0.90 08/30/2023 1031   CREATININE 1.26 12/06/2012 0426   CALCIUM 10.3 03/26/2023 0742   CALCIUM 10.1 12/06/2012 0426   GFRNONAA >60 03/26/2023 0742   GFRNONAA 57 (L) 12/06/2012 0426   GFRAA 88 05/21/2020 0812   GFRAA >60 12/06/2012 0426   CrCl cannot be calculated (Patient's most recent lab result is older than the maximum 21 days allowed.).  COAG No results found for: "INR", "PROTIME"  Radiology VAS [place] LOWER EXTREMITY VENOUS REFLUX Result Date: 11/17/2023  Lower Venous Reflux Study Patient Name:  Zachary Martinez  Date of Exam:   11/16/2023 Medical Rec #: [id number]     Accession #:    [id number] Date of Birth: 08/02/1941     Patient Gender: M Patient Age:   16 years Exam Location:  [facility] Procedure:      VAS [place] LOWER EXTREMITY VENOUS REFLUX Referring Phys: Sheppard Plumber --------------------------------------------------------------------------------  Indications: Swelling, and Pain.  Performing Technologist: Hardie Lora RVT  Examination Guidelines: A complete evaluation includes B-mode imaging, spectral Doppler, color Doppler, and power Doppler as needed  of all accessible portions of each vessel. Bilateral testing is considered an integral part of a complete examination.  Limited examinations for reoccurring indications may be performed as noted. The reflux portion of the exam is performed with the patient in reverse Trendelenburg. Significant venous reflux is defined as >500 ms in the superficial venous system, and >1 second in the deep venous system.  Venous Reflux Times +--------------+---------+------+-----------+------------+--------+ RIGHT         Reflux NoRefluxReflux TimeDiameter cmsComments                         Yes                                  +--------------+---------+------+-----------+------------+--------+ CFV           no                                             +--------------+---------+------+-----------+------------+--------+ FV prox       no                                             +--------------+---------+------+-----------+------------+--------+ FV mid        no                                             +--------------+---------+------+-----------+------------+--------+ FV dist       no                                             +--------------+---------+------+-----------+------------+--------+ Popliteal     no                                             +--------------+---------+------+-----------+------------+--------+ GSV at [facility]    no                            0.66             +--------------+---------+------+-----------+------------+--------+ GSV prox thighno                            0.48             +--------------+---------+------+-----------+------------+--------+ GSV mid thigh no                            0.49             +--------------+---------+------+-----------+------------+--------+ GSV dist thighno                            0.55             +--------------+---------+------+-----------+------------+--------+ GSV at knee   no  0.41             +--------------+---------+------+-----------+------------+--------+ GSV prox  calf no                            0.34             +--------------+---------+------+-----------+------------+--------+ SSV Pop Fossa no                            0.24             +--------------+---------+------+-----------+------------+--------+ SSV prox calf no                            0.22             +--------------+---------+------+-----------+------------+--------+ SSV mid calf  no                            0.28             +--------------+---------+------+-----------+------------+--------+  +--------------+---------+------+-----------+------------+--------+ LEFT          Reflux NoRefluxReflux TimeDiameter cmsComments                         Yes                                  +--------------+---------+------+-----------+------------+--------+ CFV           no                                             +--------------+---------+------+-----------+------------+--------+ FV prox       no                                             +--------------+---------+------+-----------+------------+--------+ FV mid        no                                             +--------------+---------+------+-----------+------------+--------+ FV dist       no                                             +--------------+---------+------+-----------+------------+--------+ Popliteal     no                                             +--------------+---------+------+-----------+------------+--------+ GSV at [facility]    no                            0.58             +--------------+---------+------+-----------+------------+--------+ GSV prox thighno  0.56             +--------------+---------+------+-----------+------------+--------+ GSV mid thigh no                            0.50             +--------------+---------+------+-----------+------------+--------+ GSV dist thighno                            0.56              +--------------+---------+------+-----------+------------+--------+ GSV at knee   no                            0.55             +--------------+---------+------+-----------+------------+--------+ GSV prox calf no                            0.43             +--------------+---------+------+-----------+------------+--------+ SSV Pop Fossa no                            0.27             +--------------+---------+------+-----------+------------+--------+ SSV prox calf no                            0.41             +--------------+---------+------+-----------+------------+--------+ SSV mid calf  no                            0.25             +--------------+---------+------+-----------+------------+--------+   Summary: Right: - No evidence of deep vein thrombosis seen in the right lower extremity, from the common femoral through the popliteal veins. - No evidence of superficial venous thrombosis in the right lower extremity. - No evidence of superficial venous reflux seen in the right greater saphenous vein. - No evidence of superficial venous reflux seen in the right short saphenous vein.  Left: - No evidence of deep vein thrombosis seen in the left lower extremity, from the common femoral through the popliteal veins. - No evidence of superficial venous thrombosis in the left lower extremity. - No evidence of superficial venous reflux seen in the left greater saphenous vein. - No evidence of superficial venous reflux seen in the left short saphenous vein.  *See table(s) above for measurements and observations. Electronically signed by Levora Dredge MD on 11/17/2023 at 10:41:06 AM.    Final      Assessment/Plan 1. Lymphedema (Primary) Recommend:  No surgery or intervention at this point in time.   The Patient is CEAP C4sEpAsPr.  The patient has been wearing compression for more than 12 weeks with no or little benefit.  The patient has been exercising daily for  more than 12 weeks. The patient has been elevating and taking OTC pain medications for more than 12 weeks.  None of these have have eliminated the pain related to the lymphedema or the discomfort regarding excessive swelling and venous congestion.    I have reviewed my discussion with the patient regarding lymphedema and why it  causes symptoms.  Patient will continue  wearing graduated compression on a daily basis. The patient should put the compression on first thing in the morning and removing them in the evening. The patient should not sleep in the compression.   In addition, behavioral modification throughout the day will be continued.  This will include frequent elevation (such as in a recliner), use of over the counter pain medications as needed and exercise such as walking.  The systemic causes for chronic edema such as liver, kidney and cardiac etiologies do not appear to have significant changed over the past year.    The patient has chronic , severe lymphedema with hyperpigmentation of the skin and has done MLD, skin care, medication, diet, exercise, elevation and compression for 4 weeks with no improvement,  I am recommending a lymphedema pump.  The patient still has stage 3 lymphedema and therefore, I believe that a lymph pump is needed to improve the control of the patient's lymphedema and improve the quality of life.  Additionally, a lymph pump is warranted because it will reduce the risk of cellulitis and ulceration in the future.  Patient should follow-up in six months   2. Other hyperlipidemia Continue statin as ordered and reviewed, no changes at this time  3. Type 2 diabetes mellitus with diabetic neuropathy, without long-term current use of insulin (HCC) Continue hypoglycemic medications as already ordered, these medications have been reviewed and there are no changes at this time.  Hgb A1C to be monitored as already arranged by primary service  4. CAD in native  artery Continue cardiac and antihypertensive medications as already ordered and reviewed, no changes at this time.  Continue statin as ordered and reviewed, no changes at this time  Nitrates PRN for chest pain  5. Primary hypertension Continue antihypertensive medications as already ordered, these medications have been reviewed and there are no changes at this time. d   Levora Dredge, MD  11/18/2023 2:13 PM

## 2024-03-19 ENCOUNTER — Encounter (INDEPENDENT_AMBULATORY_CARE_PROVIDER_SITE_OTHER): Payer: Self-pay

## 2024-07-18 ENCOUNTER — Other Ambulatory Visit: Payer: Self-pay | Admitting: Physician Assistant

## 2024-07-18 DIAGNOSIS — N401 Enlarged prostate with lower urinary tract symptoms: Secondary | ICD-10-CM

## 2024-08-20 ENCOUNTER — Telehealth: Payer: Self-pay | Admitting: Physician Assistant

## 2024-08-21 NOTE — Telephone Encounter (Signed)
 Zachary Martinez

## 2024-09-20 ENCOUNTER — Ambulatory Visit: Payer: Self-pay | Admitting: Urology

## 2024-09-20 ENCOUNTER — Encounter: Payer: Self-pay | Admitting: Urology

## 2024-09-20 VITALS — BP 150/72 | HR 78 | Ht 69.0 in | Wt 240.0 lb

## 2024-09-20 DIAGNOSIS — D1771 Benign lipomatous neoplasm of kidney: Secondary | ICD-10-CM | POA: Diagnosis not present

## 2024-09-20 DIAGNOSIS — N401 Enlarged prostate with lower urinary tract symptoms: Secondary | ICD-10-CM | POA: Diagnosis not present

## 2024-09-20 DIAGNOSIS — R350 Frequency of micturition: Secondary | ICD-10-CM

## 2024-09-20 DIAGNOSIS — N200 Calculus of kidney: Secondary | ICD-10-CM

## 2024-09-20 LAB — BLADDER SCAN AMB NON-IMAGING: Scan Result: 0

## 2024-09-20 NOTE — Progress Notes (Signed)
 09/20/2024 10:22 AM   Zachary Martinez May 10, 1941 [id number]  Referring provider: Bertrum Charlie CROME, MD [address] [place],  [place] [address]  Chief Complaint  Patient presents with   Benign Prostatic Hypertrophy   Urologic history 1. BPH with LUTS    2. Nephrolithiasis 4mm nonobstructing right renal calculus.    3. Renal cyst CT with bilateral simple renal cyst and left parapelvic cyst.    4. Right renal angiomyolipoma 8 mm right lower pole angiomyelopoma   HPI: Zachary Martinez is a 83 y.o. male presents for annual follow-up  Stable symptoms since last year's visit.  His most bothersome complaint is urgency upon standing with occasional episodes of urge incontinence.   Remains on tamsulosin , no longer taking Gemtesa  No flank, abdominal or pelvic pain Denies dysuria, gross hematuria   PMH: Past Medical History:  Diagnosis Date   Angiomyolipoma of right kidney    Arthritis of both knees    Bilateral carotid artery disease    Bradycardia    CAD (coronary artery disease) 02/13/2013   a.) LHC 02/13/2013: 30% dLM, 40% oLAD, 50% pLAD, 40% D1, 30% pLCx, 60% mLCx-1, 20% mLCx-2, 60% OM1, 20% pRCA, 70% RPDA -- med mgmt; b.) MPI 06/29/2020: EF 50%, no ishemia/scar.   Cataract    a.) s/p extraction with IOL placement on RIGHT; b.) forming in LEFT eye   Diabetic polyneuropathy (HCC)    Diastolic dysfunction    a.) TTE 07/04/2017: EF 50%, mod LVH, mild MAC, mod BAE, mod RVE, mild PR, mod MR/TR, G1DD.   Diverticulosis    GERD (gastroesophageal reflux disease)    Hepatic steatosis    Hiatal hernia    Hyperlipidemia    Hypertension    Hypothyroidism    Left inguinal hernia    Lumbar spondylosis    Myelolipoma of right adrenal gland 04/05/2020   a.) CT abd 04/06/2011: measures 2.9 cm; b.) CT A/P 08/12/2022: measured 5.7 cm   Nephrolithiasis    OSA on CPAP    Skin cancer    chest and ears   Splenomegaly    Spondylolisthesis at L5-S1 level    T2DM (type 2 diabetes  mellitus) (HCC)    Valvular heart disease    a.) TTE 12/01/2014: mod MR/TR, sev PR; b.) TTE 07/04/2017: mild PR, mod MR/TR; c.) TTE 06/29/2020: mild MR/TR/PR   Venous insufficiency of both lower extremities     Surgical History: Past Surgical History:  Procedure Laterality Date   CATARACT EXTRACTION W/PHACO Right 11/01/2016   Procedure: CATARACT EXTRACTION PHACO AND INTRAOCULAR LENS PLACEMENT (IOC);  Surgeon: Elsie Carmine, MD;  Location: [facility];  Service: Ophthalmology;  Laterality: Right;  Lot #[id number] H US : oo:35.4 AP%; 22.4 CDE: 7.91   COLONOSCOPY     CYST REMOVAL NECK     EYE SURGERY Right    to remove blood from busted blood vessel   HARDWARE REMOVAL Left 04/01/2021   Procedure: Screw removal from left proximal tibia;  Surgeon: Kathlynn Sharper, MD;  Location: [facility];  Service: Orthopedics;  Laterality: Left;   INGUINAL HERNIA REPAIR     KNEE ARTHROSCOPY Right 2003   KNEE ARTHROSCOPY Left 2013   KNEE ARTHROSCOPY Right 2005   LEFT HEART CATH AND CORONARY ANGIOGRAPHY N/A 02/13/2013   Procedure: LEFT HEART CATH AND CORONARY ANGIOGRAPHY; Location: [facility]; Surgeon: Wolm Rhyme, MD   NASAL POLYP SURGERY N/A 1978   POLYPECTOMY     TIBIA FRACTURE SURGERY Left 2012   TONSILLECTOMY AND ADENOIDECTOMY  TOTAL KNEE ARTHROPLASTY Left 06/08/2021   Procedure: TOTAL KNEE ARTHROPLASTY;  Surgeon: Kathlynn Sharper, MD;  Location: [facility];  Service: Orthopedics;  Laterality: Left;   TOTAL KNEE ARTHROPLASTY Right 01/19/2023   Procedure: TOTAL KNEE ARTHROPLASTY;  Surgeon: Lorelle Hussar, MD;  Location: [facility];  Service: Orthopedics;  Laterality: Right;   UVULOPLASTY N/A 2000    Home Medications:  Allergies as of 09/20/2024       Reactions   Ace Inhibitors Anaphylaxis, Other (See Comments)   Swelling of tongue        Medication List        Accurate as of September 20, 2024 10:22 AM. If you have any questions, ask your nurse or doctor.          STOP taking these  medications    aspirin EC 81 MG tablet Stopped by: Glendia JAYSON Barba   enoxaparin  40 MG/0.4ML injection Commonly known as: LOVENOX  Stopped by: Glendia JAYSON Barba   EPINEPHrine  0.3 mg/0.3 mL Soaj injection Commonly known as: EPI-PEN Stopped by: Glendia JAYSON Barba   Gemtesa  75 MG Tabs Generic drug: Vibegron  Stopped by: Glendia JAYSON Barba   Invokamet  [phone] MG Tabs Generic drug: Canagliflozin -metFORMIN  HCl Stopped by: Glendia JAYSON Barba   ondansetron  4 MG tablet Commonly known as: ZOFRAN  Stopped by: Glendia JAYSON Barba   potassium chloride 10 MEQ tablet Commonly known as: KLOR-CON Stopped by: Glendia JAYSON Barba       TAKE these medications    amLODipine  10 MG tablet Commonly known as: NORVASC  Take 1 tablet (10 mg total) by mouth daily.   atorvastatin  40 MG tablet Commonly known as: LIPITOR TAKE 1 TABLET EVERY DAY What changed: when to take this   carvedilol  6.25 MG tablet Commonly known as: COREG  Take 1 tablet (6.25 mg total) by mouth 2 (two) times daily with a meal.   hydrALAZINE  100 MG tablet Commonly known as: APRESOLINE  Take 1 tablet (100 mg total) by mouth 2 (two) times daily.   levothyroxine  88 MCG tablet Commonly known as: SYNTHROID  TAKE 1 TABLET EVERY DAY BEFORE BREAKFAST What changed: See the new instructions.   pioglitazone  30 MG tablet Commonly known as: ACTOS  Take 1 tablet (30 mg total) by mouth every morning.   potassium chloride 10 MEQ tablet Commonly known as: KLOR-CON M Take 10 mEq by mouth 2 (two) times daily.   tamsulosin  0.4 MG Caps capsule Commonly known as: FLOMAX  TAKE 1 CAPSULE BY MOUTH ONCE DAILY        Allergies:  Allergies  Allergen Reactions   Ace Inhibitors Anaphylaxis and Other (See Comments)    Swelling of tongue    Family History: Family History  Problem Relation Age of Onset   Colon cancer Brother 41   Colon cancer Maternal Aunt 59   Colon cancer Maternal Aunt 7   Kidney disease Mother    Anemia Mother    Diabetes  Brother    Hyperlipidemia Brother    Hypertension Brother    Colon polyps Brother    Esophageal cancer Neg Hx    Rectal cancer Neg Hx    Stomach cancer Neg Hx     Social History:  reports that he quit smoking about 45 years ago. His smoking use included cigarettes. He started smoking about 65 years ago. He has a 20 pack-year smoking history. He quit smokeless tobacco use about 44 years ago. He reports current alcohol use. He reports that he does not use drugs.   Physical Exam: BP (!) 150/72  Pulse 78   Ht 5' 9 (1.753 m)   Wt 240 lb (108.9 kg)   BMI 35.44 kg/m   Constitutional:  Alert, No acute distress. HEENT: [place] AT Respiratory: Normal respiratory effort, no increased work of breathing. Psychiatric: Normal mood and affect.    Assessment & Plan:    1. Benign prostatic hyperplasia with urinary frequency  PVR today 0 mL Urgency not bothersome enough that he desires medical management He was inquiring if he needed to continue tamsulosin  as he is trying to decrease some of his medications.  We discussed he may stop the tamsulosin  however if his voiding symptoms worsen he will restart Continue annual follow-up  2.  Right nephrolithiasis Asymptomatic RUS 1 year  3.  Renal angiomyolipoma Asymptomatic RUS 1 year   Glendia JAYSON Barba, MD  [facility] [address] [place], [place] [address] [phone]

## 2024-10-29 ENCOUNTER — Other Ambulatory Visit: Payer: Self-pay | Admitting: *Deleted

## 2024-10-29 ENCOUNTER — Ambulatory Visit
Admission: RE | Admit: 2024-10-29 | Discharge: 2024-10-29 | Disposition: A | Discharge status: Home / Self Care | Source: Ambulatory Visit | Attending: Urology | Admitting: Urology

## 2024-10-29 ENCOUNTER — Telehealth: Payer: Self-pay | Admitting: Urology

## 2024-10-29 DIAGNOSIS — N200 Calculus of kidney: Secondary | ICD-10-CM | POA: Diagnosis present

## 2024-10-29 DIAGNOSIS — N401 Enlarged prostate with lower urinary tract symptoms: Secondary | ICD-10-CM

## 2024-10-29 MED ORDER — TAMSULOSIN HCL 0.4 MG PO CAPS: 0.4000 mg | ORAL_CAPSULE | Freq: Every day | ORAL | 3 refills | Status: AC

## 2024-10-29 NOTE — Telephone Encounter (Signed)
 Pt came into the office and said that when he saw [facility] he said that if he wanted to continue his Tamsulosin , he would call it in again. He has been off of it for about a month and now he is having trouble urinating and he would like another prescription called into [facility] in [place].

## 2024-11-10 ENCOUNTER — Ambulatory Visit: Payer: Self-pay | Admitting: Urology

## 2024-12-04 ENCOUNTER — Other Ambulatory Visit: Payer: Self-pay

## 2024-12-04 ENCOUNTER — Emergency Department

## 2024-12-04 ENCOUNTER — Emergency Department
Admission: EM | Admit: 2024-12-04 | Discharge: 2024-12-04 | Disposition: A | Discharge status: Home / Self Care | Attending: Emergency Medicine | Admitting: Emergency Medicine

## 2024-12-04 DIAGNOSIS — W000XXA Fall on same level due to ice and snow, initial encounter: Secondary | ICD-10-CM | POA: Insufficient documentation

## 2024-12-04 DIAGNOSIS — S63501A Unspecified sprain of right wrist, initial encounter: Secondary | ICD-10-CM | POA: Insufficient documentation

## 2024-12-04 DIAGNOSIS — W19XXXA Unspecified fall, initial encounter: Secondary | ICD-10-CM

## 2024-12-04 DIAGNOSIS — E039 Hypothyroidism, unspecified: Secondary | ICD-10-CM | POA: Insufficient documentation

## 2024-12-04 DIAGNOSIS — E119 Type 2 diabetes mellitus without complications: Secondary | ICD-10-CM | POA: Insufficient documentation

## 2024-12-04 NOTE — ED Triage Notes (Addendum)
 C/O slipping and falling on snow 8 days ago.  C/O right wrist injury and pain. Has been taking advil , which helps pain, but wrist remains painful. C?O difficulty with gripping things, all other movement unaffected.  Radial pulse palpable. + CMS to extremity.

## 2024-12-04 NOTE — ED Provider Notes (Signed)
 "  [facility] Provider Note    Event Date/Time   First MD Initiated Contact with Patient 12/04/24 [phone]     (approximate)   History   Wrist Injury   HPI  Zachary Martinez is a 84 y.o. male history of diabetes, hypothyroidism, kidney stones, arthritis, and multiple other past medical problems, see past medical history, presents emergency department complaining of a fall on the ice about 8 days ago.  States the wrist is continued to hurt and has some swelling.  No numbness or tingling.  No other injuries.  Was not able to get out and get his wrist checked due to the conditions of the road.      Physical Exam   Triage Vital Signs: ED Triage Vitals  Encounter Vitals Group     BP 12/04/24 0816 (!) 162/97     Girls Systolic BP Percentile --      Girls Diastolic BP Percentile --      Boys Systolic BP Percentile --      Boys Diastolic BP Percentile --      Pulse Rate 12/04/24 0816 65     Resp 12/04/24 0816 16     Temp 12/04/24 0816 97.7 F (36.5 C)     Temp Source 12/04/24 0816 Oral     SpO2 12/04/24 0816 97 %     Weight 12/04/24 0817 240 lb 1.3 oz (108.9 kg)     Height --      Head Circumference --      Peak Flow --      Pain Score 12/04/24 0816 5     Pain Loc --      Pain Education --      Exclude from Growth Chart --     Most recent vital signs: Vitals:   12/04/24 0816  BP: (!) 162/97  Pulse: 65  Resp: 16  Temp: 97.7 F (36.5 C)  SpO2: 97%     General: Awake, no distress.   CV:  Good peripheral perfusion.  Resp:  Normal effort.  Abd:  No distention.   Other:  Right wrist with swelling, tenderness at the distal radius, carpal bones, no snuffbox tenderness, neurovascular intact, full range of motion   ED Results / Procedures / Treatments   Labs (all labs ordered are listed, but only abnormal results are displayed) Labs Reviewed - No data to display   EKG     RADIOLOGY X-ray right  wrist    PROCEDURES:   Procedures  Critical Care:  no Chief Complaint  Patient presents with   Wrist Injury      MEDICATIONS ORDERED IN ED: Medications - No data to display   IMPRESSION / MDM / ASSESSMENT AND PLAN / ED COURSE  I reviewed the triage vital signs and the nursing notes.                              Differential diagnosis includes, but is not limited to, sprain, fracture, contusion  Patient's presentation is most consistent with acute illness / injury with system symptoms.   X-ray right wrist, independent review interpretation by me as being negative for fracture, confirmed by radiology  I did explain the findings to patient.  He is placed in a wrist brace with thumb abductor, follow-up with orthopedics.  Return emergency department worsening.  Continue the ibuprofen .  Could also add Tylenol  as desired.  He is in agreement with  treatment plan.  He was discharged stable condition.      FINAL CLINICAL IMPRESSION(S) / ED DIAGNOSES   Final diagnoses:  Right wrist sprain, initial encounter  Fall, initial encounter     Rx / DC Orders   ED Discharge Orders     None        Note:  This document was prepared using Dragon voice recognition software and may include unintentional dictation errors.    Gasper Devere ORN, PA-C 12/04/24 [id number]    Jacolyn Pae, MD 12/04/24 1525  "

## 2025-09-19 ENCOUNTER — Ambulatory Visit: Admitting: Urology
